# Patient Record
Sex: Male | Born: 1950
Health system: Southern US, Community
[De-identification: ages and names within clinical notes are randomized; demographics above are authoritative.]

## PROBLEM LIST (undated history)

## (undated) DIAGNOSIS — M5416 Radiculopathy, lumbar region: Secondary | ICD-10-CM

## (undated) DIAGNOSIS — N528 Other male erectile dysfunction: Secondary | ICD-10-CM

## (undated) DIAGNOSIS — N410 Acute prostatitis: Secondary | ICD-10-CM

## (undated) DIAGNOSIS — R7402 Elevation of levels of lactic acid dehydrogenase (LDH): Secondary | ICD-10-CM

## (undated) DIAGNOSIS — N400 Enlarged prostate without lower urinary tract symptoms: Secondary | ICD-10-CM

## (undated) DIAGNOSIS — N138 Other obstructive and reflux uropathy: Secondary | ICD-10-CM

## (undated) DIAGNOSIS — R7401 Elevation of levels of liver transaminase levels: Secondary | ICD-10-CM

## (undated) DIAGNOSIS — E785 Hyperlipidemia, unspecified: Secondary | ICD-10-CM

## (undated) DIAGNOSIS — R634 Abnormal weight loss: Secondary | ICD-10-CM

## (undated) DIAGNOSIS — M545 Low back pain, unspecified: Secondary | ICD-10-CM

## (undated) DIAGNOSIS — K635 Polyp of colon: Secondary | ICD-10-CM

## (undated) DIAGNOSIS — J019 Acute sinusitis, unspecified: Secondary | ICD-10-CM

## (undated) DIAGNOSIS — K219 Gastro-esophageal reflux disease without esophagitis: Secondary | ICD-10-CM

## (undated) DIAGNOSIS — K5904 Chronic idiopathic constipation: Secondary | ICD-10-CM

## (undated) DIAGNOSIS — K2100 Gastro-esophageal reflux disease with esophagitis, without bleeding: Secondary | ICD-10-CM

## (undated) DIAGNOSIS — K432 Incisional hernia without obstruction or gangrene: Secondary | ICD-10-CM

## (undated) DIAGNOSIS — R21 Rash and other nonspecific skin eruption: Secondary | ICD-10-CM

## (undated) HISTORY — DX: Elevation of levels of liver transaminase levels: R74.01

## (undated) HISTORY — DX: Other male erectile dysfunction: N52.8

## (undated) HISTORY — PX: HAND SURGERY: SHX662

## (undated) HISTORY — DX: Chronic idiopathic constipation: K59.04

## (undated) HISTORY — DX: Abnormal weight loss: R63.4

## (undated) HISTORY — DX: Gastro-esophageal reflux disease with esophagitis, without bleeding: K21.00

## (undated) HISTORY — DX: Hyperlipidemia, unspecified: E78.5

## (undated) HISTORY — DX: Elevation of levels of lactic acid dehydrogenase (LDH): R74.02

## (undated) HISTORY — DX: Rash and other nonspecific skin eruption: R21

## (undated) HISTORY — DX: Acute sinusitis, unspecified: J01.90

## (undated) HISTORY — DX: Radiculopathy, lumbar region: M54.16

## (undated) HISTORY — DX: Low back pain, unspecified: M54.50

## (undated) HISTORY — DX: Other obstructive and reflux uropathy: N13.8

## (undated) HISTORY — PX: SPINAL CORD DECOMPRESSION: SHX97

## (undated) HISTORY — DX: Acute prostatitis: N41.0

## (undated) HISTORY — DX: Incisional hernia without obstruction or gangrene: K43.2

---
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
## 1998-07-17 ENCOUNTER — Emergency Department (HOSPITAL_COMMUNITY): Admission: EM | Admit: 1998-07-17 | Discharge: 1998-07-17 | Payer: Self-pay | Admitting: Emergency Medicine

## 2002-01-25 ENCOUNTER — Emergency Department (HOSPITAL_COMMUNITY): Admission: EM | Admit: 2002-01-25 | Discharge: 2002-01-25 | Payer: Self-pay | Admitting: Internal Medicine

## 2002-01-25 ENCOUNTER — Encounter: Payer: Self-pay | Admitting: Internal Medicine

## 2002-01-31 ENCOUNTER — Ambulatory Visit (HOSPITAL_COMMUNITY): Admission: RE | Admit: 2002-01-31 | Discharge: 2002-01-31 | Payer: Self-pay | Admitting: Pulmonary Disease

## 2002-01-31 IMAGING — CT CT ABDOMEN W/ CM
1 of 2 series · 15 of 32 positions shown, 20 images · IV contrast (CONTRAST)
Comparison: none

FINDINGS
CLINICAL DATA: RIGHT-SIDED ABDOMINAL PAIN
CT ABDOMEN WITH IV CONTRAST
SCANS WERE OBTAINED DURING IV CONTRAST ENHANCEMENT WITH 100 CC OF OMNIPAQUE 300 INJECTED AT 2
CC/SEC.
THERE ARE SEVERAL SMALL (5-6 MM IN SIZE) RIGHT LOBE HEPATIC CYSTS.  THE LIVER OTHERWISE HAS A
NORMAL APPEARANCE.  THE GALLBLADDER AS VISUALIZED BY CT HAS A NORMAL APPEARANCE.  THE PANCREAS,
SPLEEN, KIDNEYS, AND ADRENAL GLANDS HAVE A NORMAL APPEARANCE.  THERE IS NO RETROPERITONEAL
ADENOPATHY.  CONSIDERABLE STOOL IS SEEN WITHIN THE COLON.
IMPRESSION
SEVERAL SMALL RIGHT LOBE HEPATIC CYSTS.  OTHERWISE NORMAL STUDY AND NO FINDINGS TO ACCOUNT FOR THE
PATIENT'S ABDOMINAL PAIN.
CT PELVIS WITH IV CONTRAST
THERE IS NO EVIDENCE FOR A PELVIC MASS.  THERE IS NO ADENOPATHY.  THERE IS NO FREE PELVIC FLUID.
THE APPENDIX IS DIFFICULT TO VISUALIZE WITH CERTAINTY.  HOWEVER, THERE IS NO CT SCAN EVIDENCE FOR
APPENDICITIS.
NEGATIVE CT OF THE PELVIS.

[Series 8783: — · axial · 0.66mm/px · z∈[+1450,+1810]mm · 15 of 80 slices shown, 20 images]
[im 4/80  soft-tissue]
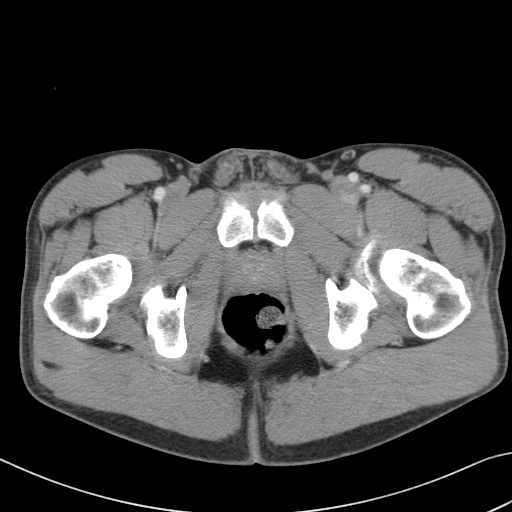
[im 4/80  bone]
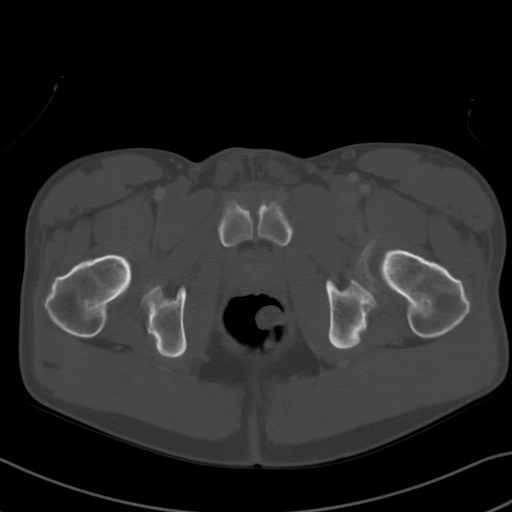
[im 11/80  soft-tissue]
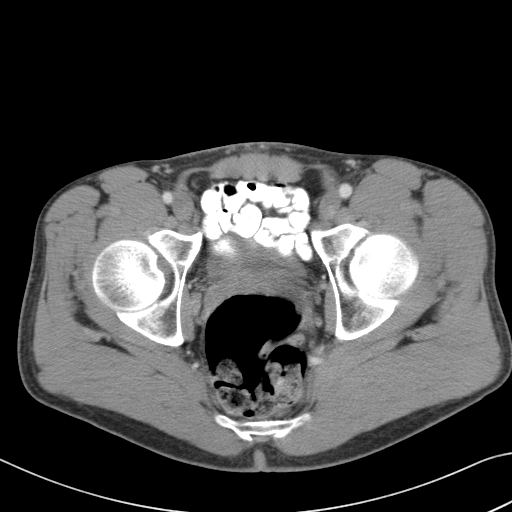
[im 15/80  soft-tissue]
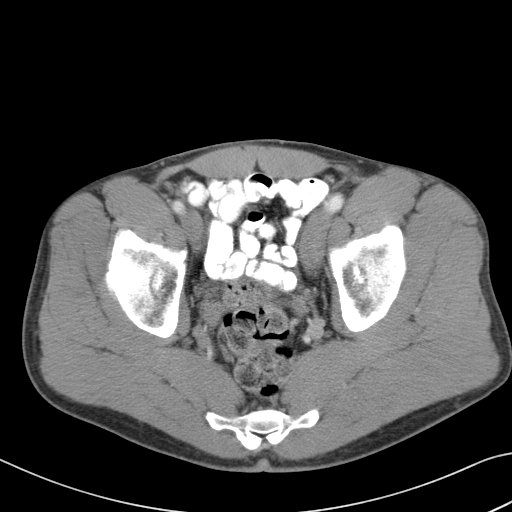
[im 22/80  soft-tissue]
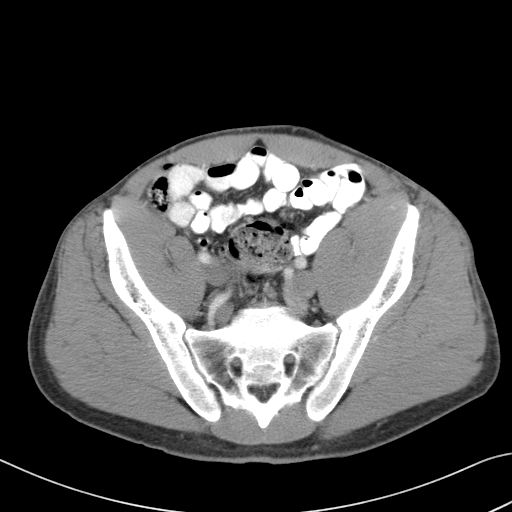
[im 26/80  soft-tissue]
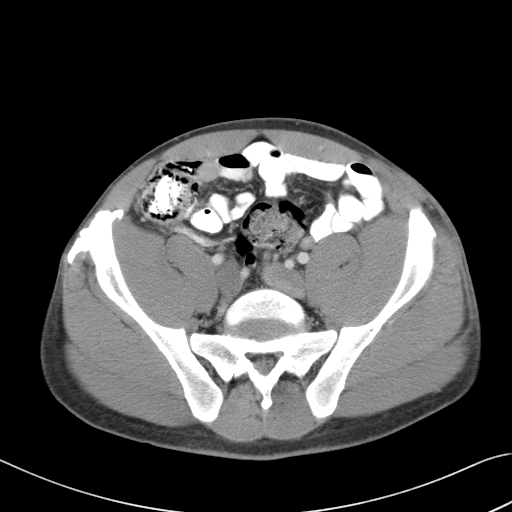
[im 33/80  soft-tissue]
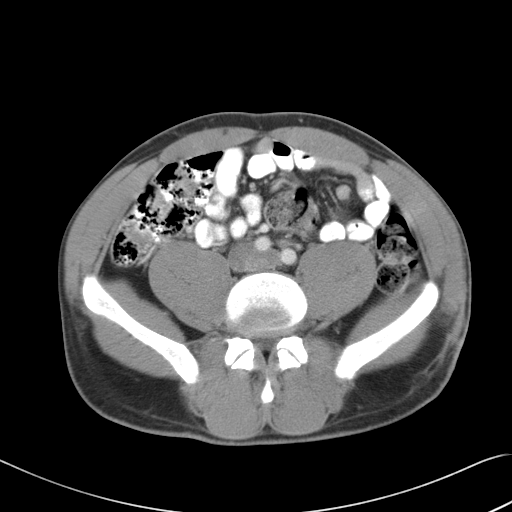
[im 36/80  soft-tissue]
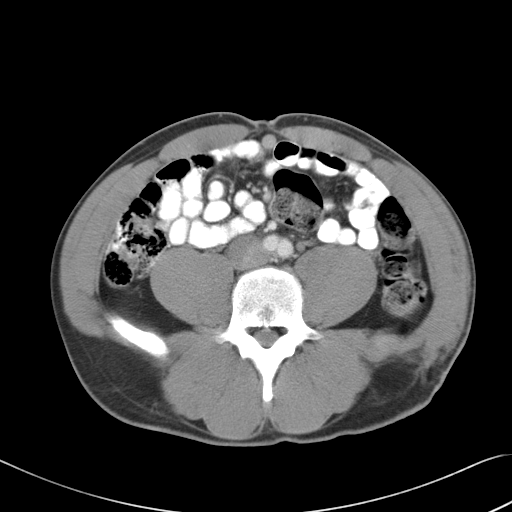
[im 44/80  soft-tissue]
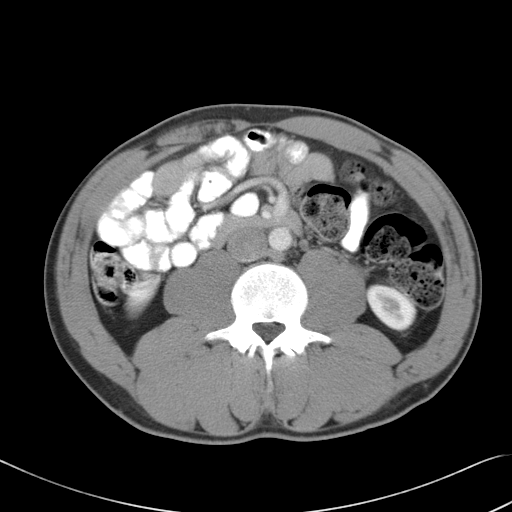
[im 47/80  soft-tissue]
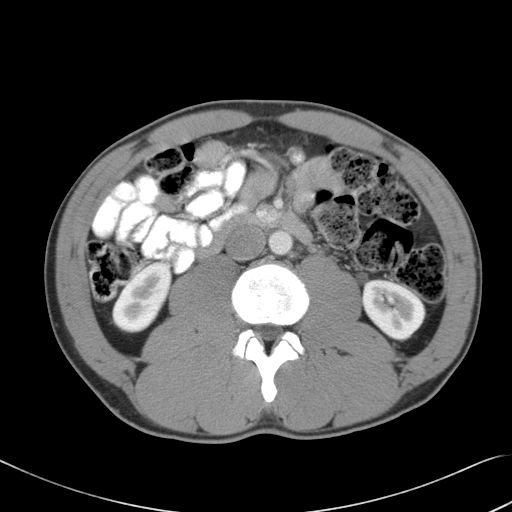
[im 47/80  bone]
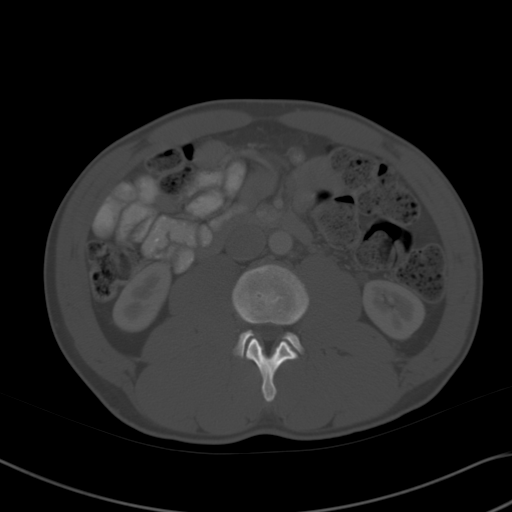
[im 54/80  soft-tissue]
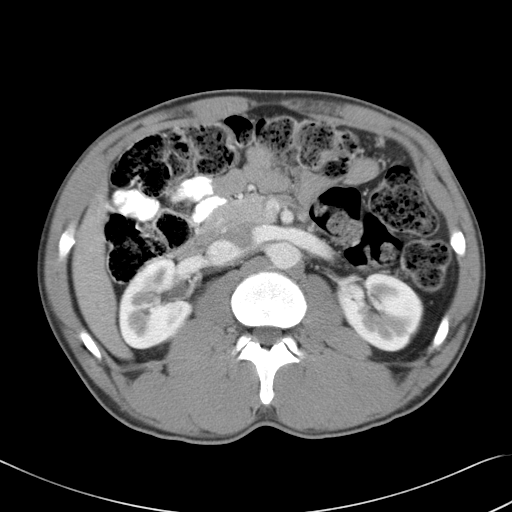
[im 58/80  soft-tissue]
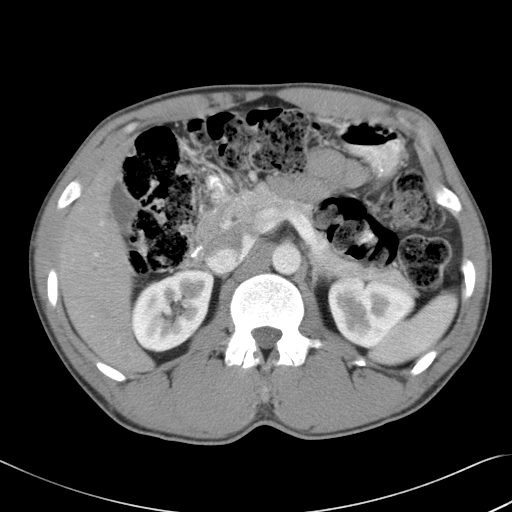
[im 65/80  soft-tissue]
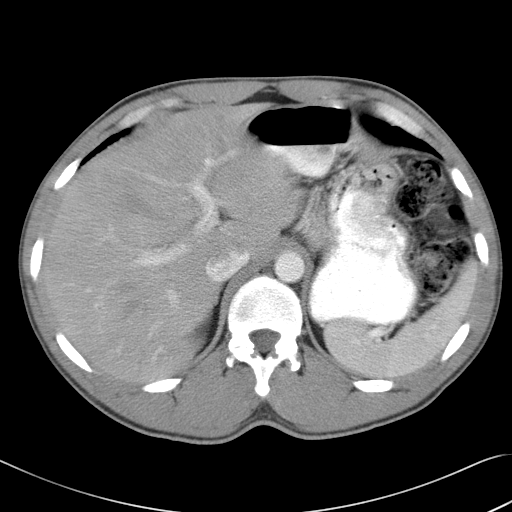
[im 65/80  lung]
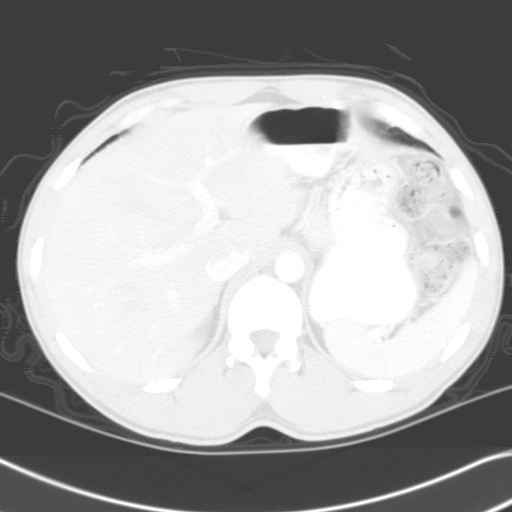
[im 69/80  soft-tissue]
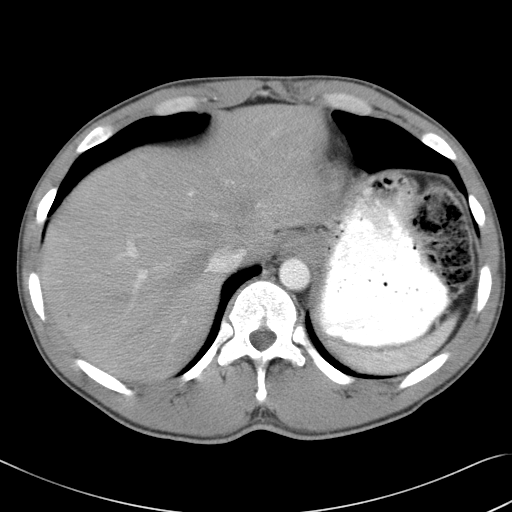
[im 69/80  lung]
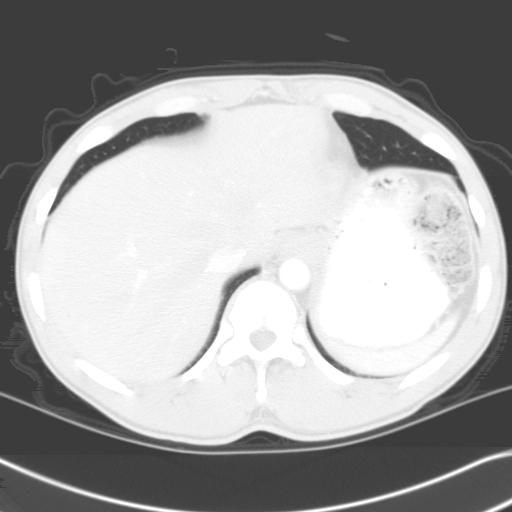
[im 72/80  lung]
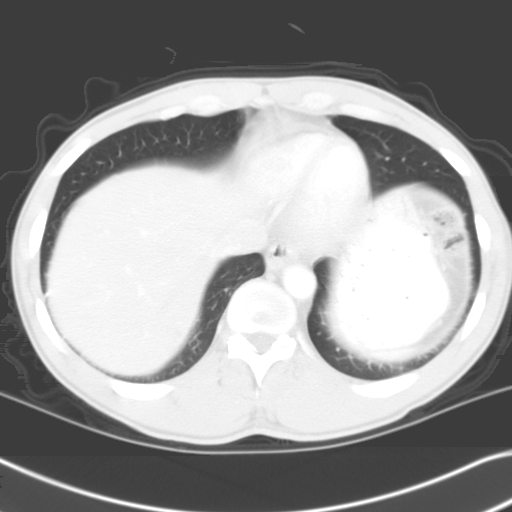
[im 76/80  soft-tissue]
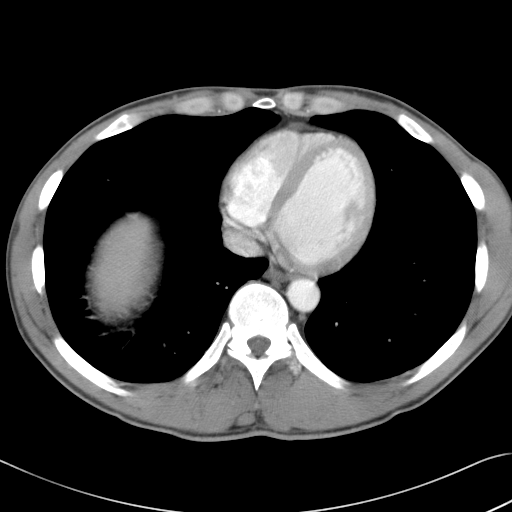
[im 76/80  lung]
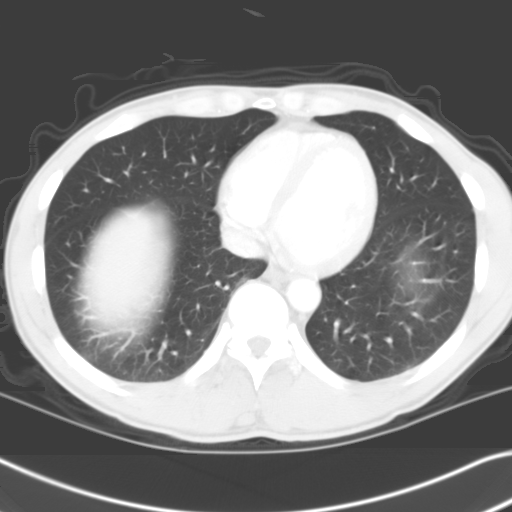

[15 of 32 positions shown; findings below may reference images not displayed]

## 2002-02-11 ENCOUNTER — Emergency Department (HOSPITAL_COMMUNITY): Admission: EM | Admit: 2002-02-11 | Discharge: 2002-02-11 | Payer: Self-pay

## 2004-11-18 ENCOUNTER — Ambulatory Visit (HOSPITAL_COMMUNITY): Admission: RE | Admit: 2004-11-18 | Discharge: 2004-11-18 | Payer: Self-pay | Admitting: Pulmonary Disease

## 2004-11-18 IMAGING — US US ABDOMEN COMPLETE
1 series · 14 of 25 positions shown · non-contrast
Comparison: none

HISTORY: Abdominal pain

ULTRASOUND ABDOMEN COMPLETE:
Gallbladder normal without stones or wall thickening.
Common bile duct normal caliber, 4 mm diameter.
Liver, pancreas, and spleen normal appearance.
Kidneys normal appearance, right 10 cm length and left 9.5 cm.
Aorta and IVC normal.
No ascites.

[Series 1: unknown · 0.34mm/px · 14 of 72 slices shown]
[im 1/72]
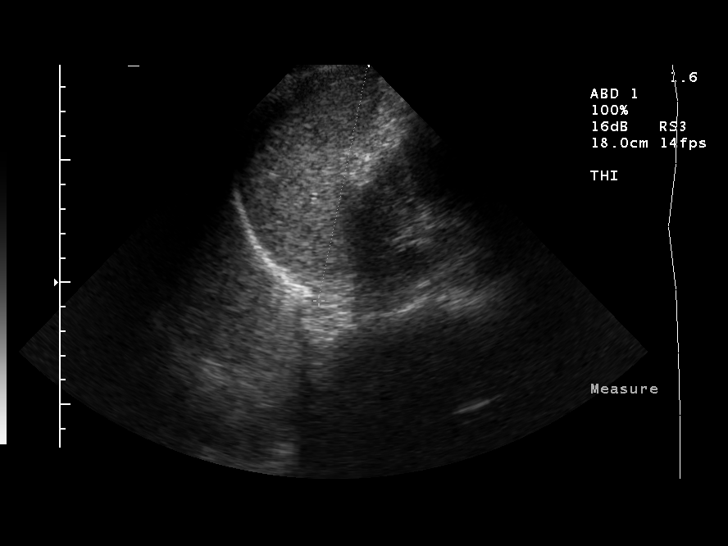
[im 6/72]
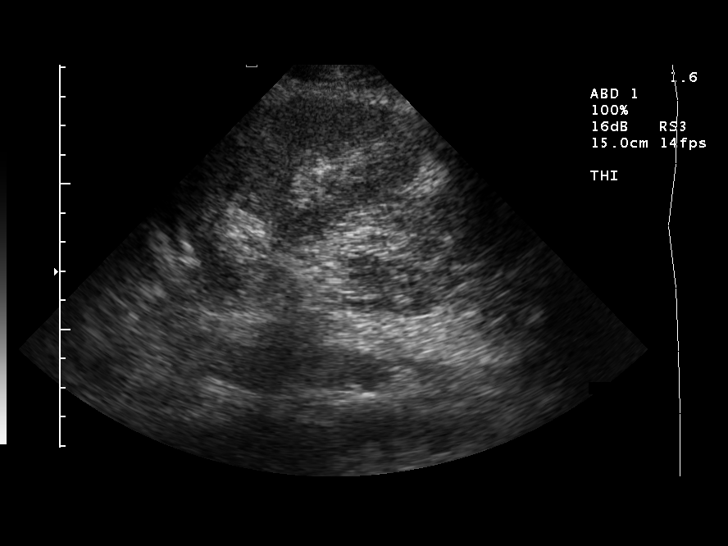
[im 12/72]
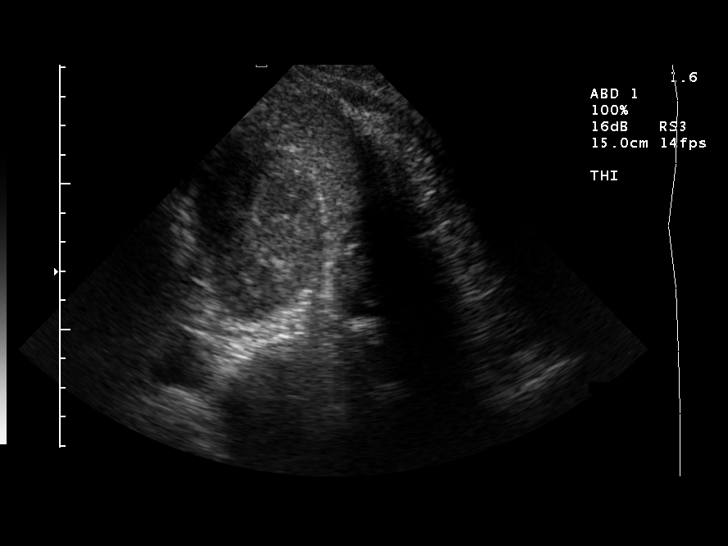
[im 18/72]
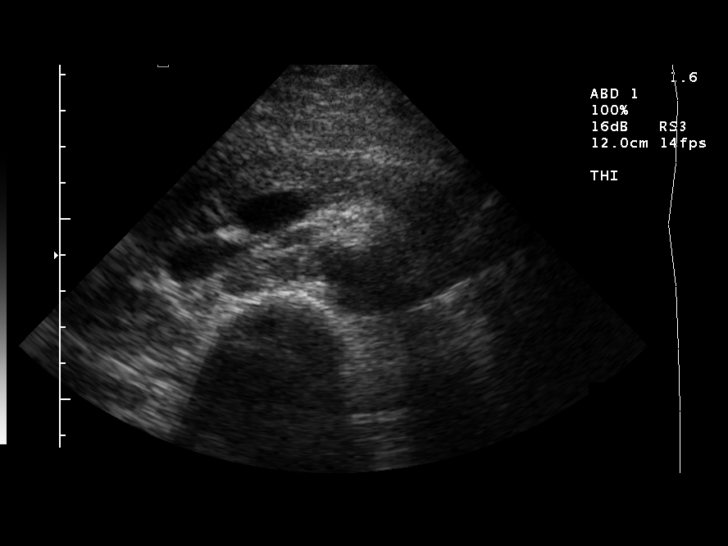
[im 24/72]
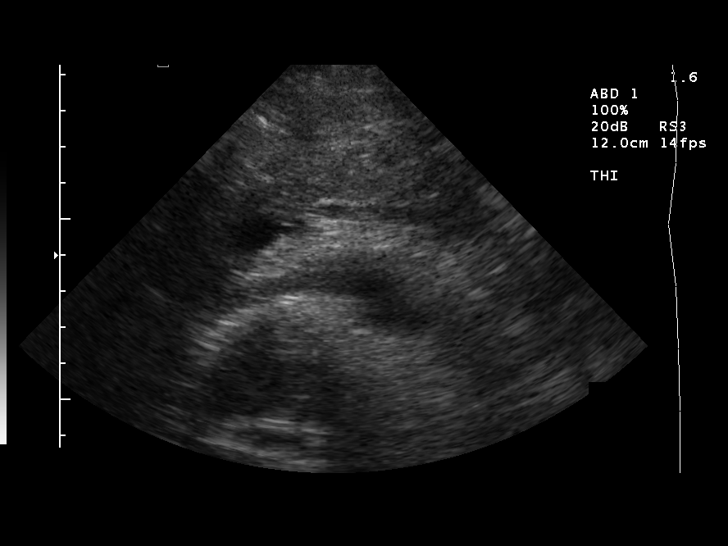
[im 27/72]
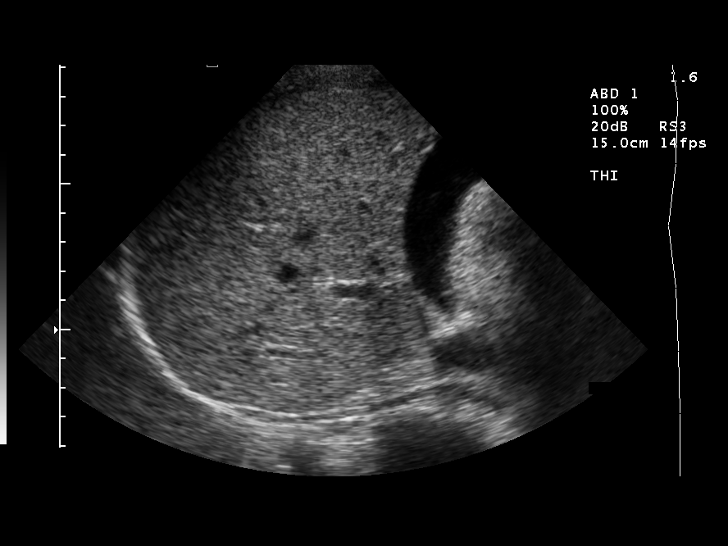
[im 33/72]
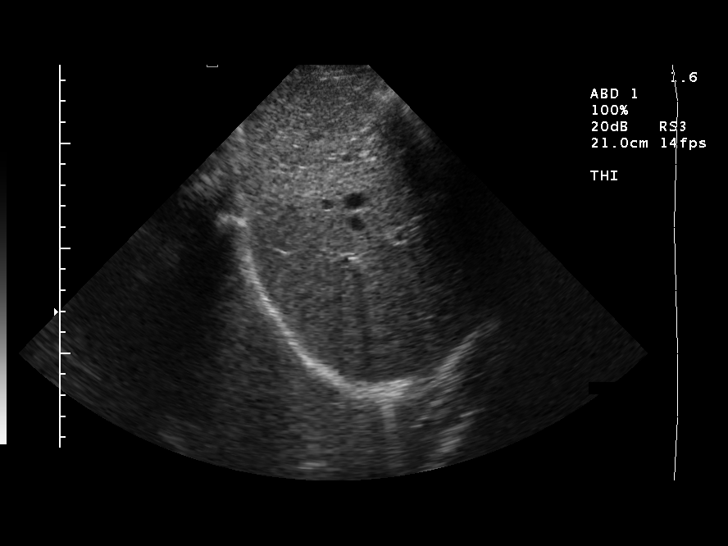
[im 39/72]
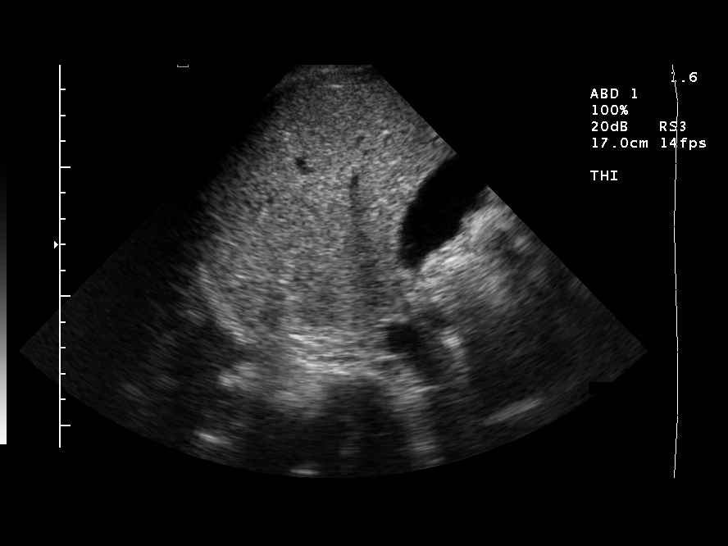
[im 45/72]
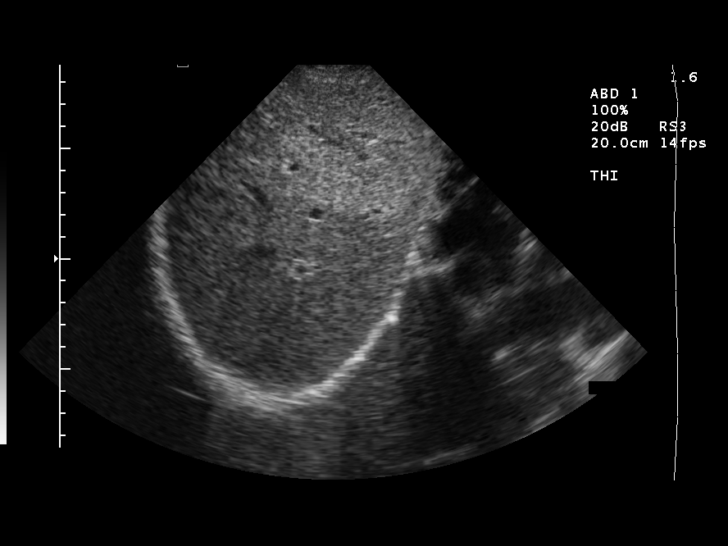
[im 48/72]
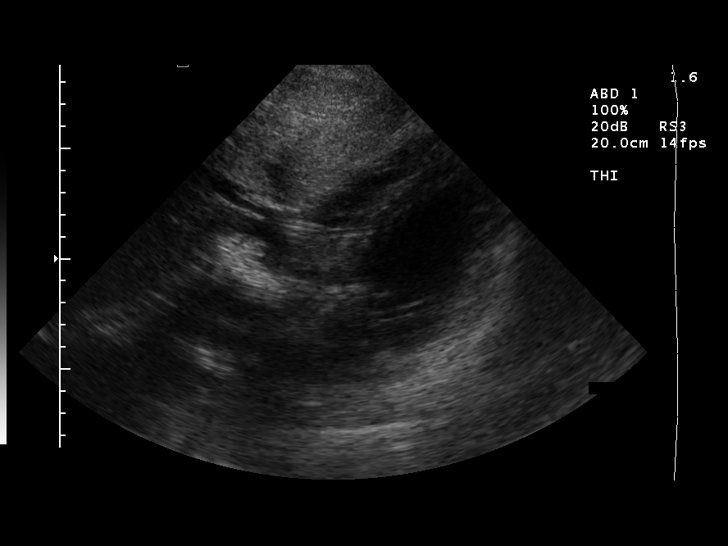
[im 54/72]
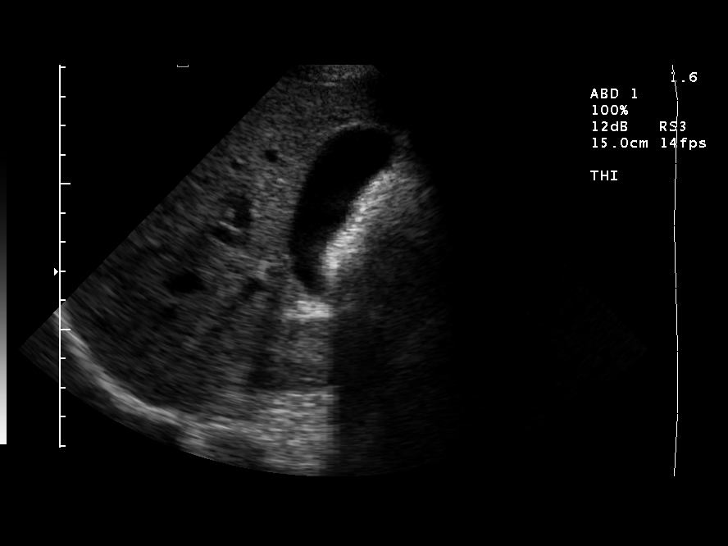
[im 60/72]
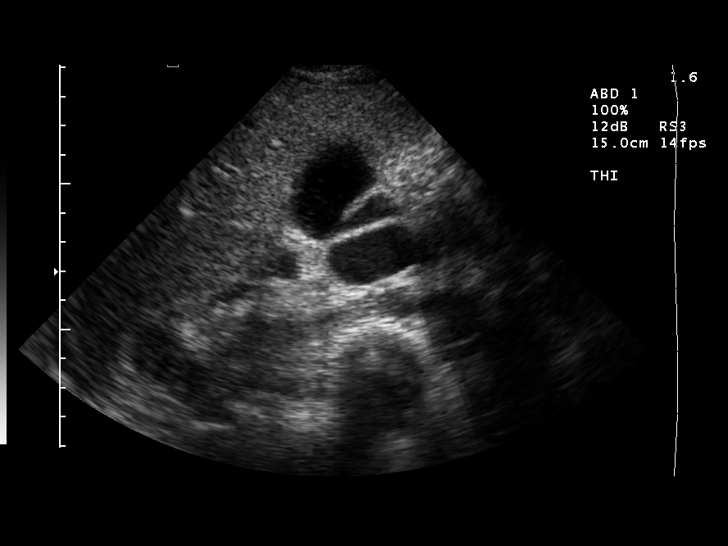
[im 66/72]
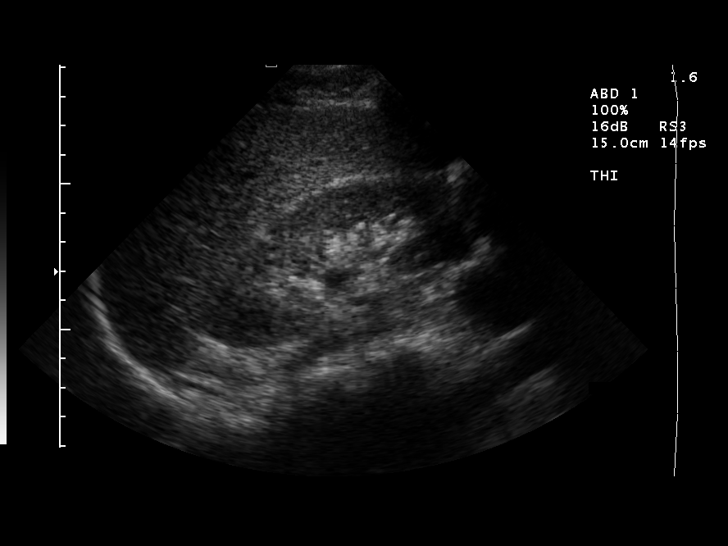
[im 72/72]
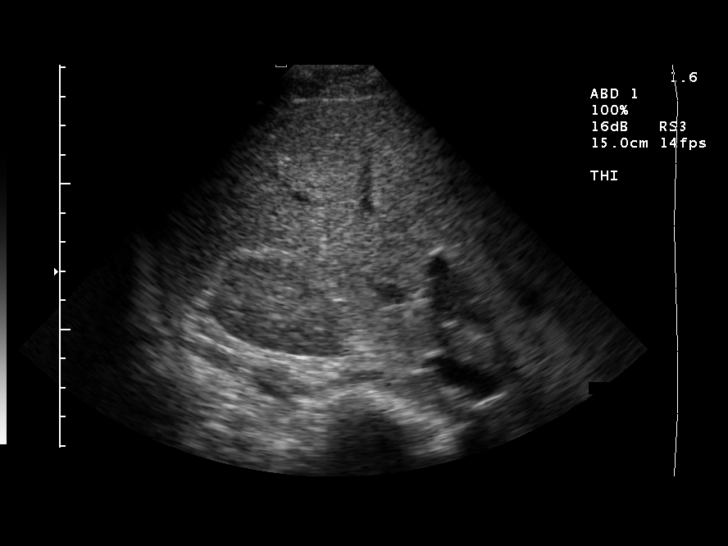

[14 of 25 positions shown; findings below may reference images not displayed]

IMPRESSION: Normal ultrasound abdomen.

## 2005-05-29 ENCOUNTER — Ambulatory Visit (HOSPITAL_COMMUNITY): Admission: RE | Admit: 2005-05-29 | Discharge: 2005-05-29 | Payer: Self-pay | Admitting: Pulmonary Disease

## 2005-05-29 IMAGING — US US SCROTUM
1 series · 14 of 25 positions shown · non-contrast
Comparison: none

CLINICAL DATA: Testicular pain and swelling. 
 ULTRASOUND OF THE SCROTUM AND ULTRASOUND ARTERIAL VENOUS FLOW DOPPLER:

[Series 1: unknown · 0.09mm/px · 14 of 54 slices shown]
[im 1/54]
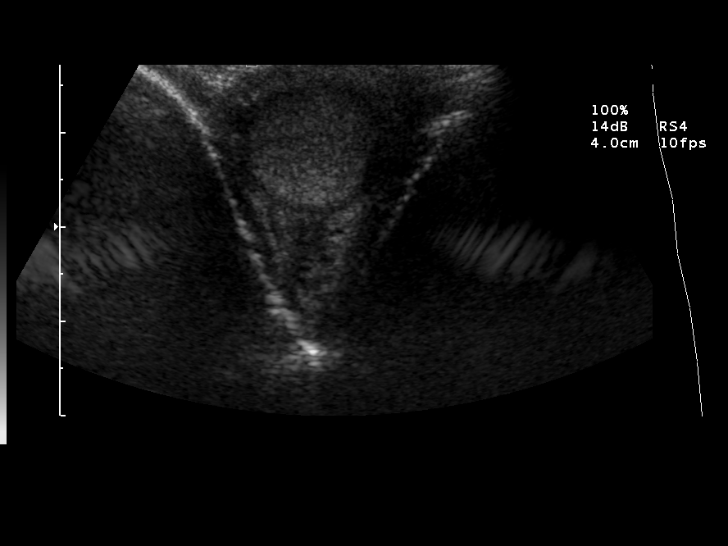
[im 5/54]
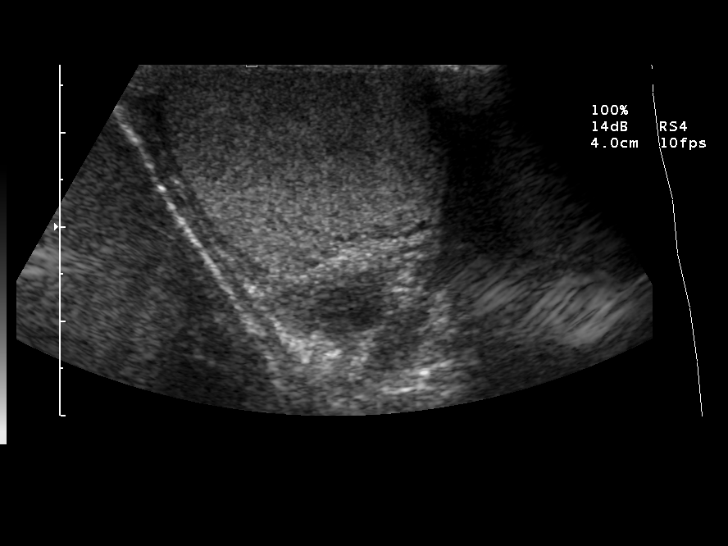
[im 9/54]
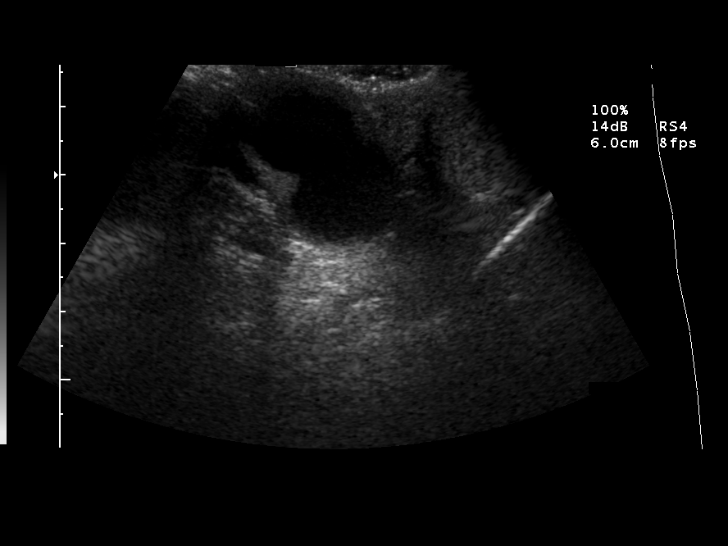
[im 14/54]
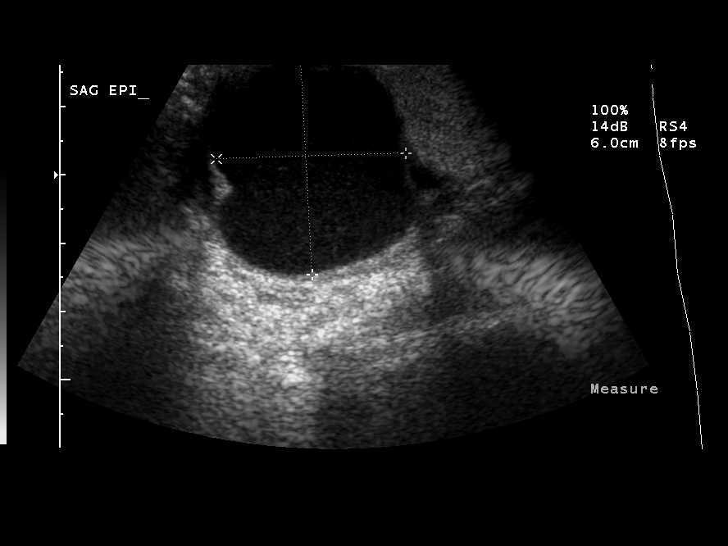
[im 18/54]
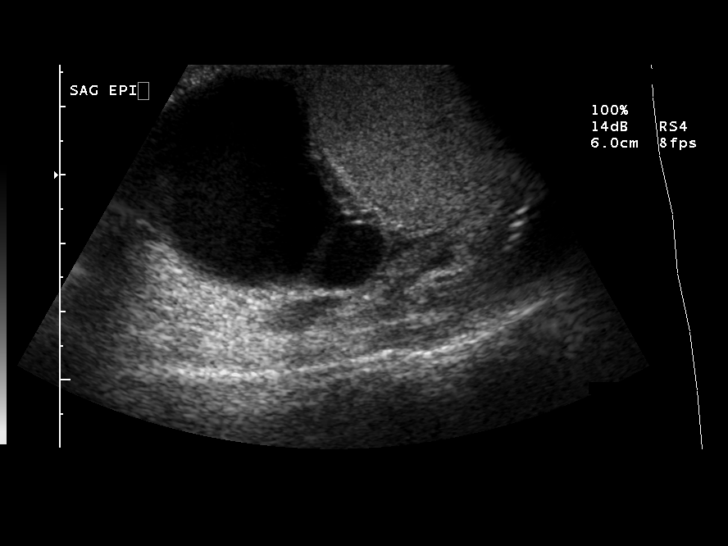
[im 20/54]
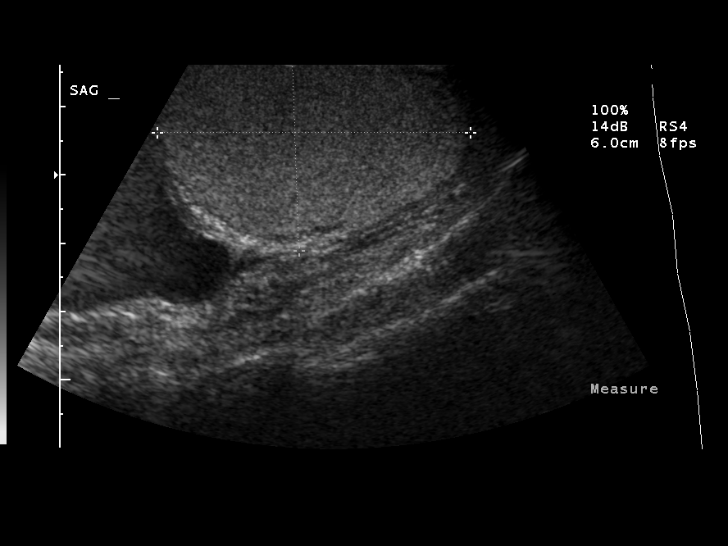
[im 25/54]
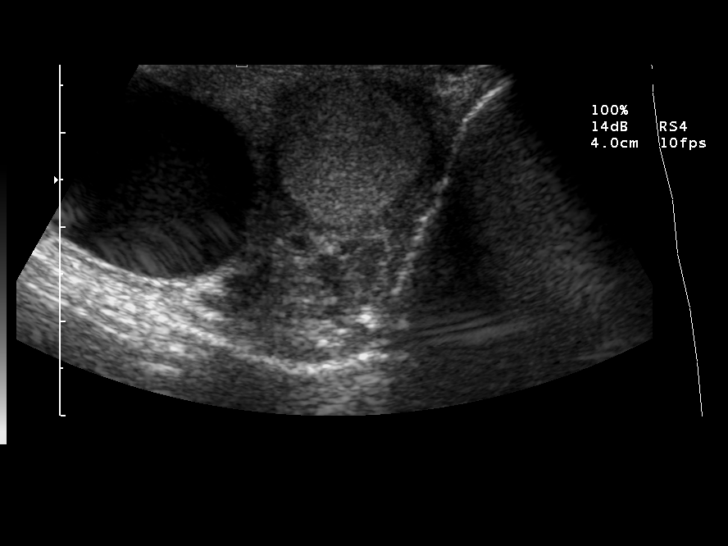
[im 29/54]
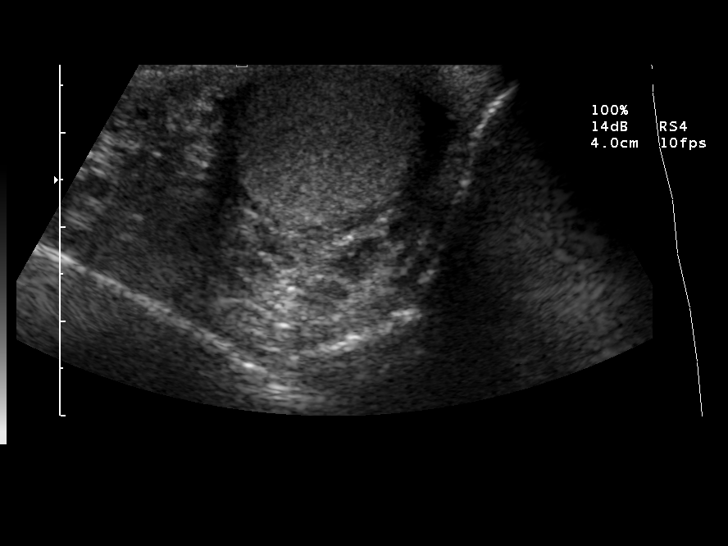
[im 34/54]
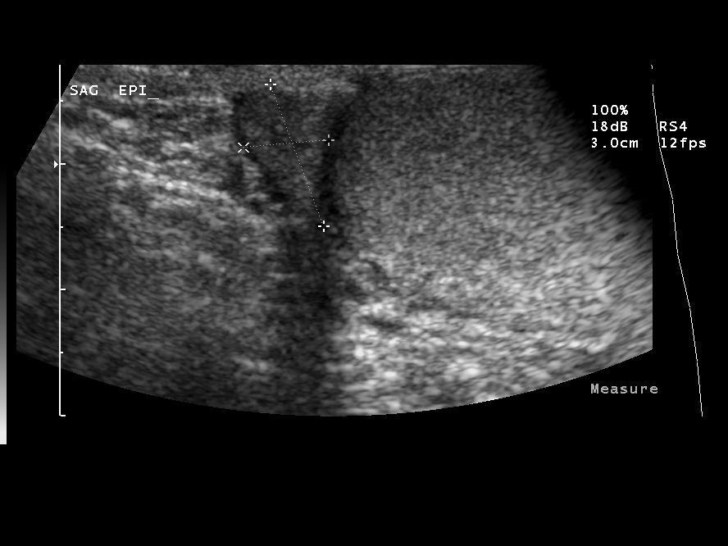
[im 36/54]
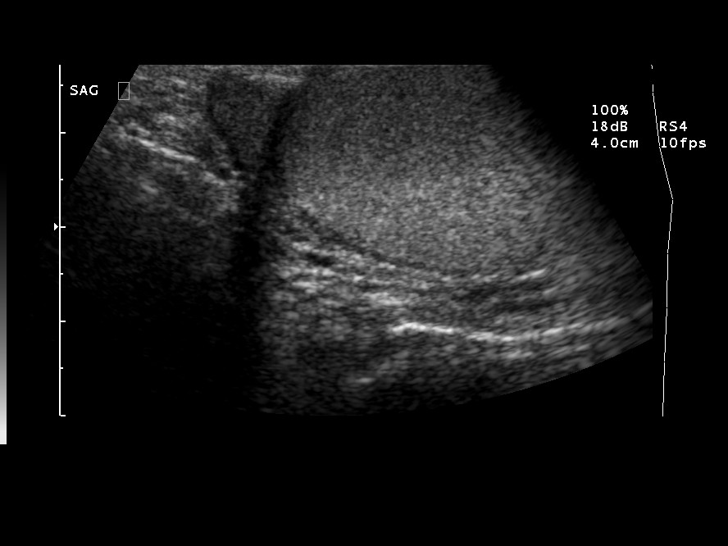
[im 40/54]
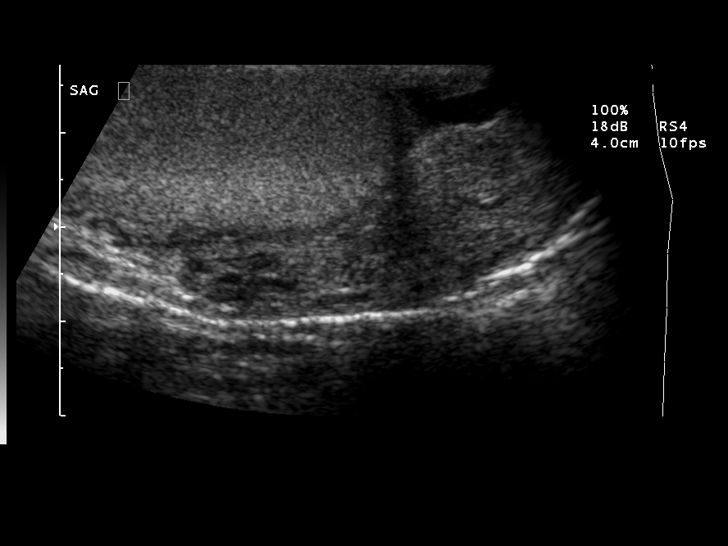
[im 45/54]
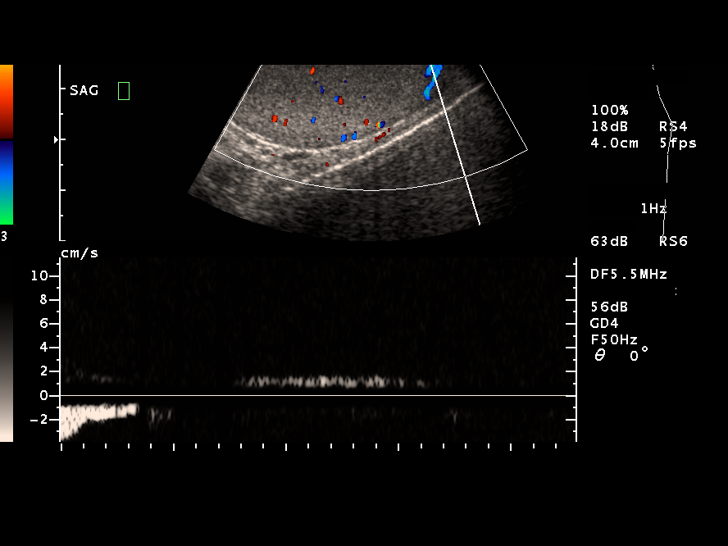
[im 49/54]
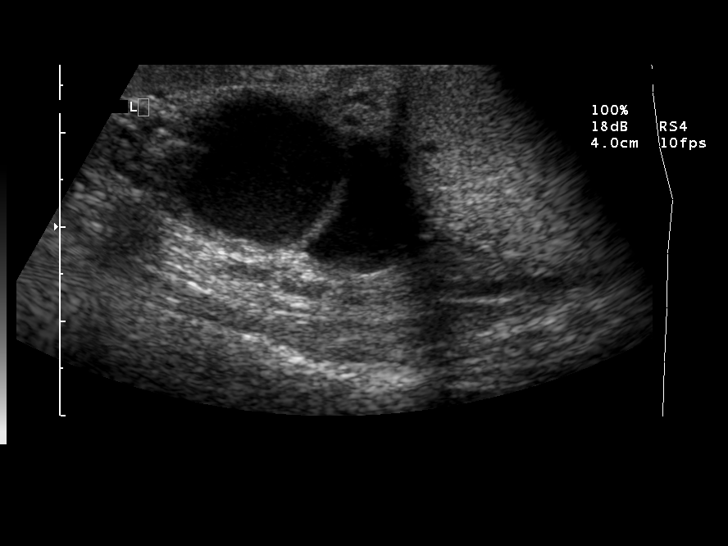
[im 54/54]
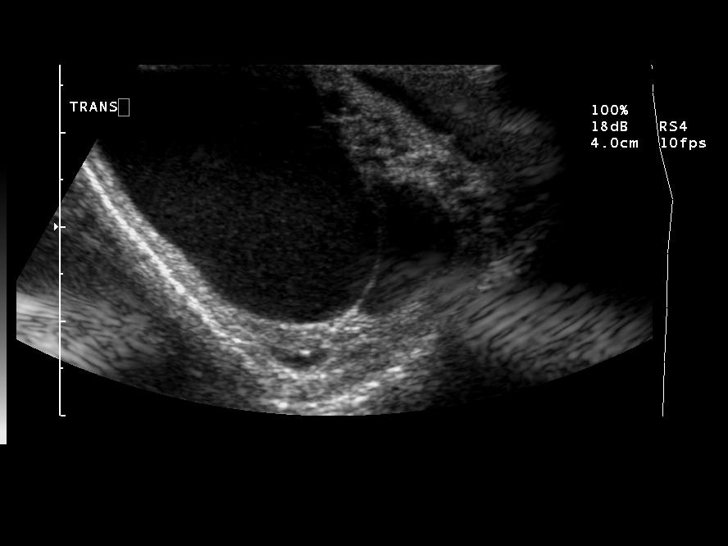

[14 of 25 positions shown; findings below may reference images not displayed]

FINDINGS: The testicles are normal in size and echogenicity.  The right testicle measures 4.6 cm sagittally with a depth of 2.8 cm and a width of 3.6 cm.  The left testicle measures 4.9 x 2.2 x 2.8 cm.  There is blood flow to both testicles. Normal arterial and venous waveforms are present bilaterally.  There are hypoechoic structures noted near the right epididymis, some internal echogenicity.  The largest of these measures 3.2 x 2.8 x 2.7 cm.  Although these could represent epididymal cysts, the internal echogenicity may indicate spermatoceles.  The left epididymis appears normal.  No hydrocele or varicocele is seen.
IMPRESSION: 1.  No intratesticular abnormality.  Blood flow is noted to both testicles.  
 2.  Hypoechoic structures in the right epididymis consistent with either epididymal cysts or spermatoceles as noted above.

## 2005-10-27 ENCOUNTER — Ambulatory Visit (HOSPITAL_COMMUNITY): Admission: RE | Admit: 2005-10-27 | Discharge: 2005-10-27 | Payer: Self-pay | Admitting: Pulmonary Disease

## 2005-10-27 IMAGING — CT CT ABDOMEN W/ CM
1 of 3 series · 14 of 32 positions shown, 19 images · IV contrast (CONTRAST)
Comparison: none

HISTORY: Abdominal pain

[Series 5682: — · axial · 0.70mm/px · z∈[+1248,+1638]mm · 14 of 90 slices shown, 19 images]
[im 6/90  soft-tissue]
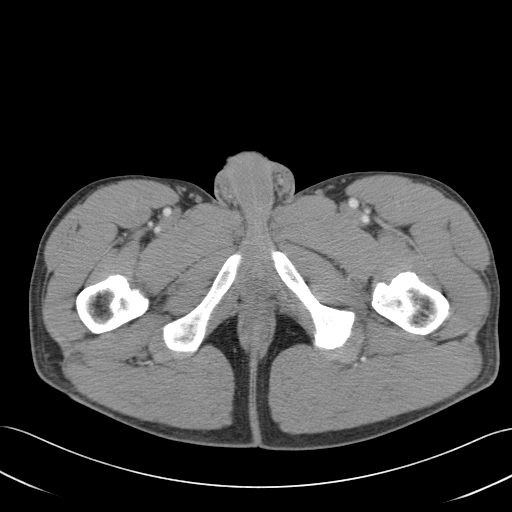
[im 6/90  bone]
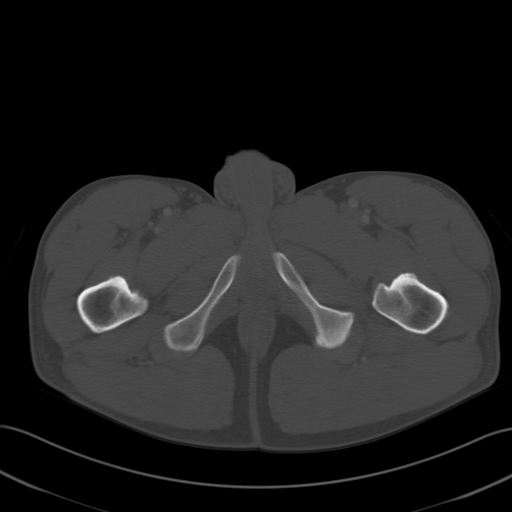
[im 12/90  soft-tissue]
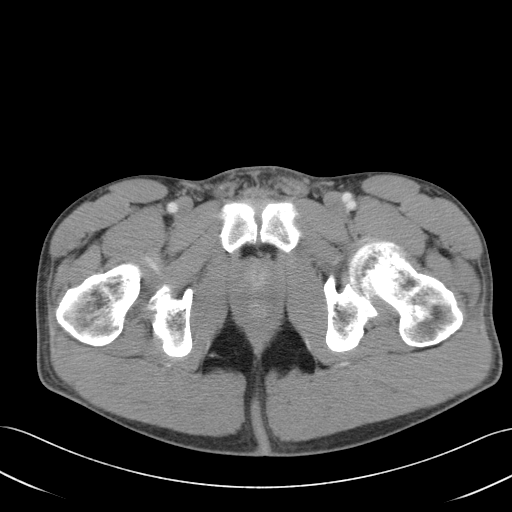
[im 17/90  soft-tissue]
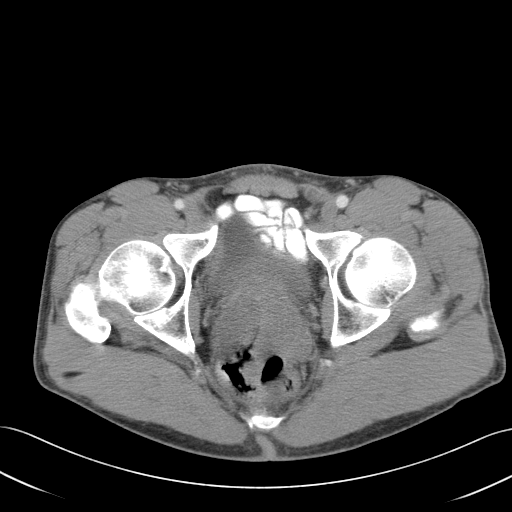
[im 28/90  soft-tissue]
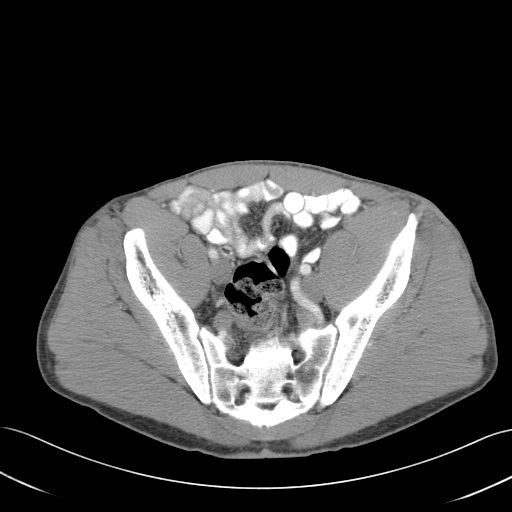
[im 34/90  soft-tissue]
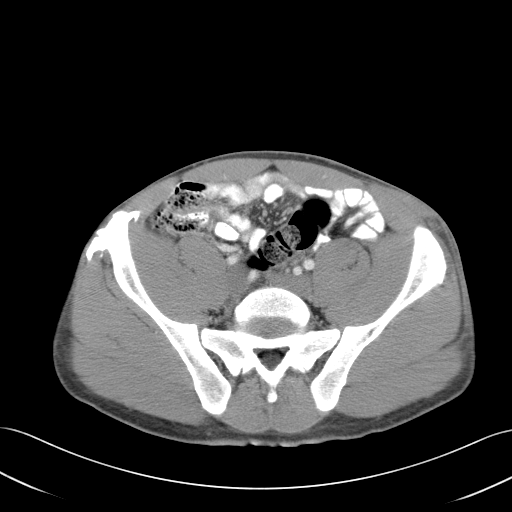
[im 39/90  soft-tissue]
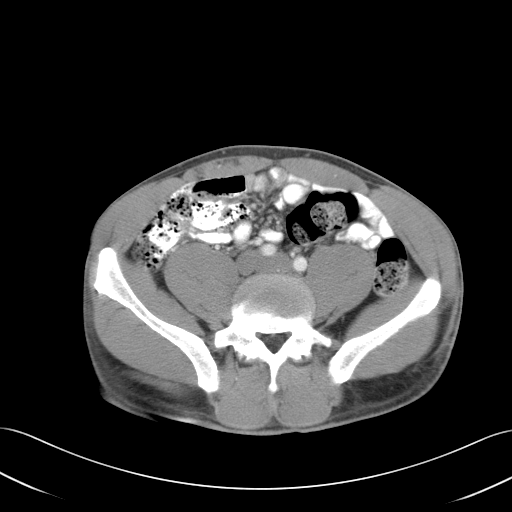
[im 45/90  soft-tissue]
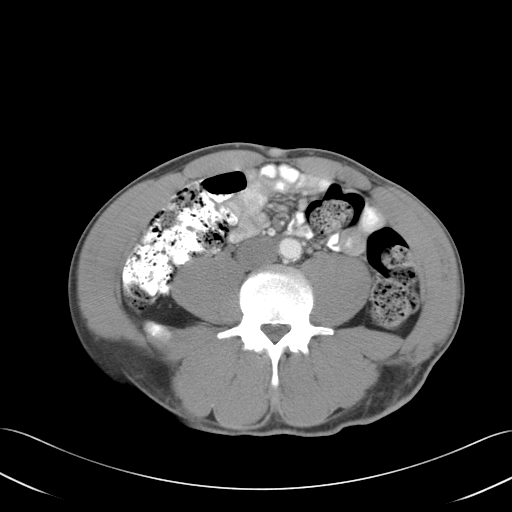
[im 51/90  soft-tissue]
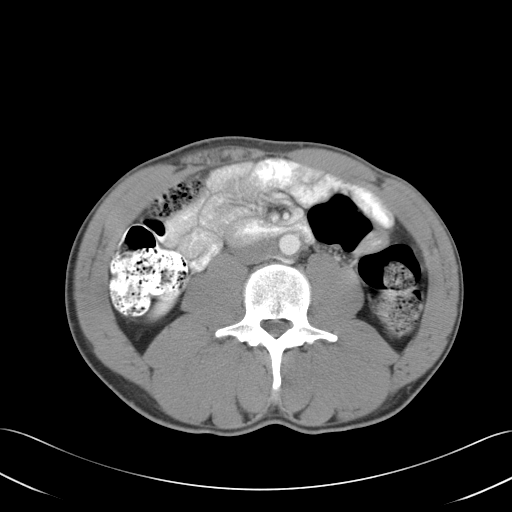
[im 56/90  soft-tissue]
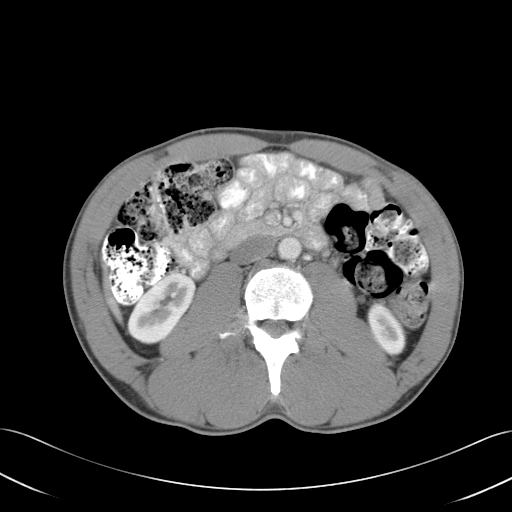
[im 56/90  bone]
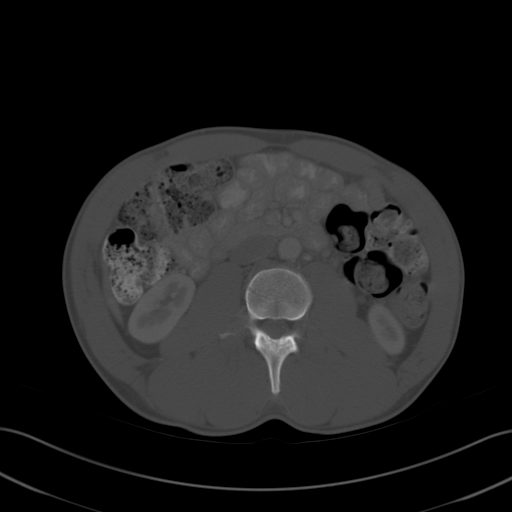
[im 62/90  soft-tissue]
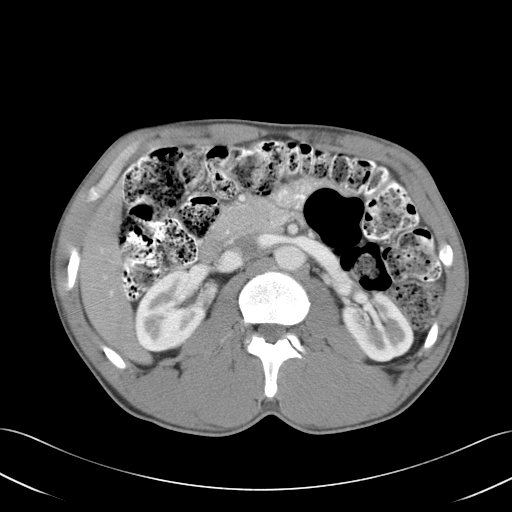
[im 67/90  lung]
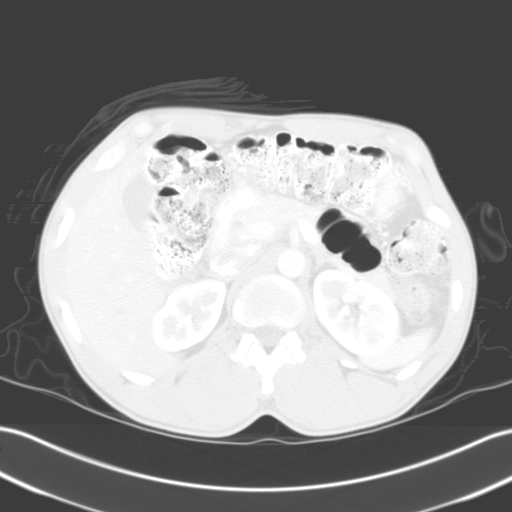
[im 73/90  soft-tissue]
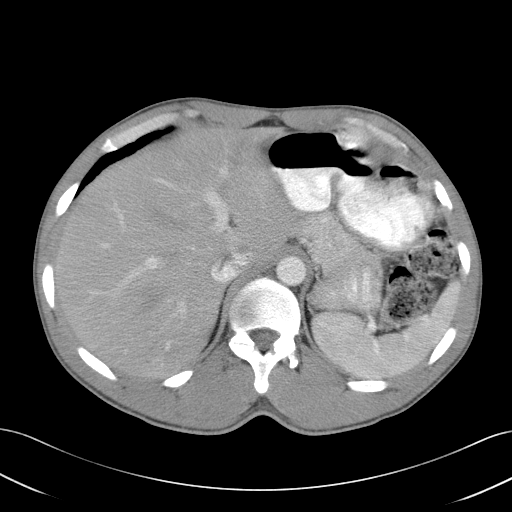
[im 73/90  lung]
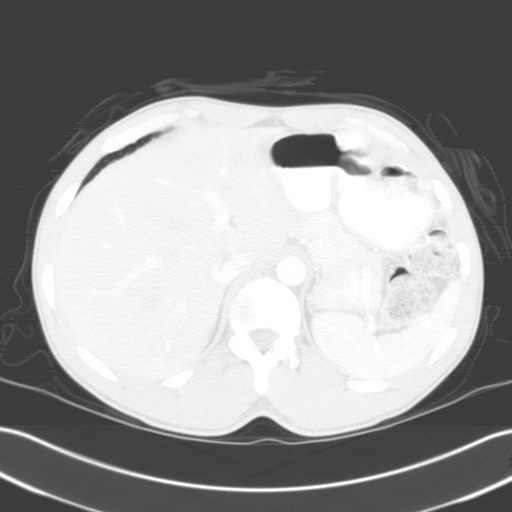
[im 78/90  soft-tissue]
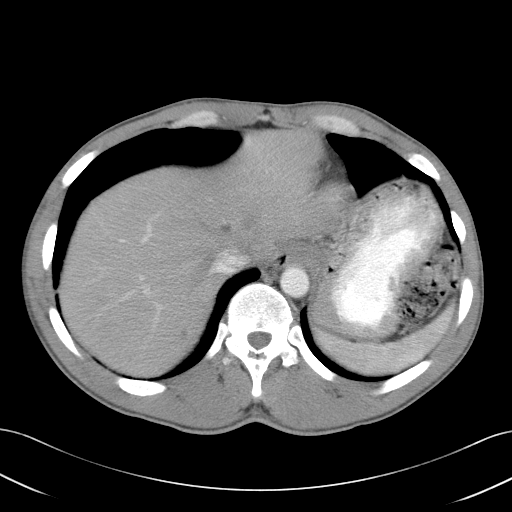
[im 78/90  lung]
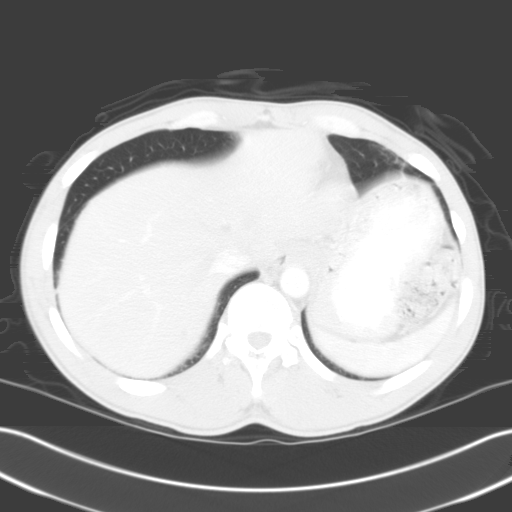
[im 84/90  soft-tissue]
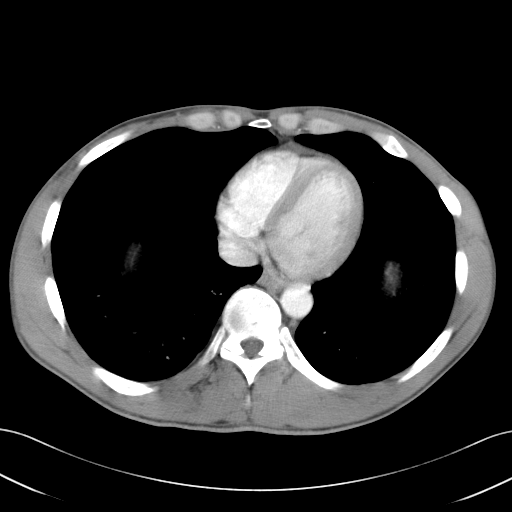
[im 84/90  lung]
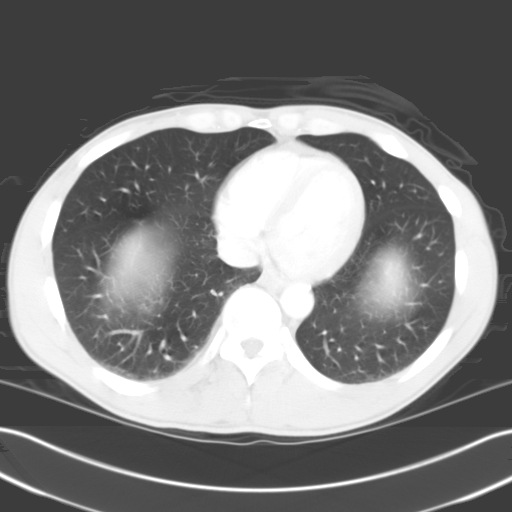

[14 of 32 positions shown; findings below may reference images not displayed]

CT ABDOMEN AND PELVIS WITH CONTRAST:

Multidetector helical CT imaging abdomen and pelvis performed.
Exam utilized dilute oral contrast and 100 cc [3I].
Comparison to [DATE]

CT ABDOMEN:

Lung bases clear.
Tiny nonspecific low attenuation foci liver unchanged.
Remainder of liver, spleen, pancreas, kidneys, and adrenal glands normal.
No upper abdominal mass, adenopathy, free fluid, or inflammatory process.
Circumaortic left renal vein.
Mild constipation.
IMPRESSION: Stable tiny nonspecific low attenuation foci liver.
Constipation.

CT PELVIS:

Small bowel loops unremarkable.
Question very small amount of nonspecific free pelvic fluid.
No inguinal hernia. 
Mild prostatic enlargement, gland measuring 5.2 x 3.6 cm image 77.
Enlargement of bilateral seminal vesicles, slightly indistinct margins, question
inflammatory or obstructive.
Mild thickening of mid to inferior rectal wall.
No acute bone lesions.
Spur formation anterolaterally right ilium, question posttraumatic.
IMPRESSION: Question mild rectal wall thickening, which could be secondary to infection,
inflammatory bowel disease, trauma.
Interval enlargement prostate gland and bilateral seminal vesicles question
related to prostatitis and inflammation though seminal vesicle obstruction could
have similar appearance.
Constipation.

## 2005-12-29 ENCOUNTER — Ambulatory Visit (HOSPITAL_COMMUNITY): Admission: RE | Admit: 2005-12-29 | Discharge: 2005-12-29 | Payer: Self-pay | Admitting: Internal Medicine

## 2005-12-29 ENCOUNTER — Ambulatory Visit: Payer: Self-pay | Admitting: Internal Medicine

## 2005-12-29 ENCOUNTER — Encounter: Payer: Self-pay | Admitting: Internal Medicine

## 2005-12-29 HISTORY — PX: COLONOSCOPY: SHX174

## 2005-12-29 HISTORY — PX: ESOPHAGOGASTRODUODENOSCOPY: SHX1529

## 2005-12-31 ENCOUNTER — Ambulatory Visit (HOSPITAL_COMMUNITY): Admission: RE | Admit: 2005-12-31 | Discharge: 2005-12-31 | Payer: Self-pay | Admitting: Internal Medicine

## 2005-12-31 IMAGING — US US ABDOMEN COMPLETE
1 series · 14 of 25 positions shown · non-contrast
Comparison: none

CLINICAL DATA: Epigastric pain

[Series 1: unknown · 0.34mm/px · 14 of 72 slices shown]
[im 1/72]
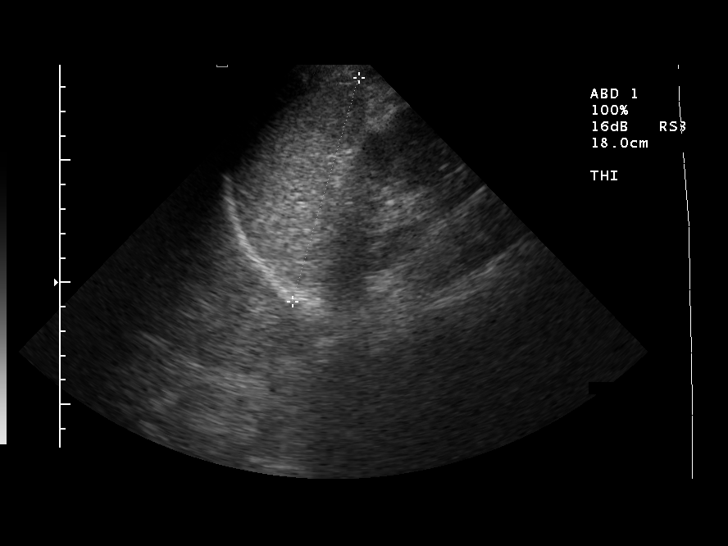
[im 6/72]
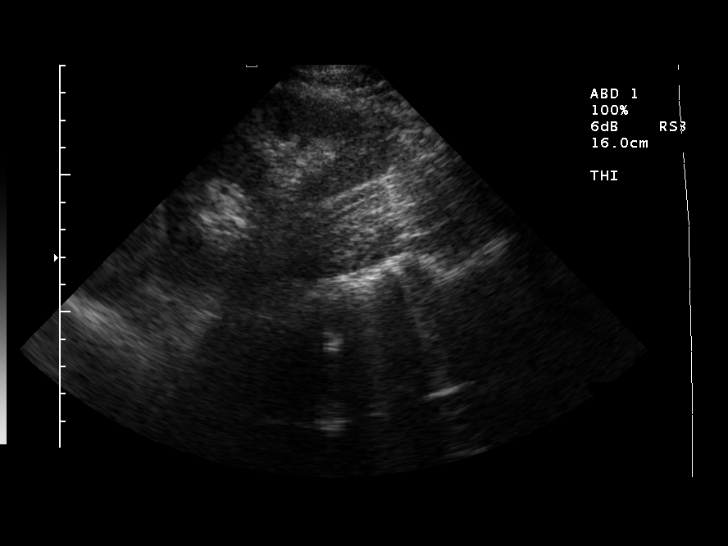
[im 12/72]
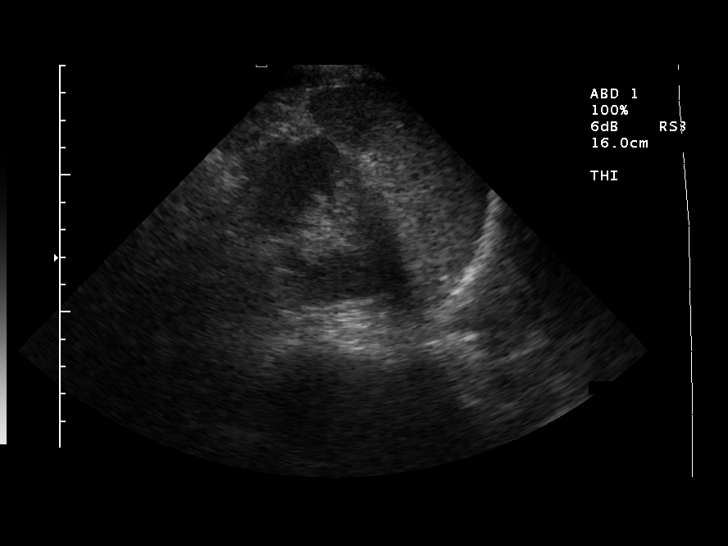
[im 18/72]
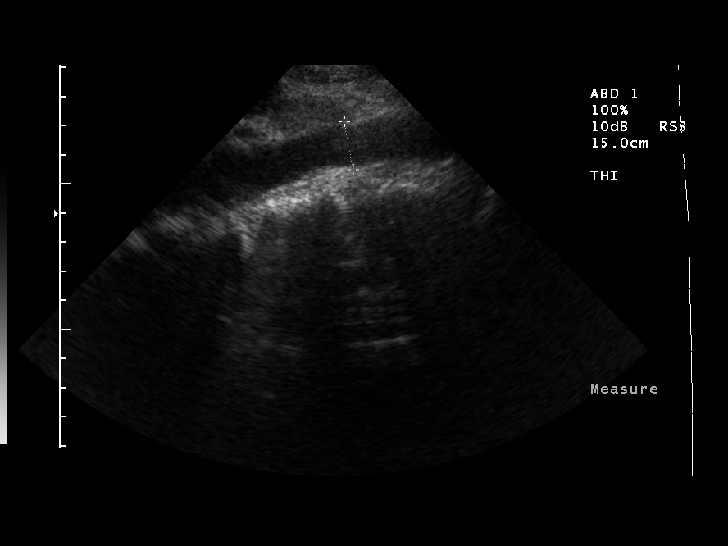
[im 24/72]
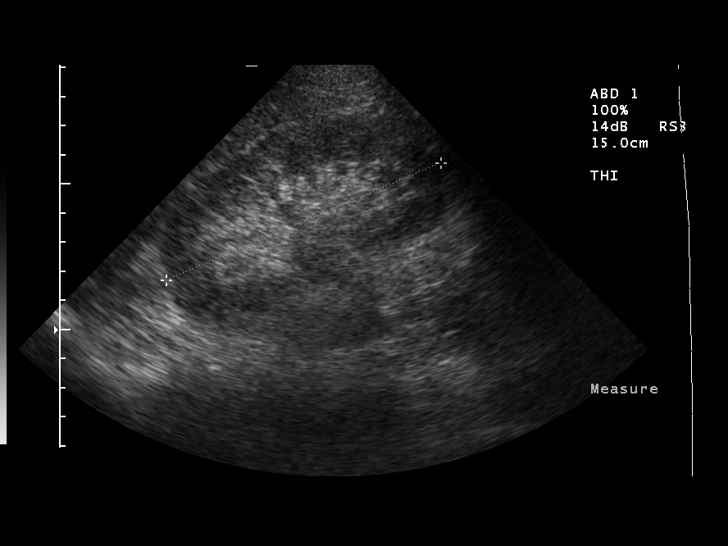
[im 27/72]
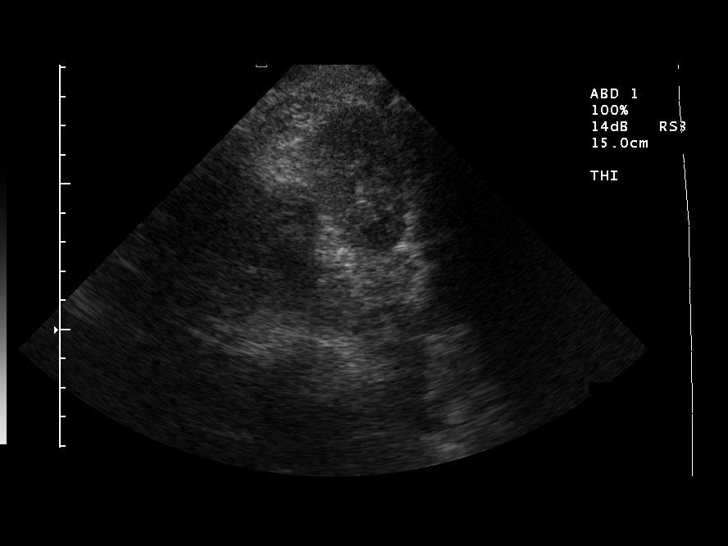
[im 33/72]
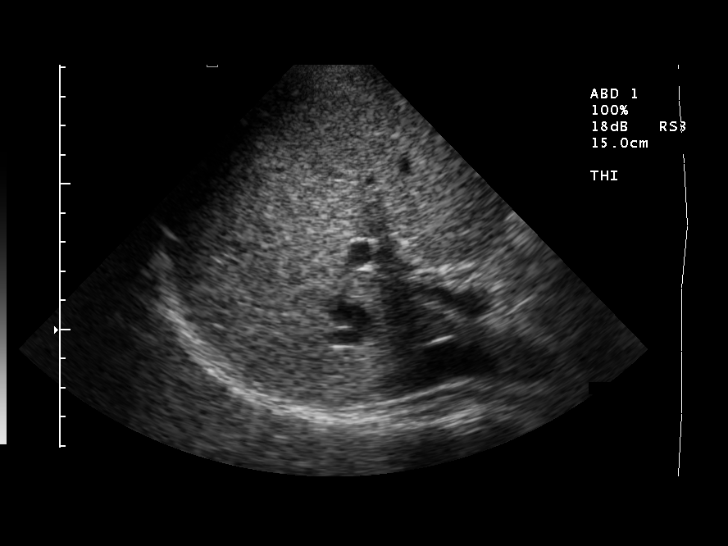
[im 39/72]
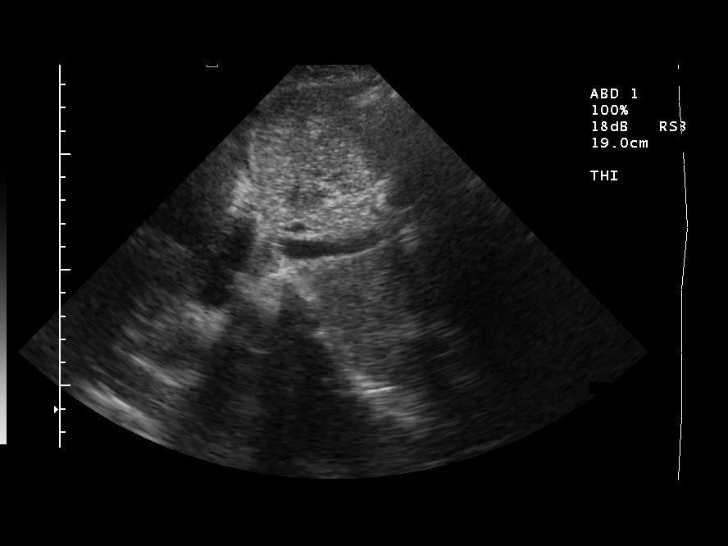
[im 45/72]
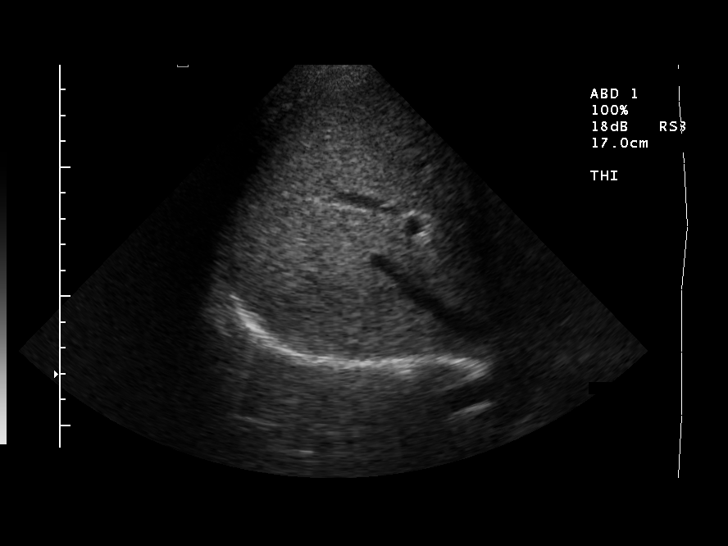
[im 48/72]
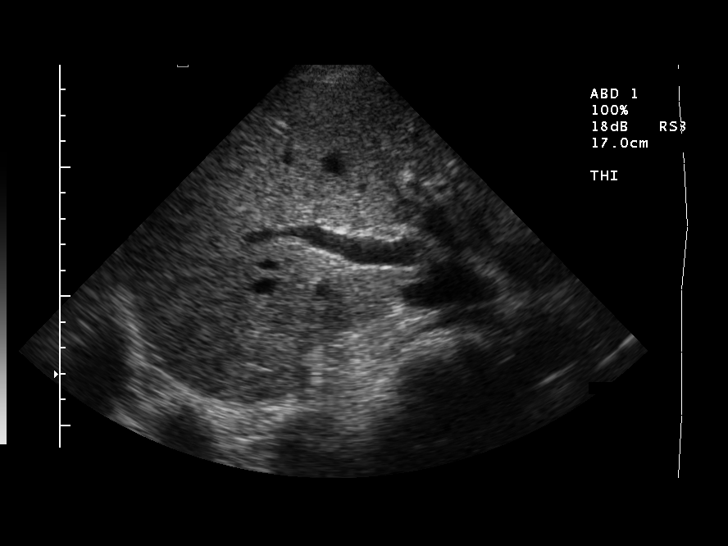
[im 54/72]
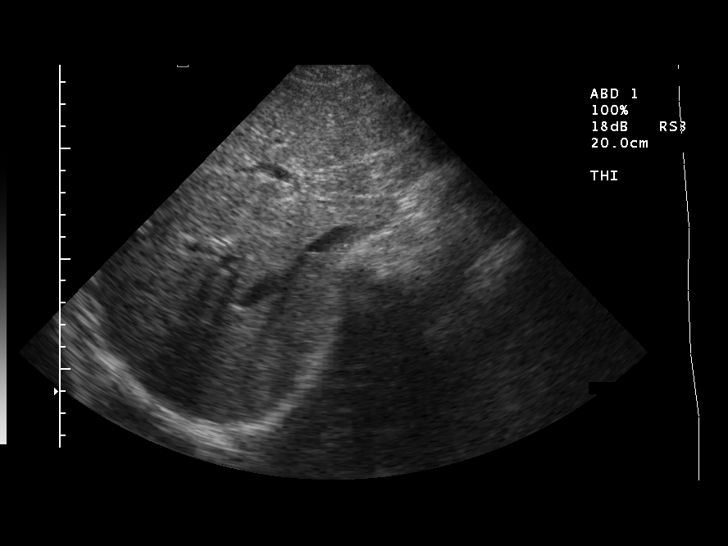
[im 60/72]
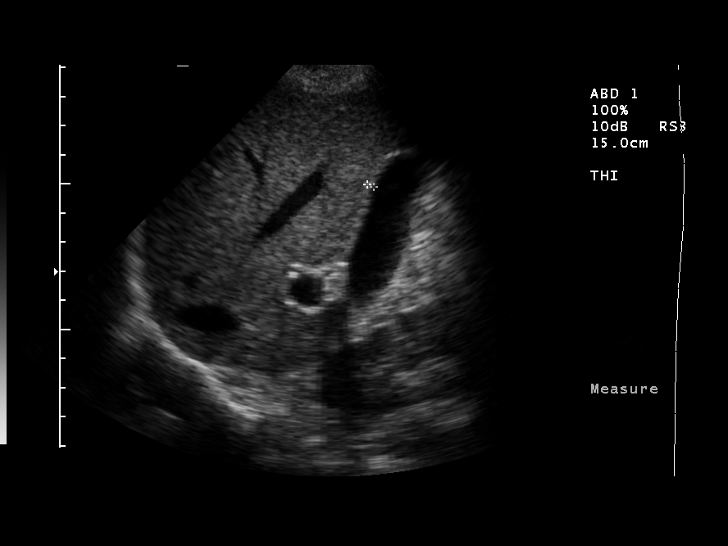
[im 66/72]
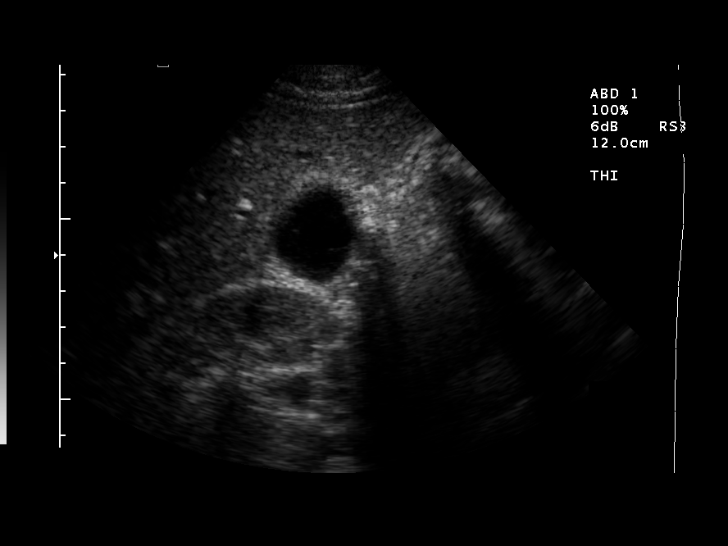
[im 72/72]
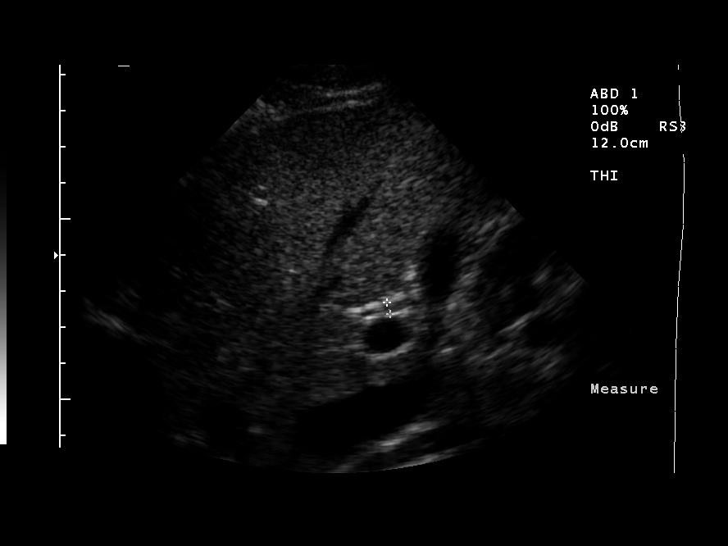

[14 of 25 positions shown; findings below may reference images not displayed]

Ultrasound abdomen:

Comparison [DATE].  Complete abdominal ultrasound examination was performed
including evaluation of the liver, gallbladder, bile ducts, pancreas, kidneys,
spleen, IVC, and abdominal aorta.

There is no evidence of gallstones or biliary ductal dilatation.  The liver is
within normal limits in echogenicity, and no focal liver lesions are seen.  The
visualized portions of the IVC and pancreas are unremarkable. Segments of
pancreas were obscured by overlying bowel gas.

There is no evidence of splenomegaly.  The kidneys are unremarkable, and there
is no evidence of hydronephrosis.  The abdominal aorta is non-dilated.
IMPRESSION: 1.  Negative abdominal ultrasound.

## 2006-01-30 ENCOUNTER — Emergency Department (HOSPITAL_COMMUNITY): Admission: EM | Admit: 2006-01-30 | Discharge: 2006-01-30 | Payer: Self-pay | Admitting: Emergency Medicine

## 2006-01-30 IMAGING — CR DG CHEST 1V PORT
1 series · 1 of 1 positions shown · non-contrast
Comparison: none

CLINICAL DATA: Chest pain, short of breath.
 PORTABLE CHEST - 1 VIEW AT [QG] HOURS:
 Artifact overlies the chest.  Heart size is normal.  The vascularity is normal.  The lungs are clear.  No soft tissue or bony abnormality is seen.

[view not recorded]
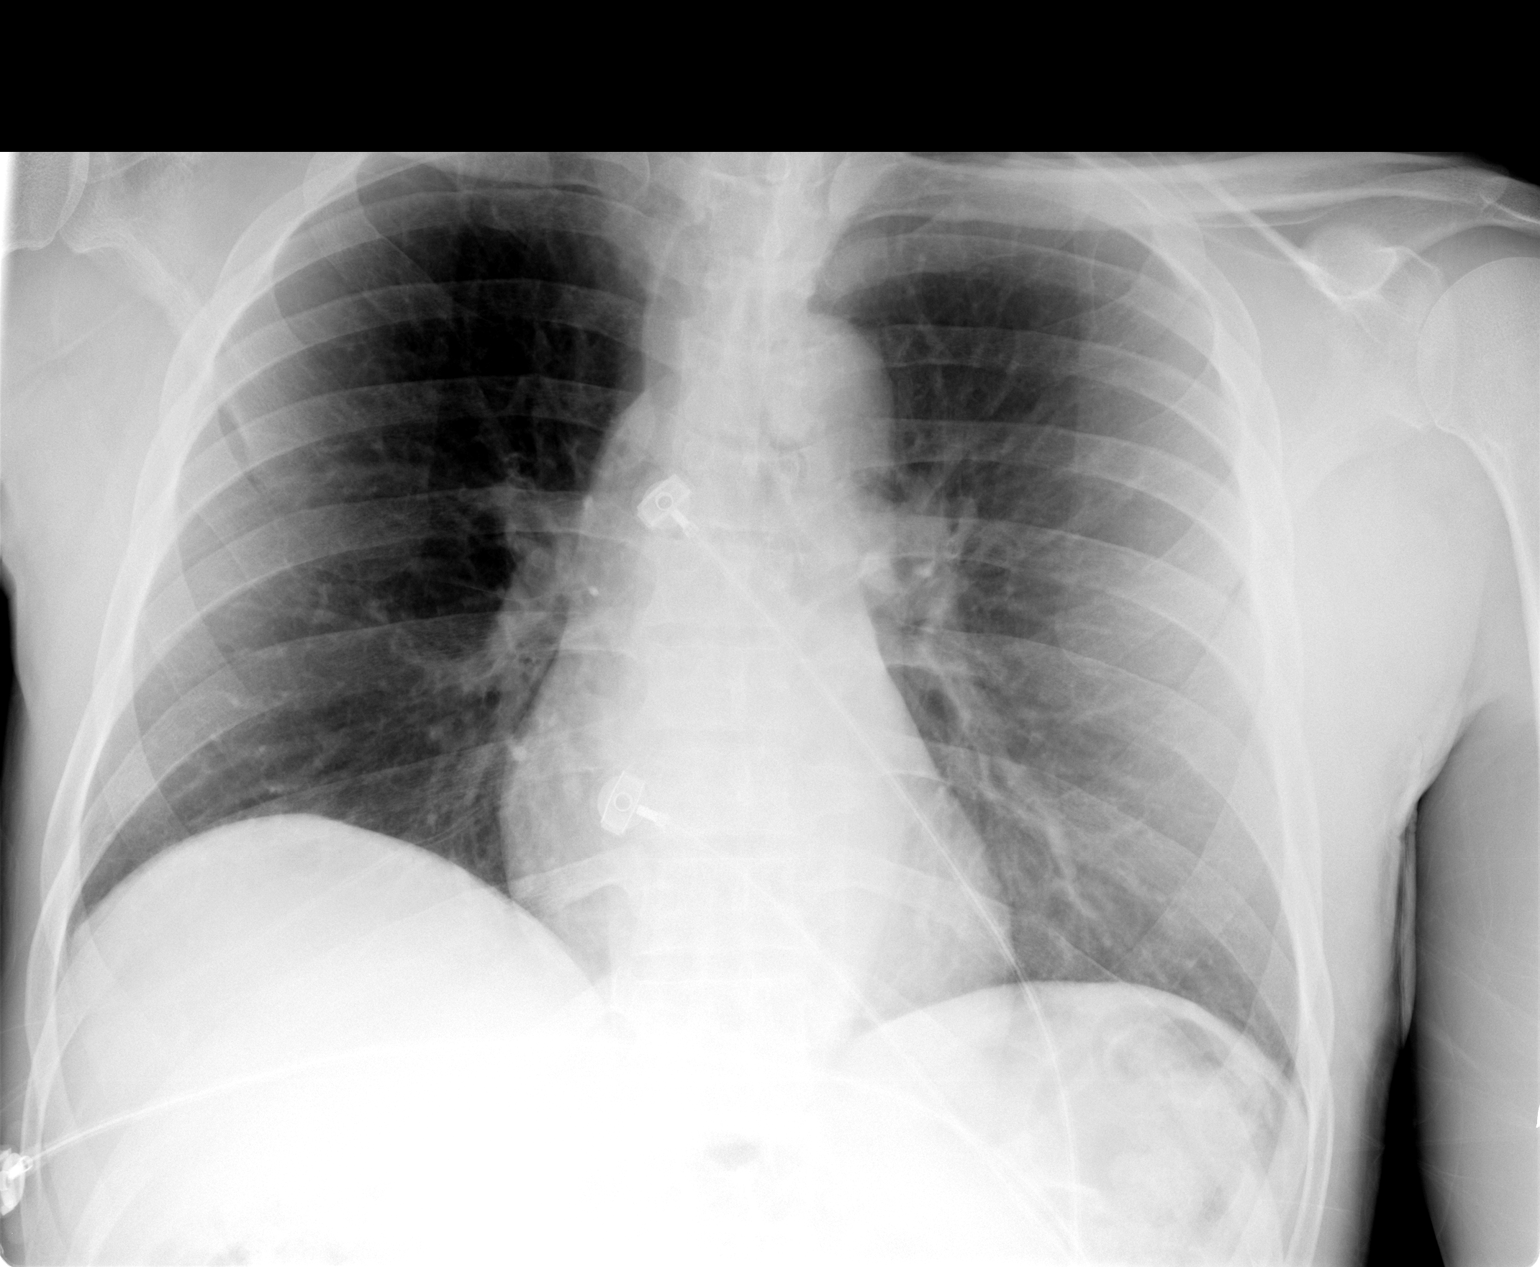

[1 of 1 positions shown; findings below may reference images not displayed]

IMPRESSION: No active disease.

## 2007-01-21 ENCOUNTER — Emergency Department (HOSPITAL_COMMUNITY): Admission: EM | Admit: 2007-01-21 | Discharge: 2007-01-22 | Payer: Self-pay | Admitting: Emergency Medicine

## 2009-02-19 ENCOUNTER — Ambulatory Visit (HOSPITAL_COMMUNITY): Admission: RE | Admit: 2009-02-19 | Discharge: 2009-02-19 | Payer: Self-pay | Admitting: Pulmonary Disease

## 2009-02-19 IMAGING — US US SCROTUM
1 series · 13 of 25 positions shown · non-contrast
Comparison: [DATE]

CLINICAL DATA: Right testicular pain and enlargement

SCROTAL ULTRASOUND
DOPPLER ULTRASOUND OF THE TESTICLES
TECHNIQUE: Complete ultrasound examination of the testicles,
epididymis, and other scrotal structures was performed.  Color and
spectral Doppler ultrasound were also utilized to evaluate blood
flow to the testicles.

[Series 1: unknown · 0.11mm/px · 13 of 59 slices shown]
[im 1/59]
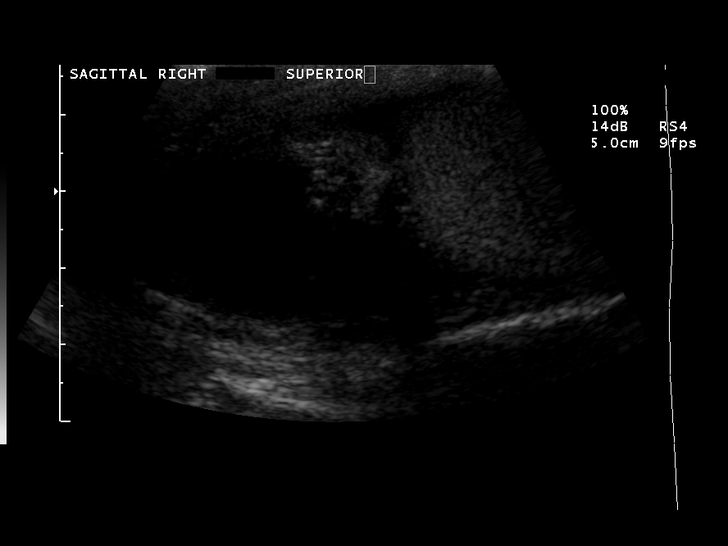
[im 5/59]
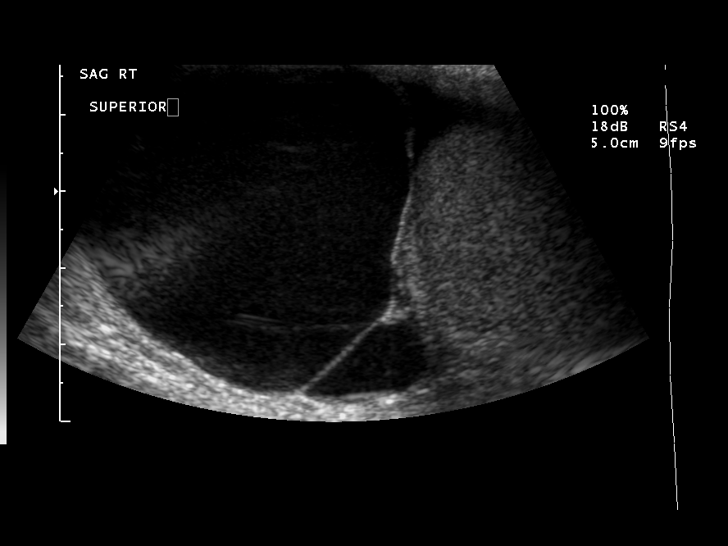
[im 10/59]
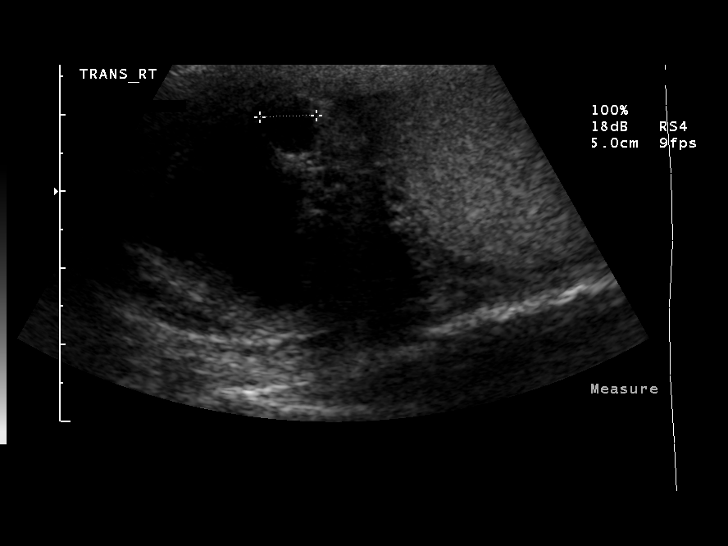
[im 15/59]
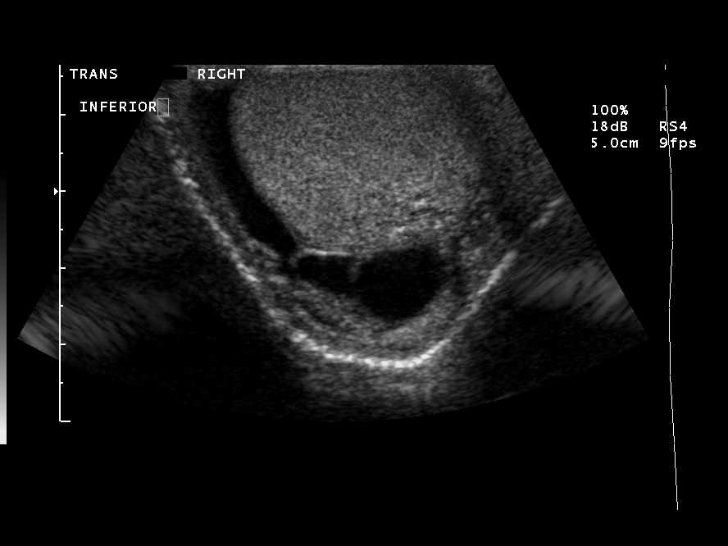
[im 20/59]
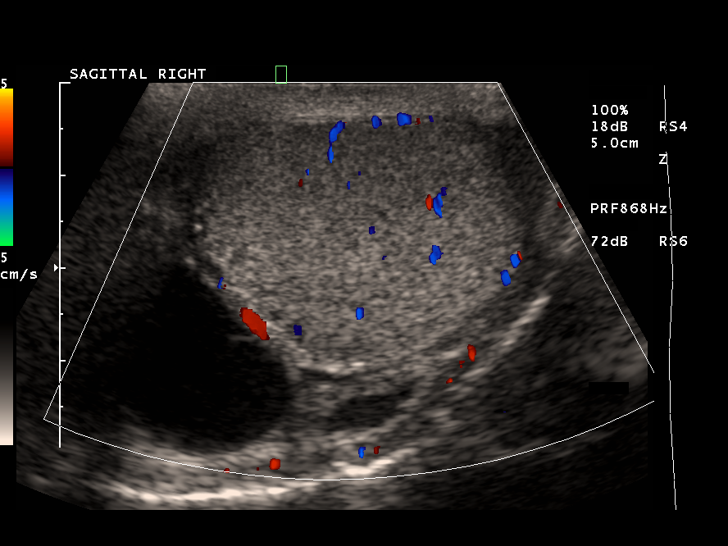
[im 25/59]
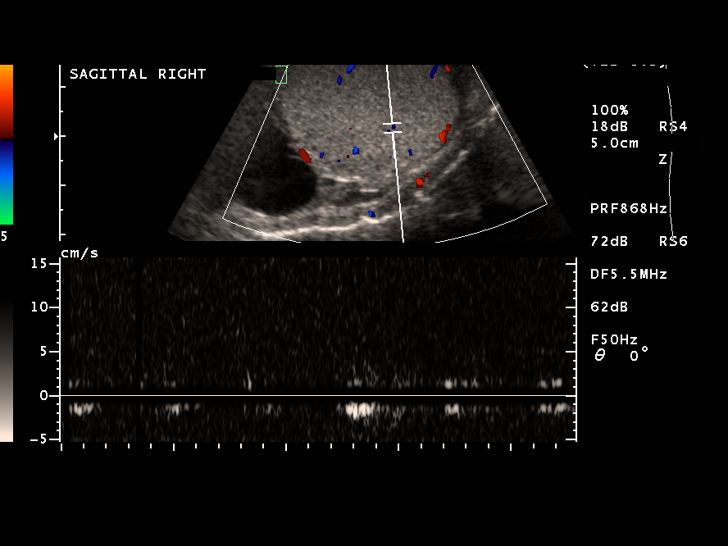
[im 30/59]
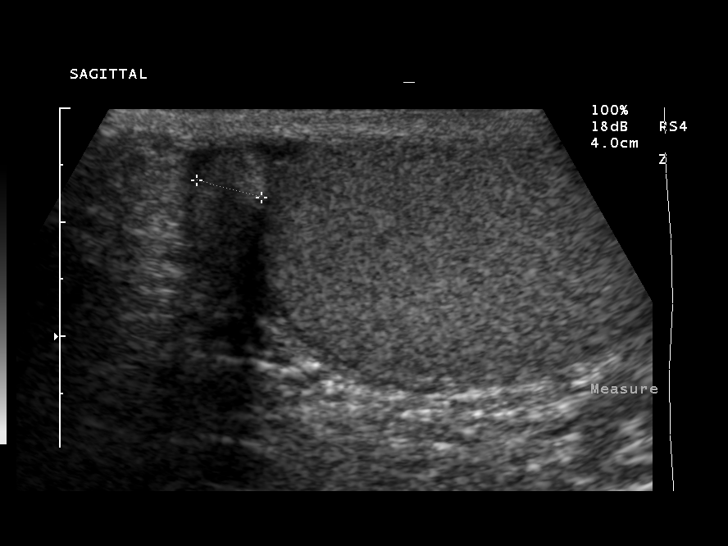
[im 34/59]
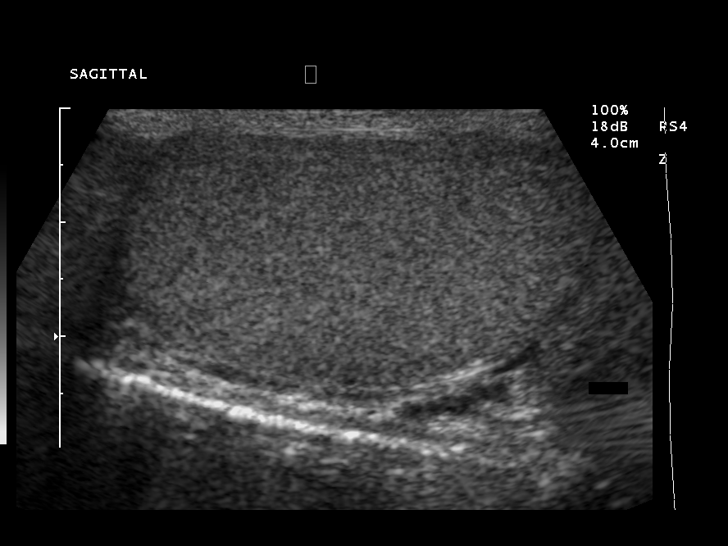
[im 39/59]
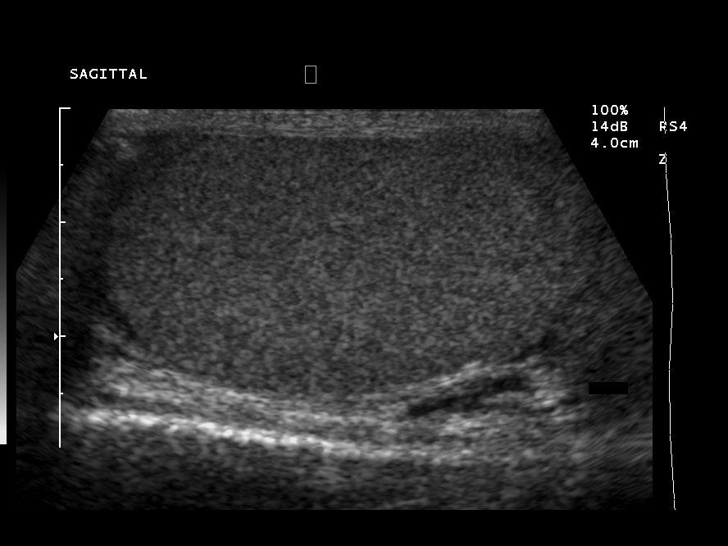
[im 44/59]
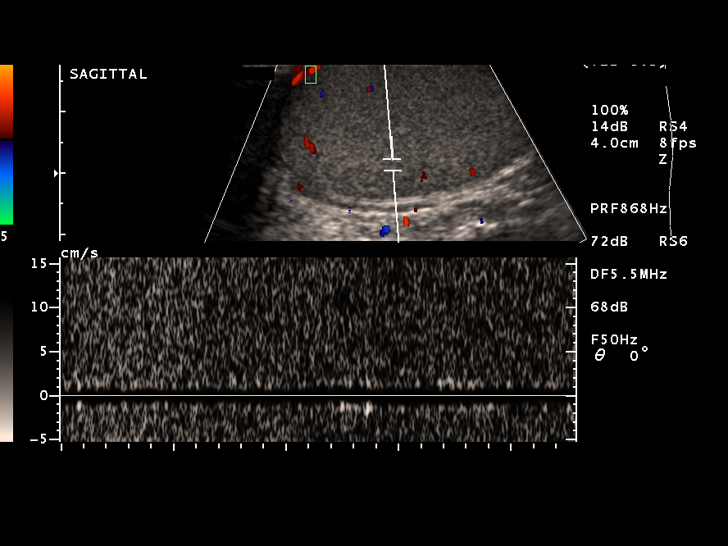
[im 49/59]
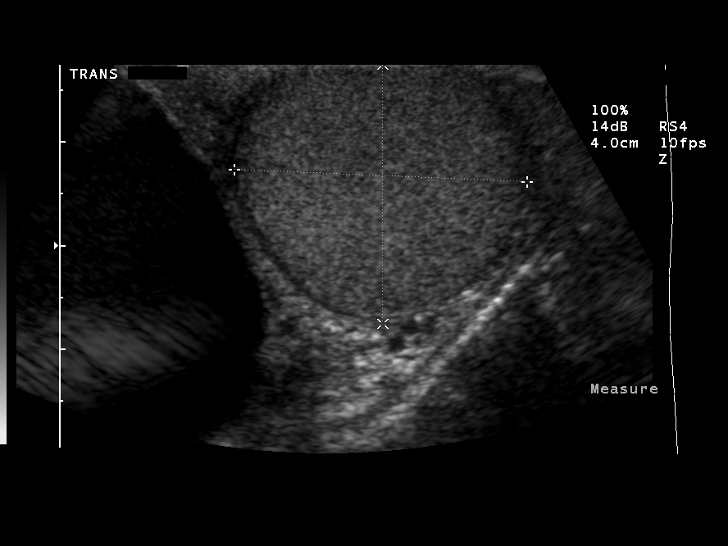
[im 54/59]
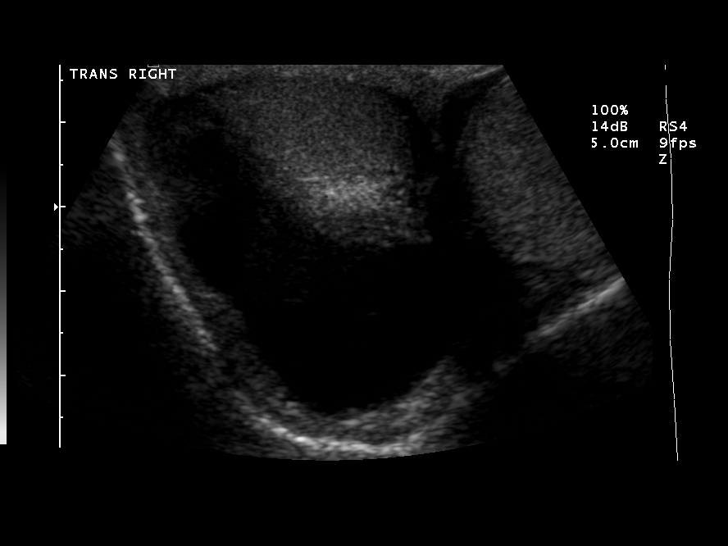
[im 59/59]
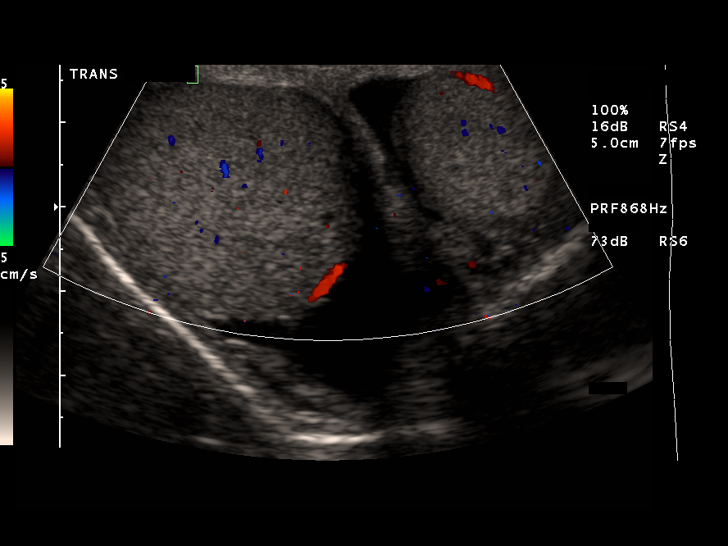

[13 of 25 positions shown; findings below may reference images not displayed]

FINDINGS: Right testis measures 3.4 x 2.8 x 3.2 cm.
Left testis measures 4.8 x 2.5 x 2.8 cm.
Normal symmetric intratesticular echogenicity bilaterally.
No intratesticular mass or calcification identified.
Multiple cysts at head and body of right epididymis, either
epididymal cysts or spermatoceles.
The largest of these measures 4.3 x 3.4 x 3.8 cm and has increased
in size since previous exam.
Tiny bilateral hydroceles.
No left epididymal mass or cyst.
Symmetric intratesticular blood flow identified within both testes
on color Doppler interrogation.
Arterial and venous waveforms present in both testes on spectral
Doppler tracings.
No evidence of hyperemia, testicular torsion, varicocele, or
hernia.
IMPRESSION: Normal appearing testes with tiny bilateral hydroceles.
Cysts in right epididymis, including a large 4.3 x 3.4 x 3.8 cm
diameter minimally complicated cyst at head of right epididymis,
either representing a spermatocele or epididymal cyst, increased in
size since [MX] exam.
No evidence of solid mass, inflammatory process, or testicular
torsion.

## 2011-04-24 NOTE — Op Note (Signed)
Daniel Duffy, Daniel Duffy             ACCOUNT NO.:  [id number]   MEDICAL RECORD NO.:  [id number]          PATIENT TYPE:  AMB   LOCATION:  DAY                           FACILITY:  [facility]   PHYSICIAN:  R. Roetta Sessions, M.D. DATE OF BIRTH:  01-Oct-1951   DATE OF PROCEDURE:  12/29/2005  DATE OF DISCHARGE:                                 OPERATIVE REPORT   PROCEDURE:  Colonoscopy, snare polypectomy, followed by a diagnostic EGD.   INDICATIONS FOR PROCEDURE:  The patient is a 60 year old gentleman referred  courtesy of Dr Juanetta Gosling for colonoscopy.   He is really devoid of any lower GI tract symptoms.  He does have  intermittent epigastric pain and nausea and vomiting.  He was evaluated with  abdominal and pelvic CT scan back in November 2006.  He was found to have a  thickening in the inferior wall and rectum and enlargement of the prostate.  He has not had any rectal bleeding.  No odynophagia, no dysphagia.  He does  have reflux symptoms.  He was started on AcipHex, with some significant  improvement in the above-mentioned upper GI tract symptoms, but not complete  resolution.  He formerly consumed alcohol.  He does not use tobacco.  He is  now undergoing diagnostic colonoscopy.  After some discussion, we decided to  go ahead and do an EGD at same.  Potential risks, benefits, and alternatives  have been reviewed.  Questions answered.  He is agreeable.  Please see  documentation in the medical record for more information.   PROCEDURE NOTE:  O2 saturation, blood pressure, pulse oximetry were  respectively monitored throughout the entire procedure.   CONSCIOUS SEDATION:  Versed 4 mg IV, Demerol 100 mg IV in divided doses.   INSTRUMENT:  Olympus video system.   FINDINGS:  Digital rectal examination revealed no abnormalities.  Endoscopic  findings:  Prep was good.  Rectum and colon:  Examination of the rectal  mucosa including retroflexion at the anal verge revealed some anal papilla  and  internal hemorrhoids.  The vascular pattern was somewhat prominent, but  there were no mucosal abnormalities seen otherwise, aside from a 4 mm polyp  in at 10 cm.   Colonic mucosa was surveyed from the rectosigmoid junction, through the  left, transverse, and right colon, to the appendiceal orifice, ileocecal  valve, and cecum.  These structures were well seen and photographed for the  record.  From this level, the scope was slowly and cautiously withdrawn.  All previously mentioned mucosal surfaces were again seen.  The patient had  very few scattered pancolonic diverticula.  The remainder of the colonic  mucosa appeared normal, and the polyp in the rectum was cold snared and  recovered.  The patient was then prepared for EGD.  After Cetacaine spray  was used to anesthetize the oropharyngeal mucosa, procedure was performed.  Examination of the tubular esophagus revealed noncritical Schatzki's ring.  Otherwise, esophageal mucosa appeared normal.  EG junction was easily  traversed.  Stomach:  Gastric cavity was empty and insufflated well with  air.  Thorough examination of the gastric mucosa  including retroflexion in  the proximal stomach and esophagogastric junction demonstrated a U shaped  configuration of the stomach.  There was a small hiatal hernia.  Otherwise,  the mucosal surfaces of the stomach appeared normal.  Pylorus patent and  easily traversed.  Examination of bulb and second portion revealed no  abnormalities.   THERAPEUTIC/DIAGNOSTIC MANEUVERS PERFORMED:  None.   The patient tolerated both procedures well and was reactivated.   ENDOSCOPIC IMPRESSION:  1.  Internal hemorrhoids, anal papilla.  2.  Diminutive polyp on a stalk at 10 cm, removed with a snare, as described      above, otherwise normal rectum and colon.  3.  Few scattered pancolonic diverticula.  The remainder of the colonic      mucosa appeared normal.  4.  EGD findings:  Noncritical Schatzki's ring,  otherwise normal esophagus,      small hiatal hernia, U shaped stomach, otherwise normal gastric      mucosa.  Patent pylorus.  Normal D1, D2.   RECOMMENDATIONS:  1.  Will check a gallbladder ultrasound to complete the GI workup.  He is to      continue AcipHex 20 mg orally daily.  2.  Follow up on pathology.  3.  Diverticulosis literature given to Mr. Roxan Hockey.  4.  Further recommendations to follow.      Jonathon Bellows, M.D.  Electronically Signed     RMR/MEDQ  D:  12/29/2005  T:  12/29/2005  Job:  [id number]   cc:   Ramon Dredge L. Juanetta Gosling, M.D.  Fax: [phone]   R. Roetta Sessions, M.D.  [address]  [place]  [place] [address]

## 2013-03-02 ENCOUNTER — Encounter (HOSPITAL_COMMUNITY): Payer: Self-pay

## 2013-03-02 ENCOUNTER — Emergency Department (HOSPITAL_COMMUNITY)
Admission: EM | Admit: 2013-03-02 | Discharge: 2013-03-02 | Disposition: A | Discharge status: Home / Self Care | Payer: Medicare Other | Attending: Emergency Medicine | Admitting: Emergency Medicine

## 2013-03-02 DIAGNOSIS — K219 Gastro-esophageal reflux disease without esophagitis: Secondary | ICD-10-CM | POA: Insufficient documentation

## 2013-03-02 DIAGNOSIS — Z79899 Other long term (current) drug therapy: Secondary | ICD-10-CM | POA: Insufficient documentation

## 2013-03-02 DIAGNOSIS — J069 Acute upper respiratory infection, unspecified: Secondary | ICD-10-CM | POA: Insufficient documentation

## 2013-03-02 DIAGNOSIS — R0981 Nasal congestion: Secondary | ICD-10-CM

## 2013-03-02 DIAGNOSIS — R300 Dysuria: Secondary | ICD-10-CM | POA: Insufficient documentation

## 2013-03-02 DIAGNOSIS — R040 Epistaxis: Secondary | ICD-10-CM

## 2013-03-02 DIAGNOSIS — N400 Enlarged prostate without lower urinary tract symptoms: Secondary | ICD-10-CM | POA: Insufficient documentation

## 2013-03-02 DIAGNOSIS — J3489 Other specified disorders of nose and nasal sinuses: Secondary | ICD-10-CM | POA: Insufficient documentation

## 2013-03-02 HISTORY — DX: Gastro-esophageal reflux disease without esophagitis: K21.9

## 2013-03-02 HISTORY — DX: Benign prostatic hyperplasia without lower urinary tract symptoms: N40.0

## 2013-03-02 MED ORDER — PREDNISONE 50 MG PO TABS
60.0000 mg | ORAL_TABLET | Freq: Once | ORAL | Status: AC
  Administered 2013-03-02: 60 mg via ORAL
  Filled 2013-03-02: qty 1

## 2013-03-02 MED ORDER — OXYMETAZOLINE HCL 0.05 % NA SOLN
2.0000 | Freq: Two times a day (BID) | NASAL | Status: DC | PRN
  Administered 2013-03-02: 2 via NASAL
  Filled 2013-03-02: qty 15

## 2013-03-02 MED ORDER — PREDNISONE 10 MG PO TABS: ORAL_TABLET | ORAL | Status: DC

## 2013-03-02 NOTE — ED Provider Notes (Signed)
Medical screening examination/treatment/procedure(s) were performed by non-physician practitioner and as supervising physician I was immediately available for consultation/collaboration.  Flint Melter, MD 03/02/13 [phone]

## 2013-03-02 NOTE — ED Provider Notes (Signed)
History     CSN: [id number]  Arrival date & time 03/02/13  4098   First MD Initiated Contact with Patient 03/02/13 [phone]      Chief Complaint  Patient presents with  . Nasal Congestion    (Consider location/radiation/quality/duration/timing/severity/associated sxs/prior treatment) Patient is a 62 y.o. male presenting with nosebleeds. The history is provided by the patient.  Epistaxis  The current episode started yesterday. The problem occurs daily. The problem has been gradually improving. Associated with: URI. The bleeding has been from the right nare. He has tried nothing for the symptoms. His past medical history is significant for colds and sinus problems. His past medical history does not include bleeding disorder.    Past Medical History  Diagnosis Date  . Acid reflux   . Enlarged prostate     Past Surgical History  Procedure Laterality Date  . Hand surgery    . Spinal cord decompression      No family history on file.  History  Substance Use Topics  . Smoking status: Never Smoker   . Smokeless tobacco: Not on file  . Alcohol Use: No      Review of Systems  Constitutional: Negative for chills and activity change.       All ROS Neg except as noted in HPI  HENT: Positive for nosebleeds, congestion and sinus pressure. Negative for neck pain.   Eyes: Negative for photophobia and discharge.  Respiratory: Negative for cough, shortness of breath and wheezing.   Cardiovascular: Negative for chest pain and palpitations.  Gastrointestinal: Negative for abdominal pain and blood in stool.  Genitourinary: Positive for difficulty urinating. Negative for dysuria, frequency and hematuria.  Musculoskeletal: Negative for back pain and arthralgias.  Skin: Negative.   Neurological: Negative for dizziness, seizures and speech difficulty.  Psychiatric/Behavioral: Negative for hallucinations and confusion.    Allergies  Review of patient's allergies indicates no known  allergies.  Home Medications   Current Outpatient Rx  Name  Route  Sig  Dispense  Refill  . Chlorphen-Phenyleph-ASA (ALKA-SELTZER PLUS COLD PO)   Oral   Take 2 tablets by mouth 2 (two) times daily as needed (cold symptoms).         . Multiple Vitamins-Minerals (MULTIVITAMINS THER. W/MINERALS) TABS   Oral   Take 1 tablet by mouth daily.         Marland Kitchen omeprazole (PRILOSEC) 20 MG capsule   Oral   Take 20 mg by mouth daily.         . tamsulosin (FLOMAX) 0.4 MG CAPS   Oral   Take 0.4 mg by mouth daily.           BP 147/86  Pulse 77  Temp(Src) 98.3 F (36.8 C) (Oral)  Resp 20  Ht 5' 11.5" (1.816 m)  Wt 178 lb (80.74 kg)  BMI 24.48 kg/m2  SpO2 100%  Physical Exam  Nursing note and vitals reviewed. Constitutional: He is oriented to person, place, and time. He appears well-developed and well-nourished.  Non-toxic appearance.  HENT:  Head: Normocephalic.  Right Ear: Tympanic membrane and external ear normal.  Left Ear: Tympanic membrane and external ear normal.  No facial swelling. Pain to percussion of the right frontal sinus. Dried blood in the right nare. No active bleeding in the right or left nostril.  Eyes: EOM and lids are normal. Pupils are equal, round, and reactive to light.  Neck: Normal range of motion. Neck supple. Carotid bruit is not present.  Cardiovascular: Normal rate, regular  rhythm, normal heart sounds, intact distal pulses and normal pulses.   Pulmonary/Chest: Breath sounds normal. No respiratory distress.  Abdominal: Soft. Bowel sounds are normal. There is no tenderness. There is no guarding.  Musculoskeletal: Normal range of motion.  Lymphadenopathy:       Head (right side): No submandibular adenopathy present.       Head (left side): No submandibular adenopathy present.    He has no cervical adenopathy.  Neurological: He is alert and oriented to person, place, and time. He has normal strength. No cranial nerve deficit or sensory deficit.  Skin:  Skin is warm and dry.  Psychiatric: He has a normal mood and affect. His speech is normal.    ED Course  Procedures (including critical care time)  Labs Reviewed - No data to display No results found.   No diagnosis found.    MDM  I have reviewed nursing notes, vital signs, and all appropriate lab and imaging results for this patient. Pt hs nasal congestion, sinus pressure, and occasional nose bleed. Plan will try nasal spray and short course of steroid. Pt to return if any changes or problem.       Kathie Dike, PA-C 03/02/13 1103

## 2013-03-02 NOTE — ED Notes (Signed)
Pt reports sinus congestion for 1 week, nosebleed yesterday and this am.  Has since stopped. Denies any fever or cough

## 2013-03-09 ENCOUNTER — Telehealth: Payer: Self-pay

## 2013-03-09 NOTE — Telephone Encounter (Signed)
Referral from Dr. Juanetta Gosling for colonoscopy. Pt had TCS/EGD with [facility] on 12/29/2005. Path showed a hyperplastic polyp. ( not due until 12/2015 unless having some problems). LMOM for a return call.

## 2013-03-14 NOTE — Telephone Encounter (Signed)
I called pt. He is having problems with constipation. Ov with Gerrit Halls, NP on 03/23/2013 at 2:30 Pm .

## 2013-03-21 ENCOUNTER — Encounter: Payer: Self-pay | Admitting: Internal Medicine

## 2013-03-23 ENCOUNTER — Ambulatory Visit (INDEPENDENT_AMBULATORY_CARE_PROVIDER_SITE_OTHER): Payer: Medicare Other | Admitting: Gastroenterology

## 2013-03-23 ENCOUNTER — Encounter: Payer: Self-pay | Admitting: Gastroenterology

## 2013-03-23 ENCOUNTER — Other Ambulatory Visit: Payer: Self-pay | Admitting: Internal Medicine

## 2013-03-23 VITALS — BP 129/83 | HR 87 | Temp 97.8°F | Ht 71.0 in | Wt 177.8 lb

## 2013-03-23 DIAGNOSIS — K5900 Constipation, unspecified: Secondary | ICD-10-CM

## 2013-03-23 DIAGNOSIS — Z800 Family history of malignant neoplasm of digestive organs: Secondary | ICD-10-CM

## 2013-03-23 DIAGNOSIS — K219 Gastro-esophageal reflux disease without esophagitis: Secondary | ICD-10-CM

## 2013-03-23 MED ORDER — OMEPRAZOLE 20 MG PO CPDR: 20.0000 mg | DELAYED_RELEASE_CAPSULE | Freq: Every day | ORAL | Status: DC

## 2013-03-23 MED ORDER — PEG 3350-KCL-NA BICARB-NACL 420 G PO SOLR: 4000.0000 mL | ORAL | Status: DC

## 2013-03-23 MED ORDER — LINACLOTIDE 290 MCG PO CAPS: 1.0000 | ORAL_CAPSULE | Freq: Every day | ORAL | Status: DC

## 2013-03-23 NOTE — Assessment & Plan Note (Signed)
63 year old male with chronic constipation, failing OTC laxative use. Scant hematochezia noted in the setting of straining. Last colonoscopy in 2007 with hyperplastic polyp. In the interim from our last contact with him, he now states his brother was diagnosed with colon cancer in his 13s. Needs to have more aggressive bowel regimen and proceed with lower GI evaluation in very near future due to new knowledge of family history.   Proceed with TCS with Dr. Jena Gauss in near future: the risks, benefits, and alternatives have been discussed with the patient in detail. The patient states understanding and desires to proceed. Linzess 290 mcg daily

## 2013-03-23 NOTE — Patient Instructions (Addendum)
For constipation: Start taking Linzess 1 capsule each morning, 30 minutes before breakfast.   For reflux: Take Prilosec each day, 30 minutes before breakfast.  Due to your family history, we will proceed with an updated colonoscopy with Dr. Jena Gauss in the near future.

## 2013-03-23 NOTE — Assessment & Plan Note (Signed)
Proceed with TCS as planned.

## 2013-03-23 NOTE — Progress Notes (Signed)
 Primary Care Physician:  HAWKINS,EDWARD L, MD Primary Gastroenterologist:  Dr. Rourk   Chief Complaint  Patient presents with  . Colonoscopy  . Gastrophageal Reflux    HPI:   Daniel Duffy is a 61-year-old male with a history of GERD, presenting today for possible updated colonoscopy. His last colonoscopy was in 2007 with hyperplastic polyp noted. Last EGD in 2007 with non-critical Schatzki's ring, normal stomach.   Notes chronic history of constipation. Takes an OTC laxative. Taking daily with BM every 3-4 days. Feels like constipation is getting worse. Lower abdominal discomfort noted, improved after BM. Only scant hematochezia if straining. No weight loss or lack of appetite. Actually gaining weight. He tells me his brother was diagnosed with colon cancer in his 60s. This is a new finding since our last contact with him in 2007.   Omeprazole for GERD. Ran out first part of the week, reflux flared up. No dysphagia.  Past Medical History  Diagnosis Date  . Acid reflux   . Enlarged prostate     Past Surgical History  Procedure Laterality Date  . Hand surgery      bilateral, got caught in a machine, two different occasions, right hand originally, than left hand. Left hand with skin graft  . Spinal cord decompression    . Colonoscopy  12/29/2005    RMR:Internal hemorrhoids, anal papilla/Diminutive polyp on a stalk at 10 cm/otherwise normal rectum and colon, PATH: hyperplastic polyp  . Esophagogastroduodenoscopy  12/29/2005    RMR:noncritical Schatzki's ring, small hiatal hernia, normal gastric mucosa, normal D1 and D2.     Current Outpatient Prescriptions  Medication Sig Dispense Refill  . Multiple Vitamins-Minerals (MULTIVITAMINS THER. W/MINERALS) TABS Take 1 tablet by mouth daily.      . omeprazole (PRILOSEC) 20 MG capsule Take 1 capsule (20 mg total) by mouth daily.  30 capsule  11  . tamsulosin (FLOMAX) 0.4 MG CAPS Take 0.4 mg by mouth daily.      . Linaclotide (LINZESS)  290 MCG CAPS Take 1 capsule by mouth daily. 30 minutes before breakfast.  30 capsule  3  . polyethylene glycol-electrolytes (TRILYTE) 420 G solution Take 4,000 mLs by mouth as directed.  4000 mL  0   No current facility-administered medications for this visit.    Allergies as of 03/23/2013  . (No Known Allergies)    Family History  Problem Relation Age of Onset  . Colon cancer Brother     60s    History   Social History  . Marital Status: Divorced    Spouse Name: N/A    Number of Children: N/A  . Years of Education: N/A   Occupational History  . Not on file.   Social History Main Topics  . Smoking status: Never Smoker   . Smokeless tobacco: Not on file  . Alcohol Use: No  . Drug Use: No  . Sexually Active: No   Other Topics Concern  . Not on file   Social History Narrative  . No narrative on file    Review of Systems: Gen: Denies any fever, chills, fatigue, weight loss, lack of appetite.  CV: +palpitations Resp: +DOE GI: SEE HPI GU : urinary hesitancy MS: +joint pain  Derm: Denies rash, itching, dry skin Psych: Denies depression, anxiety, memory loss, and confusion Heme: Denies bruising, bleeding, and enlarged lymph nodes.  Physical Exam: BP 129/83  Pulse 87  Temp(Src) 97.8 F (36.6 C) (Oral)  Ht 5' 11" (1.803 m)  Wt 177 lb   12.8 oz (80.65 kg)  BMI 24.81 kg/m2 General:   Alert and oriented. Pleasant and cooperative. Well-nourished and well-developed.  Head:  Normocephalic and atraumatic. Eyes:  Without icterus, sclera clear and conjunctiva pink.  Ears:  Normal auditory acuity. Nose:  No deformity, discharge,  or lesions. Mouth:  No deformity or lesions, oral mucosa pink.  Neck:  Supple, without mass or thyromegaly. Lungs:  Clear to auscultation bilaterally. No wheezes, rales, or rhonchi. No distress.  Heart:  S1, S2 present without murmurs appreciated.  Abdomen:  +BS, soft, non-tender and non-distended. No HSM noted. No guarding or rebound.  Moderate-sized ventral hernia, reducible.  Rectal:  Deferred  Msk:  Symmetrical without gross deformities. Normal posture. Pulses:  Normal pulses noted. Extremities:  Left hand with skin graft, right hand with pointer finger removed, secondary to machine injury Neurologic:  Alert and  oriented x4;  grossly normal neurologically. Skin:  Intact without significant lesions or rashes. Cervical Nodes:  No significant cervical adenopathy. Psych:  Alert and cooperative. Normal mood and affect.    

## 2013-03-27 ENCOUNTER — Encounter: Payer: Self-pay | Admitting: Gastroenterology

## 2013-03-27 NOTE — Progress Notes (Signed)
Cc PCP 

## 2013-03-30 ENCOUNTER — Encounter (HOSPITAL_COMMUNITY): Payer: Self-pay | Admitting: Pharmacy Technician

## 2013-04-10 ENCOUNTER — Encounter (HOSPITAL_COMMUNITY)
Admission: RE | Disposition: A | Discharge status: Home / Self Care | Payer: Self-pay | Source: Ambulatory Visit | Attending: Internal Medicine

## 2013-04-10 ENCOUNTER — Ambulatory Visit (HOSPITAL_COMMUNITY)
Admission: RE | Admit: 2013-04-10 | Discharge: 2013-04-10 | Disposition: A | Discharge status: Home / Self Care | Payer: Medicare Other | Source: Ambulatory Visit | Attending: Internal Medicine | Admitting: Internal Medicine

## 2013-04-10 ENCOUNTER — Encounter (HOSPITAL_COMMUNITY): Payer: Self-pay | Admitting: *Deleted

## 2013-04-10 DIAGNOSIS — Z83719 Family history of colon polyps, unspecified: Secondary | ICD-10-CM | POA: Insufficient documentation

## 2013-04-10 DIAGNOSIS — D126 Benign neoplasm of colon, unspecified: Secondary | ICD-10-CM | POA: Insufficient documentation

## 2013-04-10 DIAGNOSIS — Z8371 Family history of colonic polyps: Secondary | ICD-10-CM

## 2013-04-10 DIAGNOSIS — K648 Other hemorrhoids: Secondary | ICD-10-CM | POA: Insufficient documentation

## 2013-04-10 DIAGNOSIS — Z800 Family history of malignant neoplasm of digestive organs: Secondary | ICD-10-CM | POA: Insufficient documentation

## 2013-04-10 DIAGNOSIS — Z79899 Other long term (current) drug therapy: Secondary | ICD-10-CM | POA: Insufficient documentation

## 2013-04-10 DIAGNOSIS — K5900 Constipation, unspecified: Secondary | ICD-10-CM | POA: Insufficient documentation

## 2013-04-10 DIAGNOSIS — K219 Gastro-esophageal reflux disease without esophagitis: Secondary | ICD-10-CM | POA: Insufficient documentation

## 2013-04-10 DIAGNOSIS — Z1211 Encounter for screening for malignant neoplasm of colon: Secondary | ICD-10-CM | POA: Insufficient documentation

## 2013-04-10 DIAGNOSIS — K921 Melena: Secondary | ICD-10-CM

## 2013-04-10 DIAGNOSIS — Q391 Atresia of esophagus with tracheo-esophageal fistula: Secondary | ICD-10-CM | POA: Insufficient documentation

## 2013-04-10 DIAGNOSIS — N400 Enlarged prostate without lower urinary tract symptoms: Secondary | ICD-10-CM | POA: Insufficient documentation

## 2013-04-10 HISTORY — PX: COLONOSCOPY: SHX5424

## 2013-04-10 SURGERY — COLONOSCOPY
Anesthesia: Moderate Sedation

## 2013-04-10 MED ORDER — MIDAZOLAM HCL 5 MG/5ML IJ SOLN
INTRAMUSCULAR | Status: DC | PRN
  Administered 2013-04-10 (×2): 2 mg via INTRAVENOUS

## 2013-04-10 MED ORDER — MEPERIDINE HCL 100 MG/ML IJ SOLN
INTRAMUSCULAR | Status: AC
  Filled 2013-04-10: qty 2

## 2013-04-10 MED ORDER — SODIUM CHLORIDE 0.9 % IV SOLN
INTRAVENOUS | Status: DC
  Administered 2013-04-10: 14:00:00 via INTRAVENOUS

## 2013-04-10 MED ORDER — STERILE WATER FOR IRRIGATION IR SOLN
Status: DC | PRN
  Administered 2013-04-10: 14:00:00

## 2013-04-10 MED ORDER — ONDANSETRON HCL 4 MG/2ML IJ SOLN
INTRAMUSCULAR | Status: DC | PRN
  Administered 2013-04-10: 4 mg via INTRAVENOUS

## 2013-04-10 MED ORDER — MIDAZOLAM HCL 5 MG/5ML IJ SOLN
INTRAMUSCULAR | Status: AC
  Filled 2013-04-10: qty 10

## 2013-04-10 MED ORDER — MEPERIDINE HCL 100 MG/ML IJ SOLN
INTRAMUSCULAR | Status: DC | PRN
  Administered 2013-04-10 (×2): 50 mg via INTRAVENOUS

## 2013-04-10 MED ORDER — ONDANSETRON HCL 4 MG/2ML IJ SOLN
INTRAMUSCULAR | Status: AC
  Filled 2013-04-10: qty 2

## 2013-04-10 NOTE — H&P (View-Only) (Signed)
Primary Care Physician:  Fredirick Maudlin, MD Primary Gastroenterologist:  Dr. Jena Gauss   Chief Complaint  Patient presents with  . Colonoscopy  . Gastrophageal Reflux    HPI:   Daniel Duffy is a 62 year old male with a history of GERD, presenting today for possible updated colonoscopy. His last colonoscopy was in 2007 with hyperplastic polyp noted. Last EGD in 2007 with non-critical Schatzki's ring, normal stomach.   Notes chronic history of constipation. Takes an OTC laxative. Taking daily with BM every 3-4 days. Feels like constipation is getting worse. Lower abdominal discomfort noted, improved after BM. Only scant hematochezia if straining. No weight loss or lack of appetite. Actually gaining weight. He tells me his brother was diagnosed with colon cancer in his 36s. This is a new finding since our last contact with him in 2007.   Omeprazole for GERD. Ran out first part of the week, reflux flared up. No dysphagia.  Past Medical History  Diagnosis Date  . Acid reflux   . Enlarged prostate     Past Surgical History  Procedure Laterality Date  . Hand surgery      bilateral, got caught in a machine, two different occasions, right hand originally, than left hand. Left hand with skin graft  . Spinal cord decompression    . Colonoscopy  12/29/2005    AVW:[id number] hemorrhoids, anal papilla/Diminutive polyp on a stalk at 10 cm/otherwise normal rectum and colon, PATH: hyperplastic polyp  . Esophagogastroduodenoscopy  12/29/2005    NWG:[id number] Schatzki's ring, small hiatal hernia, normal gastric mucosa, normal D1 and D2.     Current Outpatient Prescriptions  Medication Sig Dispense Refill  . Multiple Vitamins-Minerals (MULTIVITAMINS THER. W/MINERALS) TABS Take 1 tablet by mouth daily.      Marland Kitchen omeprazole (PRILOSEC) 20 MG capsule Take 1 capsule (20 mg total) by mouth daily.  30 capsule  11  . tamsulosin (FLOMAX) 0.4 MG CAPS Take 0.4 mg by mouth daily.      . Linaclotide (LINZESS)  290 MCG CAPS Take 1 capsule by mouth daily. 30 minutes before breakfast.  30 capsule  3  . polyethylene glycol-electrolytes (TRILYTE) 420 G solution Take 4,000 mLs by mouth as directed.  4000 mL  0   No current facility-administered medications for this visit.    Allergies as of 03/23/2013  . (No Known Allergies)    Family History  Problem Relation Age of Onset  . Colon cancer Brother     7s    History   Social History  . Marital Status: Divorced    Spouse Name: N/A    Number of Children: N/A  . Years of Education: N/A   Occupational History  . Not on file.   Social History Main Topics  . Smoking status: Never Smoker   . Smokeless tobacco: Not on file  . Alcohol Use: No  . Drug Use: No  . Sexually Active: No   Other Topics Concern  . Not on file   Social History Narrative  . No narrative on file    Review of Systems: Gen: Denies any fever, chills, fatigue, weight loss, lack of appetite.  CV: +palpitations Resp: +DOE GI: SEE HPI GU : urinary hesitancy MS: +joint pain  Derm: Denies rash, itching, dry skin Psych: Denies depression, anxiety, memory loss, and confusion Heme: Denies bruising, bleeding, and enlarged lymph nodes.  Physical Exam: BP 129/83  Pulse 87  Temp(Src) 97.8 F (36.6 C) (Oral)  Ht 5\' 11"  (1.803 m)  Wt 177 lb  12.8 oz (80.65 kg)  BMI 24.81 kg/m2 General:   Alert and oriented. Pleasant and cooperative. Well-nourished and well-developed.  Head:  Normocephalic and atraumatic. Eyes:  Without icterus, sclera clear and conjunctiva pink.  Ears:  Normal auditory acuity. Nose:  No deformity, discharge,  or lesions. Mouth:  No deformity or lesions, oral mucosa pink.  Neck:  Supple, without mass or thyromegaly. Lungs:  Clear to auscultation bilaterally. No wheezes, rales, or rhonchi. No distress.  Heart:  S1, S2 present without murmurs appreciated.  Abdomen:  +BS, soft, non-tender and non-distended. No HSM noted. No guarding or rebound.  Moderate-sized ventral hernia, reducible.  Rectal:  Deferred  Msk:  Symmetrical without gross deformities. Normal posture. Pulses:  Normal pulses noted. Extremities:  Left hand with skin graft, right hand with pointer finger removed, secondary to machine injury Neurologic:  Alert and  oriented x4;  grossly normal neurologically. Skin:  Intact without significant lesions or rashes. Cervical Nodes:  No significant cervical adenopathy. Psych:  Alert and cooperative. Normal mood and affect.

## 2013-04-10 NOTE — Interval H&P Note (Signed)
History and Physical Interval Note:  04/10/2013 2:06 PM  Daniel Duffy  has presented today for surgery, with the diagnosis of CONSTIPATION, GERD AND FAMILY HISTORY OF COLON CANCER  The various methods of treatment have been discussed with the patient and family. After consideration of risks, benefits and other options for treatment, the patient has consented to  Procedure(s) with comments: COLONOSCOPY (N/A) - 2:00 as a surgical intervention .  The patient's history has been reviewed, patient examined, no change in status, stable for surgery.  I have reviewed the patient's chart and labs.  Questions were answered to the patient's satisfaction.      Colonoscopy per plan today. States that Linzess is working very well for bowel function. His brother had precancerous colon polyps but not out and out colon cancer.. The risks, benefits, limitations, alternatives and imponderables have been reviewed with the patient. Questions have been answered. All parties are agreeable.    Daniel Duffy

## 2013-04-10 NOTE — Op Note (Signed)
[facility] [address] [place], [address]   COLONOSCOPY PROCEDURE REPORT  PATIENT: Daniel Duffy, Daniel Duffy  MR#:         [id number] BIRTHDATE: Feb 25, 1951 , 62  yrs. old GENDER: Male ENDOSCOPIST: R.  Roetta Sessions, MD FACP FACG REFERRED BY:  Kari Baars, M.D. PROCEDURE DATE:  04/10/2013 PROCEDURE:     Colonoscopy with biopsy  INDICATIONS: Paper hematochezia;  Constipation much better with Linzess; Brother with the precancerous colon polyps at relatively young age  INFORMED CONSENT:  The risks, benefits, alternatives and imponderables including but not limited to bleeding, perforation as well as the possibility of a missed lesion have been reviewed.  The potential for biopsy, lesion removal, etc. have also been discussed.  Questions have been answered.  All parties agreeable. Please see the history and physical in the medical record for more information.  MEDICATIONS: Versed 4 mg IV and Demerol 100 mg IV in divided doses. Zofran 4 mg IV  DESCRIPTION OF PROCEDURE:  After a digital rectal exam was performed, the EC-3890Li ([id number])  colonoscope was advanced from the anus through the rectum and colon to the area of the cecum, ileocecal valve and appendiceal orifice.  The cecum was deeply intubated.  These structures were well-seen and photographed for the record.  From the level of the cecum and ileocecal valve, the scope was slowly and cautiously withdrawn.  The mucosal surfaces were carefully surveyed utilizing scope tip deflection to facilitate fold flattening as needed.  The scope was pulled down into the rectum where a thorough examination including retroflexion was performed.    FINDINGS:  Adequate preparation. Minimal anal canal hemorrhoids; otherwise, normal rectum. (1) diminutive polyp in the cecum; otherwise, the remainder of the colonic mucosa appeared normal  THERAPEUTIC / DIAGNOSTIC MANEUVERS PERFORMED:  The above-mentioned polyps was cold  biopsied/removed.  COMPLICATIONS: none  CECAL WITHDRAWAL TIME:  8 minutes  IMPRESSION:  Colonic polyp-removed as described above the  RECOMMENDATIONS: Followup on pathology. Continue Linzess daily   _______________________________ eSigned:  R. Roetta Sessions, MD FACP [facility] 04/10/2013 2:38 PM   CC:

## 2013-04-11 ENCOUNTER — Encounter (HOSPITAL_COMMUNITY): Payer: Self-pay | Admitting: Internal Medicine

## 2013-04-12 ENCOUNTER — Encounter: Payer: Self-pay | Admitting: Internal Medicine

## 2014-01-29 ENCOUNTER — Encounter (HOSPITAL_COMMUNITY): Payer: Self-pay | Admitting: Emergency Medicine

## 2014-01-29 ENCOUNTER — Emergency Department (HOSPITAL_COMMUNITY)
Admission: EM | Admit: 2014-01-29 | Discharge: 2014-01-30 | Disposition: A | Discharge status: Home / Self Care | Payer: Medicare Other | Attending: Emergency Medicine | Admitting: Emergency Medicine

## 2014-01-29 DIAGNOSIS — K219 Gastro-esophageal reflux disease without esophagitis: Secondary | ICD-10-CM | POA: Insufficient documentation

## 2014-01-29 DIAGNOSIS — Z79899 Other long term (current) drug therapy: Secondary | ICD-10-CM | POA: Insufficient documentation

## 2014-01-29 DIAGNOSIS — Z87448 Personal history of other diseases of urinary system: Secondary | ICD-10-CM | POA: Insufficient documentation

## 2014-01-29 DIAGNOSIS — R109 Unspecified abdominal pain: Secondary | ICD-10-CM | POA: Insufficient documentation

## 2014-01-29 DIAGNOSIS — R112 Nausea with vomiting, unspecified: Secondary | ICD-10-CM | POA: Insufficient documentation

## 2014-01-29 DIAGNOSIS — K5900 Constipation, unspecified: Secondary | ICD-10-CM | POA: Insufficient documentation

## 2014-01-29 LAB — CBC WITH DIFFERENTIAL/PLATELET
BASOS ABS: 0 10*3/uL (ref 0.0–0.1)
BASOS PCT: 0 % (ref 0–1)
EOS ABS: 0.1 10*3/uL (ref 0.0–0.7)
EOS PCT: 2 % (ref 0–5)
HCT: 45.5 % (ref 39.0–52.0)
Hemoglobin: 15.2 g/dL (ref 13.0–17.0)
Lymphocytes Relative: 9 % — ABNORMAL LOW (ref 12–46)
Lymphs Abs: 0.5 10*3/uL — ABNORMAL LOW (ref 0.7–4.0)
MCH: 27.9 pg (ref 26.0–34.0)
MCHC: 33.4 g/dL (ref 30.0–36.0)
MCV: 83.6 fL (ref 78.0–100.0)
Monocytes Absolute: 0.2 10*3/uL (ref 0.1–1.0)
Monocytes Relative: 3 % (ref 3–12)
Neutro Abs: 4.4 10*3/uL (ref 1.7–7.7)
Neutrophils Relative %: 86 % — ABNORMAL HIGH (ref 43–77)
PLATELETS: 229 10*3/uL (ref 150–400)
RBC: 5.44 MIL/uL (ref 4.22–5.81)
RDW: 14.3 % (ref 11.5–15.5)
WBC: 5.1 10*3/uL (ref 4.0–10.5)

## 2014-01-29 LAB — URINALYSIS, ROUTINE W REFLEX MICROSCOPIC
BILIRUBIN URINE: NEGATIVE
Glucose, UA: NEGATIVE mg/dL
HGB URINE DIPSTICK: NEGATIVE
Ketones, ur: NEGATIVE mg/dL
Leukocytes, UA: NEGATIVE
Nitrite: NEGATIVE
PROTEIN: NEGATIVE mg/dL
Specific Gravity, Urine: 1.02 (ref 1.005–1.030)
UROBILINOGEN UA: 0.2 mg/dL (ref 0.0–1.0)
pH: 7.5 (ref 5.0–8.0)

## 2014-01-29 NOTE — ED Notes (Signed)
Patient complaining of lower abdominal pain and nausea. Reports vomited x 3. Also states he thinks he may be constipated.

## 2014-01-30 ENCOUNTER — Emergency Department (HOSPITAL_COMMUNITY): Payer: Medicare Other

## 2014-01-30 LAB — COMPREHENSIVE METABOLIC PANEL
ALT: 38 U/L (ref 0–53)
AST: 45 U/L — AB (ref 0–37)
Albumin: 3.8 g/dL (ref 3.5–5.2)
Alkaline Phosphatase: 82 U/L (ref 39–117)
BUN: 11 mg/dL (ref 6–23)
CALCIUM: 10.4 mg/dL (ref 8.4–10.5)
CO2: 26 mEq/L (ref 19–32)
Chloride: 101 mEq/L (ref 96–112)
Creatinine, Ser: 1.14 mg/dL (ref 0.50–1.35)
GFR calc Af Amer: 79 mL/min — ABNORMAL LOW (ref >90)
GFR calc non Af Amer: 68 mL/min — ABNORMAL LOW (ref >90)
Glucose, Bld: 126 mg/dL — ABNORMAL HIGH (ref 70–99)
Potassium: 3.7 mEq/L (ref 3.7–5.3)
SODIUM: 138 meq/L (ref 137–147)
TOTAL PROTEIN: 7.9 g/dL (ref 6.0–8.3)
Total Bilirubin: 0.4 mg/dL (ref 0.3–1.2)

## 2014-01-30 LAB — I-STAT CG4 LACTIC ACID, ED: Lactic Acid, Venous: 1.36 mmol/L (ref 0.5–2.2)

## 2014-01-30 IMAGING — CT CT ABD-PELV W/ CM
2 of 4 series · 16 of 46 positions shown, 18 images · IV contrast (Omnipaque 300)
Comparison: [DATE] ultrasound and [DATE] CT

CLINICAL DATA: 62-year-old male with abdominal and pelvic pain and
vomiting.

EXAM:
CT ABDOMEN AND PELVIS WITH CONTRAST
TECHNIQUE: Multidetector CT imaging of the abdomen and pelvis was performed
using the standard protocol following bolus administration of
intravenous contrast.
CONTRAST:  100mL OMNIPAQUE IOHEXOL 300 MG/ML  SOLN

[Series 2: abd_pel_with 5.0 b40f · axial · 0.71mm/px · z∈[-420,-5]mm · 13 of 93 slices shown, 15 images]
[im 5/93  soft-tissue]
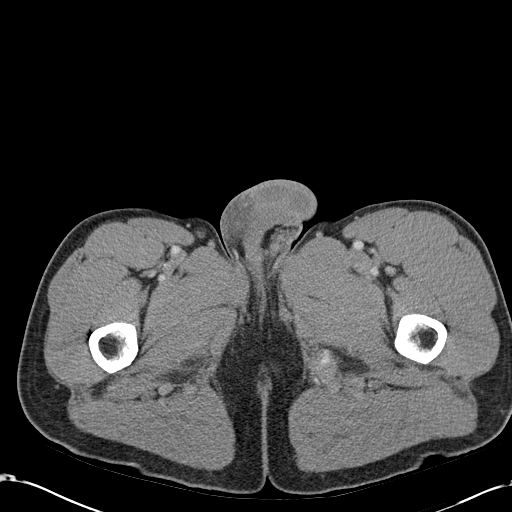
[im 5/93  bone]
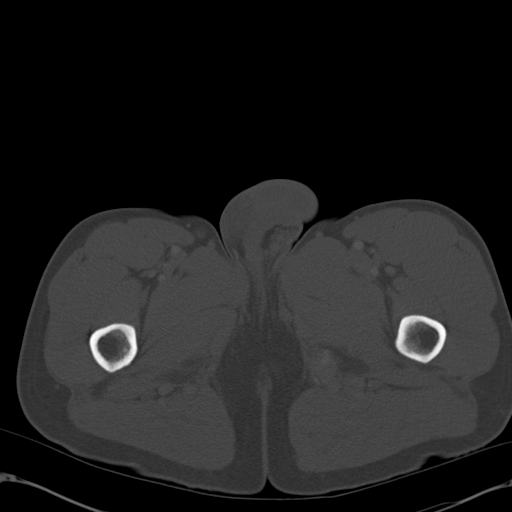
[im 14/93  soft-tissue]
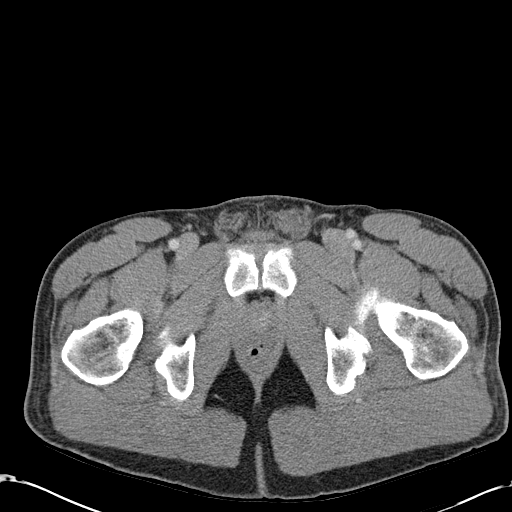
[im 19/93  soft-tissue]
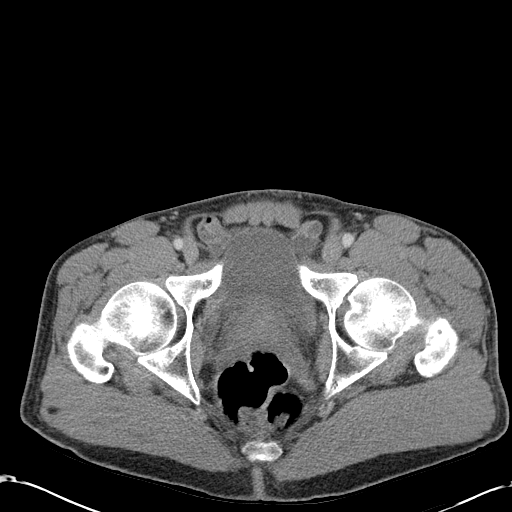
[im 28/93  soft-tissue]
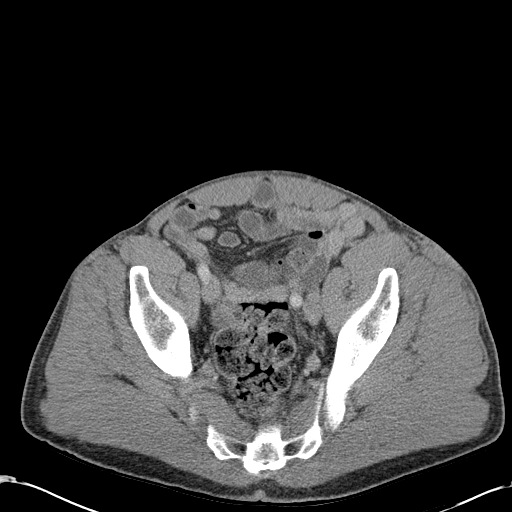
[im 33/93  soft-tissue]
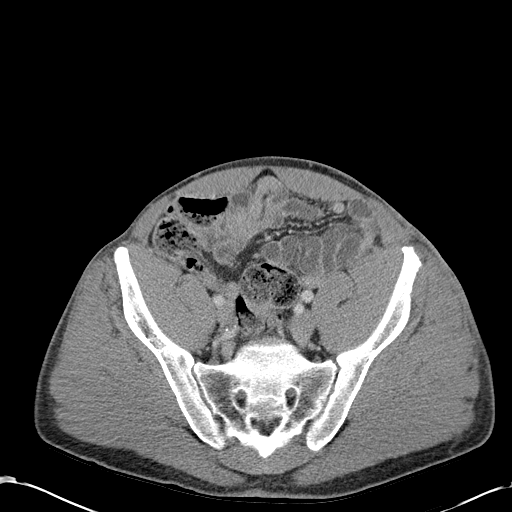
[im 42/93  soft-tissue]
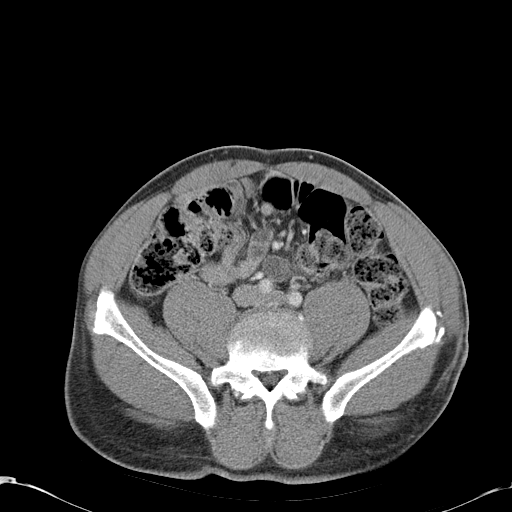
[im 47/93  soft-tissue]
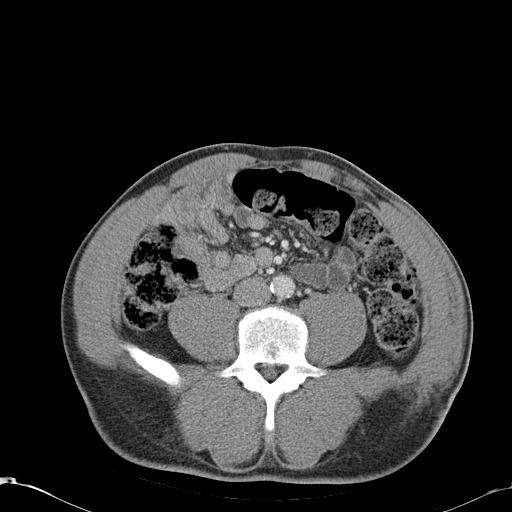
[im 51/93  soft-tissue]
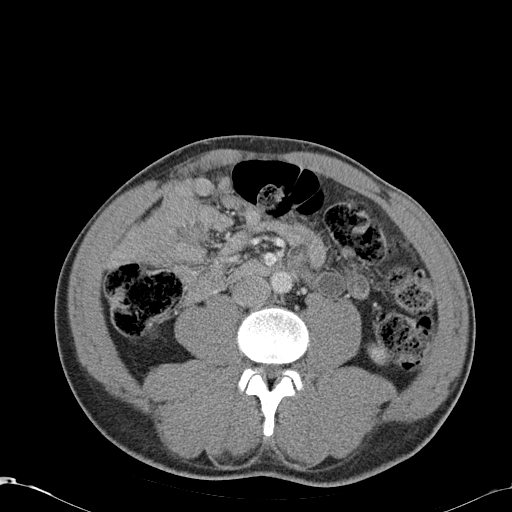
[im 60/93  soft-tissue]
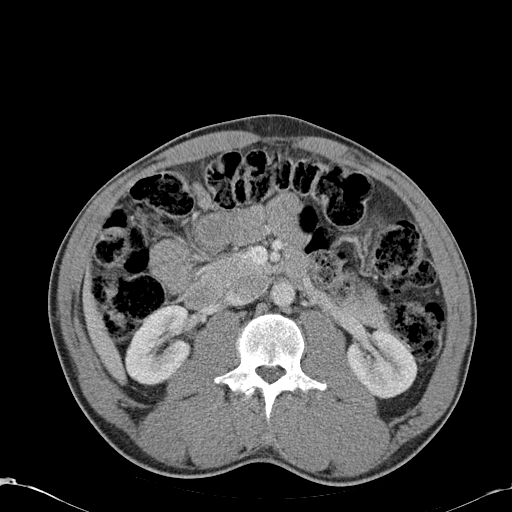
[im 60/93  bone]
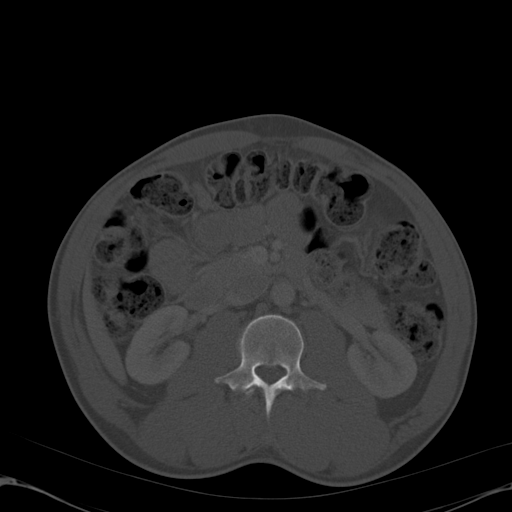
[im 65/93  soft-tissue]
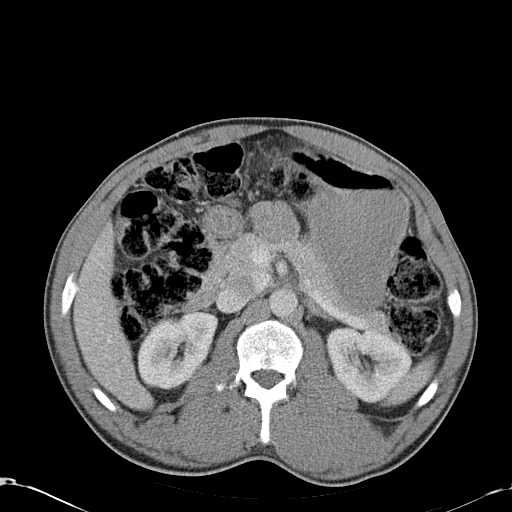
[im 74/93  soft-tissue]
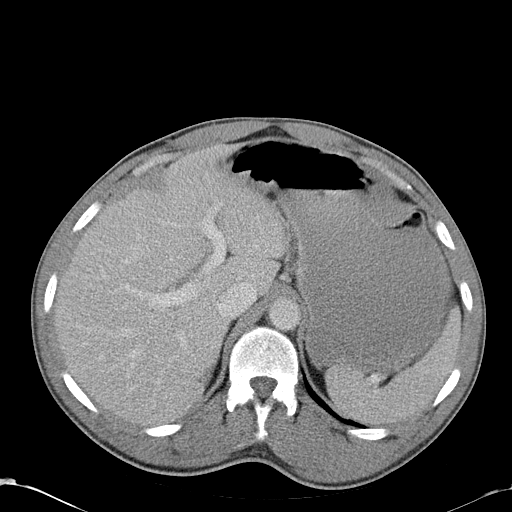
[im 79/93  soft-tissue]
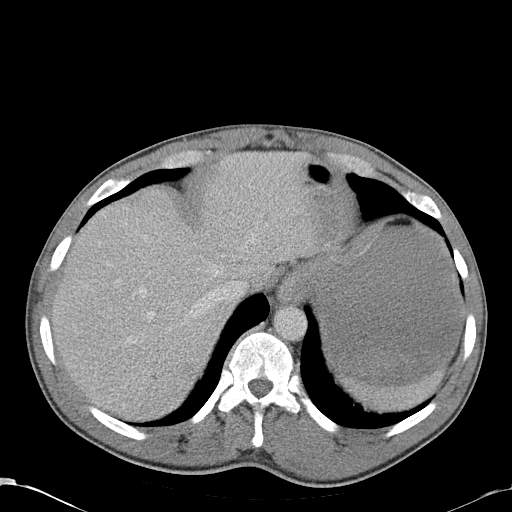
[im 88/93  soft-tissue]
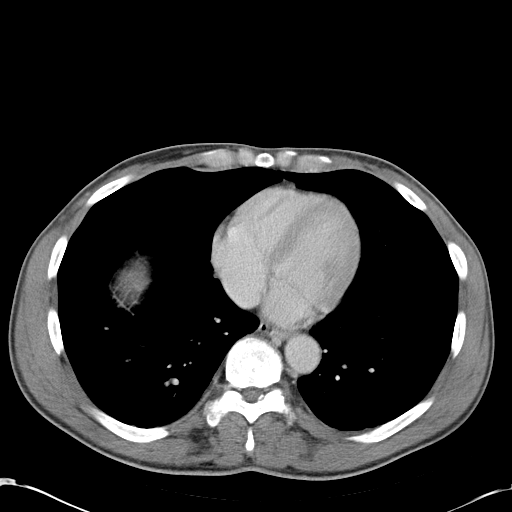

[Series 4: abd_pel_with 3.0 spo cor · coronal · 0.61mm/px · 3 of 94 slices shown]
[im 32/94  soft-tissue]
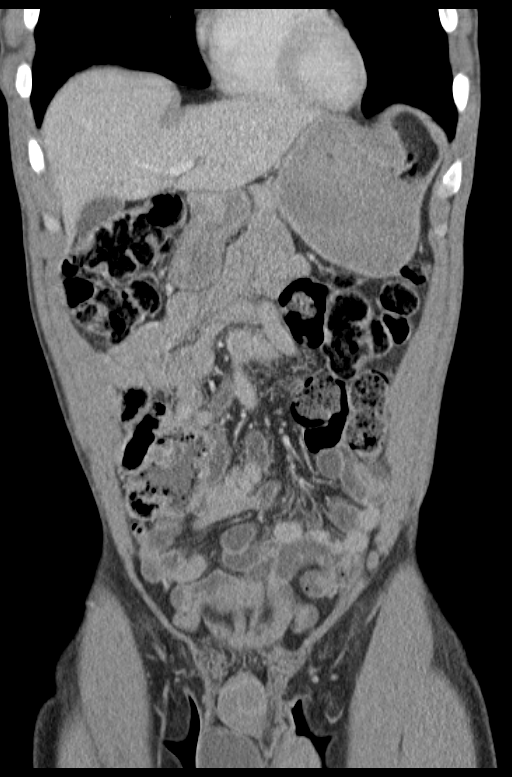
[im 42/94  soft-tissue]
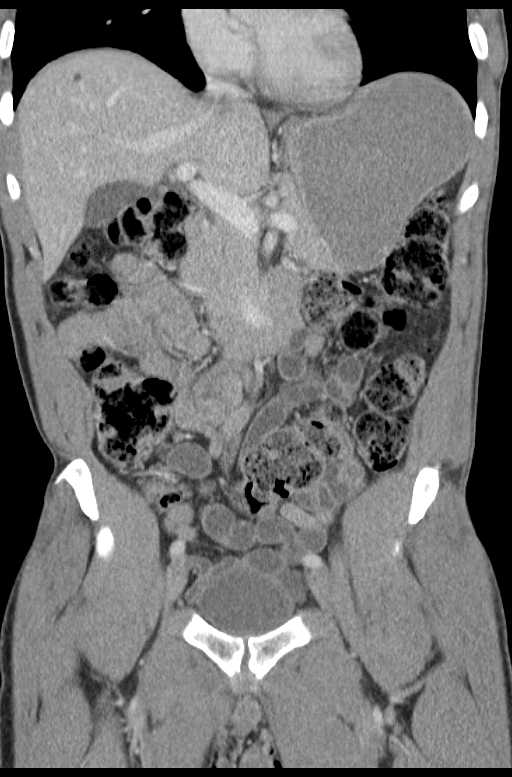
[im 52/94  soft-tissue]
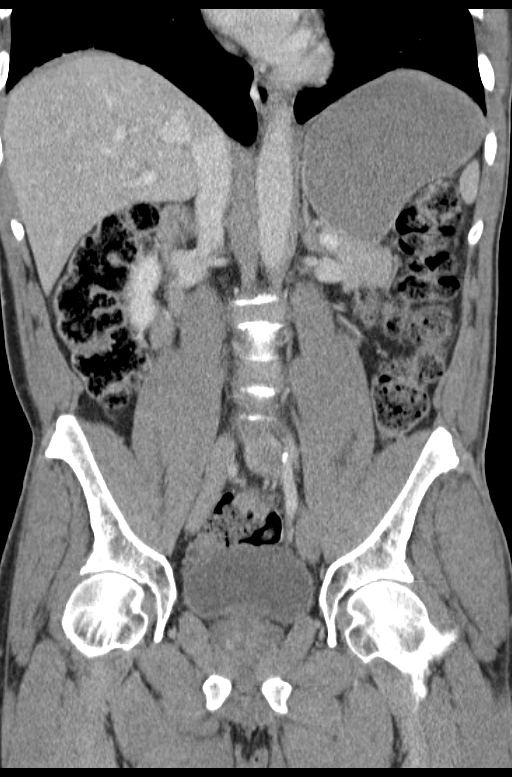

[16 of 46 positions shown; findings below may reference images not displayed]

FINDINGS: Hepatic cysts are again noted.

The spleen, adrenal glands, kidneys, gallbladder and pancreas are
unremarkable.

There is no evidence of free fluid, enlarged lymph nodes, biliary
dilation or abdominal aortic aneurysm.

A moderate to large amount of colonic stool is noted.

There is no evidence of bowel obstruction, pneumoperitoneum or
abscess.

The appendix and bladder are unremarkable.

No acute or suspicious bony abnormalities are present.
IMPRESSION: No acute abnormalities.

Moderate to large amount of colonic stool.

## 2014-01-30 MED ORDER — SODIUM CHLORIDE 0.9 % IV BOLUS (SEPSIS)
1000.0000 mL | Freq: Once | INTRAVENOUS | Status: AC
  Administered 2014-01-30: 1000 mL via INTRAVENOUS

## 2014-01-30 MED ORDER — POLYETHYLENE GLYCOL 3350 17 G PO PACK
17.0000 g | PACK | Freq: Once | ORAL | Status: AC
  Administered 2014-01-30: 17 g via ORAL
  Filled 2014-01-30: qty 1

## 2014-01-30 MED ORDER — IOHEXOL 300 MG/ML  SOLN
100.0000 mL | Freq: Once | INTRAMUSCULAR | Status: AC | PRN
  Administered 2014-01-30: 100 mL via INTRAVENOUS

## 2014-01-30 MED ORDER — ONDANSETRON HCL 4 MG/2ML IJ SOLN
4.0000 mg | Freq: Once | INTRAMUSCULAR | Status: AC
  Administered 2014-01-30: 4 mg via INTRAVENOUS
  Filled 2014-01-30: qty 2

## 2014-01-30 MED ORDER — FLEET ENEMA 7-19 GM/118ML RE ENEM
1.0000 | ENEMA | Freq: Once | RECTAL | Status: AC
  Administered 2014-01-30: 1 via RECTAL

## 2014-01-30 MED ORDER — IOHEXOL 300 MG/ML  SOLN: 50.0000 mL | Freq: Once | INTRAMUSCULAR | Status: DC | PRN

## 2014-01-30 MED ORDER — FENTANYL CITRATE 0.05 MG/ML IJ SOLN
75.0000 ug | Freq: Once | INTRAMUSCULAR | Status: AC
  Administered 2014-01-30: 75 ug via INTRAVENOUS
  Filled 2014-01-30: qty 2

## 2014-01-30 MED ORDER — POLYETHYLENE GLYCOL 3350 17 G PO PACK: 17.0000 g | PACK | Freq: Every day | ORAL | Status: DC

## 2014-01-30 NOTE — ED Provider Notes (Signed)
CSN: [id number]     Arrival date & time 01/29/14  2200 History   First MD Initiated Contact with Patient 01/30/14 0054     Chief Complaint  Patient presents with  . Abdominal Pain  . Nausea     (Consider location/radiation/quality/duration/timing/severity/associated sxs/prior Treatment) HPI Comments: 63 yo male with no medical hx, non smoker, no etoh, no abdo surgeries presents with acute abdo pain for 3-4 hrs with vomiting.  No fever.  No hx of similar.  Passing gas.  No cp.  Nothing improves, difficult to describe, severe ache lower.    Patient is a 63 y.o. male presenting with abdominal pain. The history is provided by the patient.  Abdominal Pain Associated symptoms: nausea and vomiting   Associated symptoms: no chest pain, no chills, no dysuria, no fever and no shortness of breath     Past Medical History  Diagnosis Date  . Acid reflux   . Enlarged prostate    Past Surgical History  Procedure Laterality Date  . Hand surgery      bilateral, got caught in a machine, two different occasions, right hand originally, than left hand. Left hand with skin graft  . Spinal cord decompression    . Colonoscopy  12/29/2005    JEH:[id number] hemorrhoids, anal papilla/Diminutive polyp on a stalk at 10 cm/otherwise normal rectum and colon, PATH: hyperplastic polyp  . Esophagogastroduodenoscopy  12/29/2005    OVZ:CHYIFOYDXAJ Schatzki's ring, small hiatal hernia, normal gastric mucosa, normal D1 and D2.   . Colonoscopy N/A 04/10/2013    Procedure: COLONOSCOPY;  Surgeon: Corbin Ade, MD;  Location: [facility];  Service: Endoscopy;  Laterality: N/A;  2:00   Family History  Problem Relation Age of Onset  . Colon cancer Brother     42s   History  Substance Use Topics  . Smoking status: Never Smoker   . Smokeless tobacco: Not on file  . Alcohol Use: No    Review of Systems  Constitutional: Negative for fever and chills.  HENT: Negative for congestion.   Eyes: Negative for visual  disturbance.  Respiratory: Negative for shortness of breath.   Cardiovascular: Negative for chest pain.  Gastrointestinal: Positive for nausea, vomiting and abdominal pain. Negative for blood in stool.  Genitourinary: Negative for dysuria and flank pain.  Musculoskeletal: Negative for back pain, neck pain and neck stiffness.  Skin: Negative for rash.  Neurological: Negative for light-headedness and headaches.      Allergies  Review of patient's allergies indicates no known allergies.  Home Medications   Current Outpatient Rx  Name  Route  Sig  Dispense  Refill  . Linaclotide (LINZESS) 290 MCG CAPS   Oral   Take 1 capsule by mouth daily. 30 minutes before breakfast.   30 capsule   3   . Multiple Vitamins-Minerals (MULTIVITAMINS THER. W/MINERALS) TABS   Oral   Take 1 tablet by mouth daily.         Marland Kitchen omeprazole (PRILOSEC) 20 MG capsule   Oral   Take 1 capsule (20 mg total) by mouth daily.   30 capsule   11   . polyethylene glycol (MIRALAX / GLYCOLAX) packet   Oral   Take 17 g by mouth daily.   10 each   0   . polyethylene glycol-electrolytes (TRILYTE) 420 G solution   Oral   Take 4,000 mLs by mouth as directed.   4000 mL   0   . tamsulosin (FLOMAX) 0.4 MG CAPS   Oral  Take 0.4 mg by mouth daily.          BP 121/74  Pulse 86  Temp(Src) 99.8 F (37.7 C) (Oral)  Resp 18  Ht 5\' 11"  (1.803 m)  Wt 168 lb (76.204 kg)  BMI 23.44 kg/m2  SpO2 95% Physical Exam  Nursing note and vitals reviewed. Constitutional: He is oriented to person, place, and time. He appears well-developed and well-nourished.  HENT:  Head: Normocephalic and atraumatic.  Eyes: Conjunctivae are normal. Right eye exhibits no discharge. Left eye exhibits no discharge.  Neck: Normal range of motion. Neck supple. No tracheal deviation present.  Cardiovascular: Normal rate and regular rhythm.   Pulmonary/Chest: Effort normal and breath sounds normal.  Abdominal: Soft. He exhibits no  distension. There is tenderness (lower bilateral, mild voluntary guarding). There is no guarding.  Musculoskeletal: He exhibits no edema.  Neurological: He is alert and oriented to person, place, and time.  Skin: Skin is warm. No rash noted.  Psychiatric: He has a normal mood and affect.    ED Course  Procedures (including critical care time) EMERGENCY DEPARTMENT ULTRASOUND  Study: Limited Retroperitoneal Ultrasound of the Abdominal Aorta.  INDICATIONS:Abdominal pain and Age>55 Multiple views of the abdominal aorta were obtained in real-time from the diaphragmatic hiatus to the aortic bifurcation in transverse planes with a multi-frequency probe. PERFORMED BY: Myself IMAGES ARCHIVED?: Yes FINDINGS: Maximum aortic dimensions are 2.2 cm LIMITATIONS:  Bowel gas INTERPRETATION:  No abdominal aortic aneurysm      Labs Review Labs Reviewed  CBC WITH DIFFERENTIAL - Abnormal; Notable for the following:    Neutrophils Relative % 86 (*)    Lymphocytes Relative 9 (*)    Lymphs Abs 0.5 (*)    All other components within normal limits  COMPREHENSIVE METABOLIC PANEL - Abnormal; Notable for the following:    Glucose, Bld 126 (*)    AST 45 (*)    GFR calc non Af Amer 68 (*)    GFR calc Af Amer 79 (*)    All other components within normal limits  URINALYSIS, ROUTINE W REFLEX MICROSCOPIC  I-STAT CG4 LACTIC ACID, ED   Imaging Review Ct Abdomen Pelvis W Contrast  01/30/2014   CLINICAL DATA:  63 year old male with abdominal and pelvic pain and vomiting.  EXAM: CT ABDOMEN AND PELVIS WITH CONTRAST  TECHNIQUE: Multidetector CT imaging of the abdomen and pelvis was performed using the standard protocol following bolus administration of intravenous contrast.  CONTRAST:  136mL OMNIPAQUE IOHEXOL 300 MG/ML  SOLN  COMPARISON:  01/02/2006 ultrasound and 10/27/2005 CT  FINDINGS: Hepatic cysts are again noted.  The spleen, adrenal glands, kidneys, gallbladder and pancreas are unremarkable.  There is no  evidence of free fluid, enlarged lymph nodes, biliary dilation or abdominal aortic aneurysm.  A moderate to large amount of colonic stool is noted.  There is no evidence of bowel obstruction, pneumoperitoneum or abscess.  The appendix and bladder are unremarkable.  No acute or suspicious bony abnormalities are present.  IMPRESSION: No acute abnormalities.  Moderate to large amount of colonic stool.   Electronically Signed   By: Hassan Rowan M.D.   On: 01/30/2014 02:45    EKG Interpretation   None       MDM   Final diagnoses:  Abdominal pain  Constipation    Acute abdo pain in elderly male without medical issues. Concern for appy vs bowel obstruction vs AAA vs other. Bedside US normal sized aorta Pain meds and fluids. Lactate nl. Pt improved  on recheck. CT showed constipation.   Miralax and fup with pcp.   Results and differential diagnosis were discussed with the patient. Close follow up outpatient was discussed, patient comfortable with the plan.      Mariea Clonts, MD 01/30/14 [phone]

## 2014-01-30 NOTE — ED Notes (Signed)
Pt alert & oriented x4, stable gait. Patient given discharge instructions, paperwork & prescription(s). Patient  instructed to stop at the registration desk to finish any additional paperwork. Patient verbalized understanding. Pt left department w/ no further questions. 

## 2014-01-30 NOTE — Discharge Instructions (Signed)
Take stool softener until stool soft then stop. Stay hydrated with water, eat plenty of vegetables and stay active.  If you were given medicines take as directed.  If you are on coumadin or contraceptives realize their levels and effectiveness is altered by many different medicines.  If you have any reaction (rash, tongues swelling, other) to the medicines stop taking and see a physician.   Please follow up as directed and return to the ER or see a physician for new or worsening symptoms.  Thank you.

## 2014-01-30 NOTE — ED Notes (Signed)
Pt began drinking contrast & vomited x 2. EDP notified.

## 2014-01-30 NOTE — ED Notes (Signed)
Pt ambulated to restroom & returned to room w/ no complications. 

## 2015-07-12 LAB — HEMOGLOBIN A1C: Hemoglobin A1C: 6.2

## 2016-01-15 DIAGNOSIS — K210 Gastro-esophageal reflux disease with esophagitis: Secondary | ICD-10-CM | POA: Diagnosis not present

## 2016-01-15 DIAGNOSIS — E785 Hyperlipidemia, unspecified: Secondary | ICD-10-CM | POA: Diagnosis not present

## 2016-01-15 DIAGNOSIS — N528 Other male erectile dysfunction: Secondary | ICD-10-CM | POA: Diagnosis not present

## 2016-01-15 DIAGNOSIS — N401 Enlarged prostate with lower urinary tract symptoms: Secondary | ICD-10-CM | POA: Diagnosis not present

## 2016-01-15 DIAGNOSIS — Z23 Encounter for immunization: Secondary | ICD-10-CM | POA: Diagnosis not present

## 2016-01-19 ENCOUNTER — Emergency Department (HOSPITAL_COMMUNITY)
Admission: EM | Admit: 2016-01-19 | Discharge: 2016-01-19 | Disposition: A | Discharge status: Home / Self Care | Payer: PPO | Attending: Emergency Medicine | Admitting: Emergency Medicine

## 2016-01-19 ENCOUNTER — Emergency Department (HOSPITAL_COMMUNITY): Payer: PPO

## 2016-01-19 ENCOUNTER — Encounter (HOSPITAL_COMMUNITY): Payer: Self-pay | Admitting: *Deleted

## 2016-01-19 DIAGNOSIS — M545 Low back pain: Secondary | ICD-10-CM | POA: Diagnosis not present

## 2016-01-19 DIAGNOSIS — N400 Enlarged prostate without lower urinary tract symptoms: Secondary | ICD-10-CM | POA: Insufficient documentation

## 2016-01-19 DIAGNOSIS — Z87828 Personal history of other (healed) physical injury and trauma: Secondary | ICD-10-CM | POA: Insufficient documentation

## 2016-01-19 DIAGNOSIS — M5442 Lumbago with sciatica, left side: Secondary | ICD-10-CM | POA: Diagnosis not present

## 2016-01-19 DIAGNOSIS — K219 Gastro-esophageal reflux disease without esophagitis: Secondary | ICD-10-CM | POA: Diagnosis not present

## 2016-01-19 DIAGNOSIS — M5432 Sciatica, left side: Secondary | ICD-10-CM | POA: Insufficient documentation

## 2016-01-19 DIAGNOSIS — G8929 Other chronic pain: Secondary | ICD-10-CM | POA: Insufficient documentation

## 2016-01-19 DIAGNOSIS — Z79899 Other long term (current) drug therapy: Secondary | ICD-10-CM | POA: Diagnosis not present

## 2016-01-19 DIAGNOSIS — Z9889 Other specified postprocedural states: Secondary | ICD-10-CM | POA: Diagnosis not present

## 2016-01-19 IMAGING — DX DG LUMBAR SPINE COMPLETE 4+V
5 series · 5 of 5 positions shown · non-contrast
Comparison: CT abdomen and pelvis [DATE]

CLINICAL DATA: Acute exacerbation of chronic lower left-sided back
pain radiating down the left calf beginning today. Previous spinal
cord injury [D7] after falling from roof.

EXAM:
LUMBAR SPINE - COMPLETE 4+ VIEW

[l-spine ap]
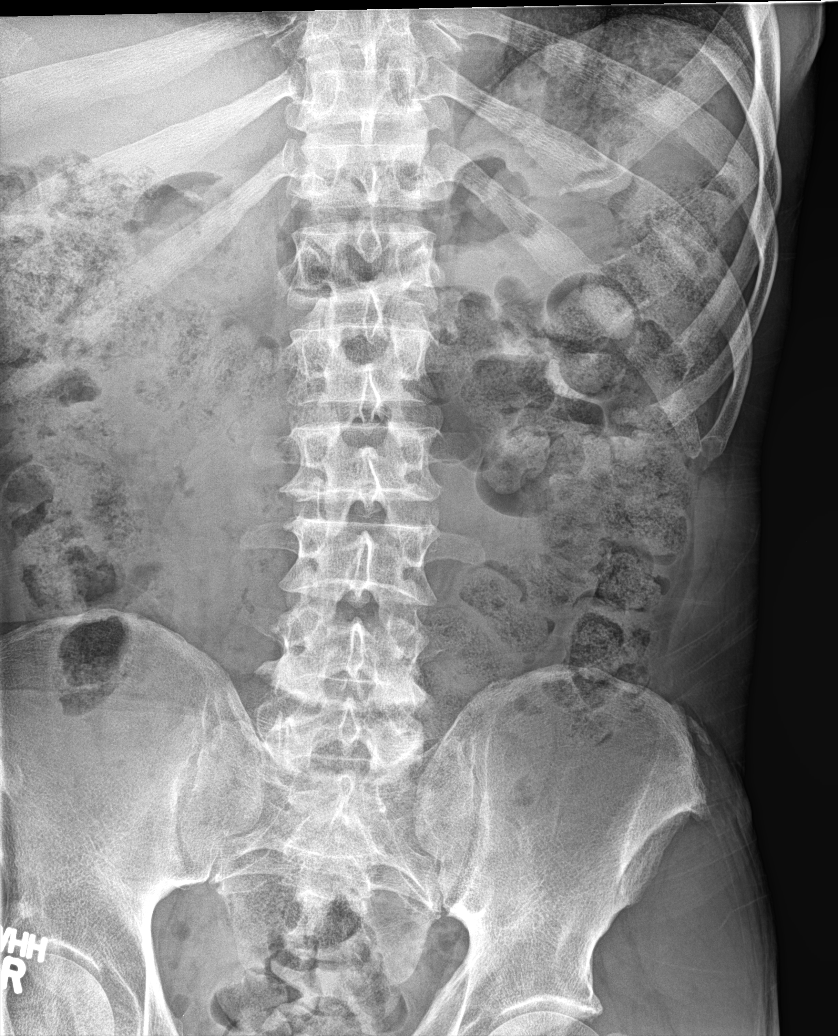

[l-spine obl (1 of 2)]
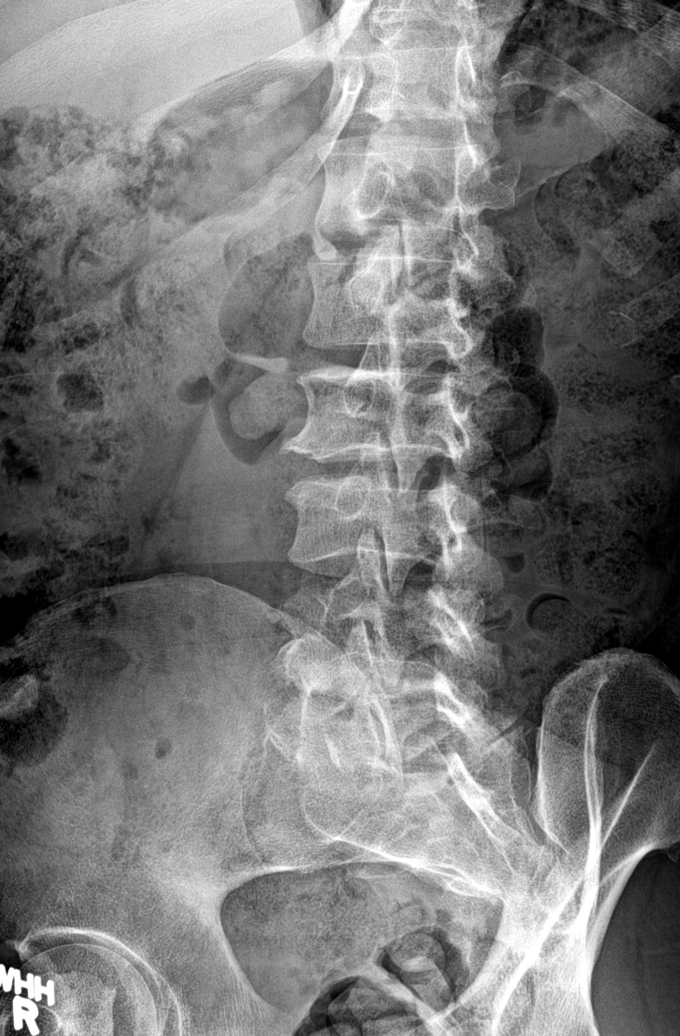

[l-spine lat]
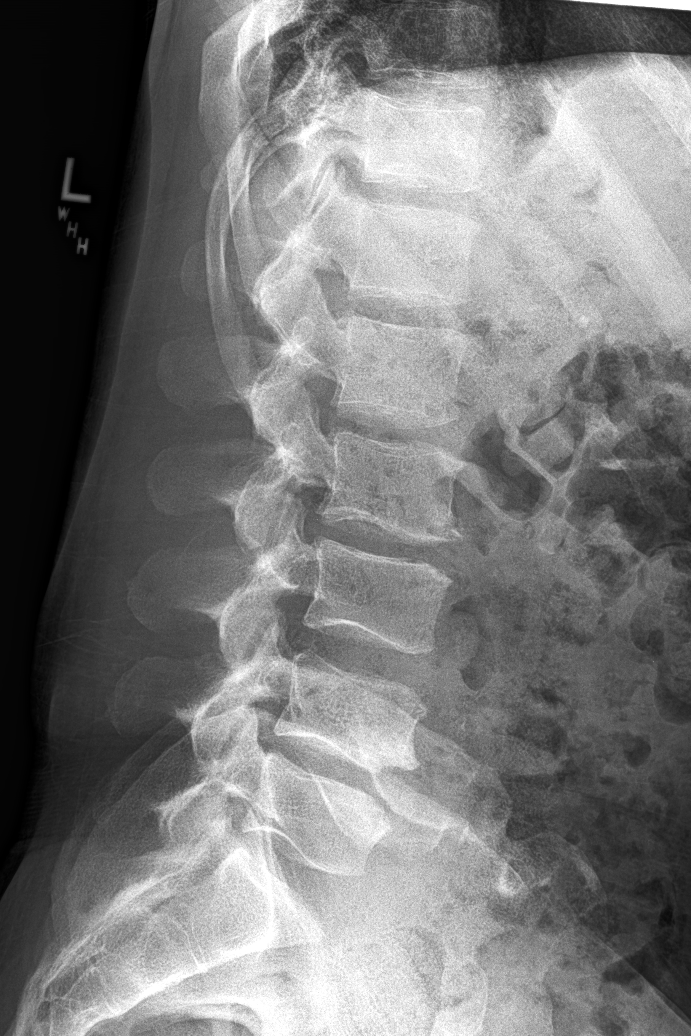

[l-spine obl (2 of 2)]
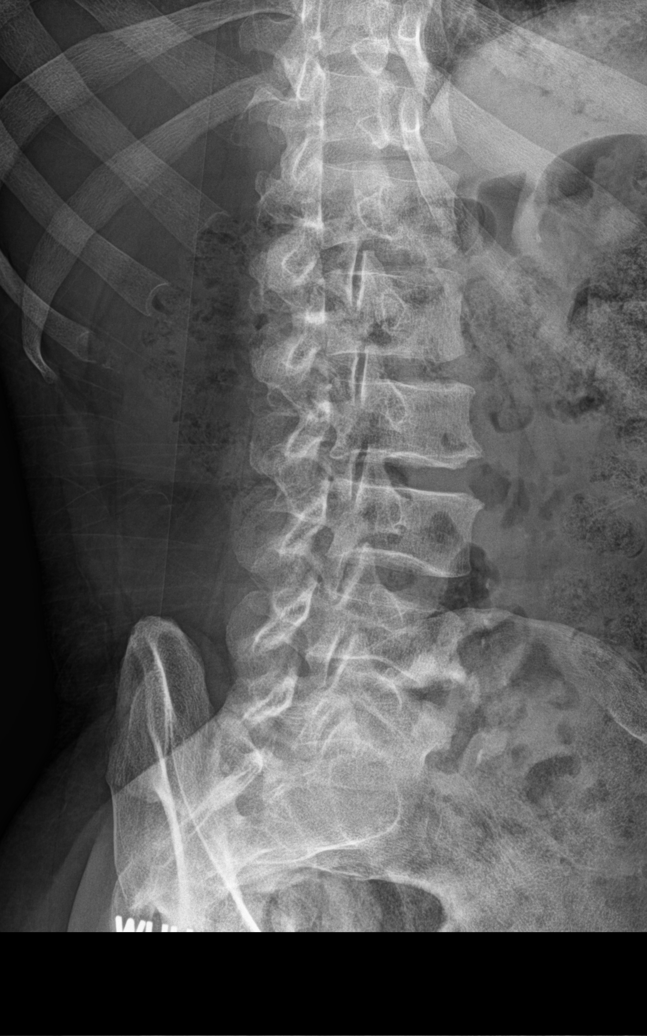

[l-spine spot]
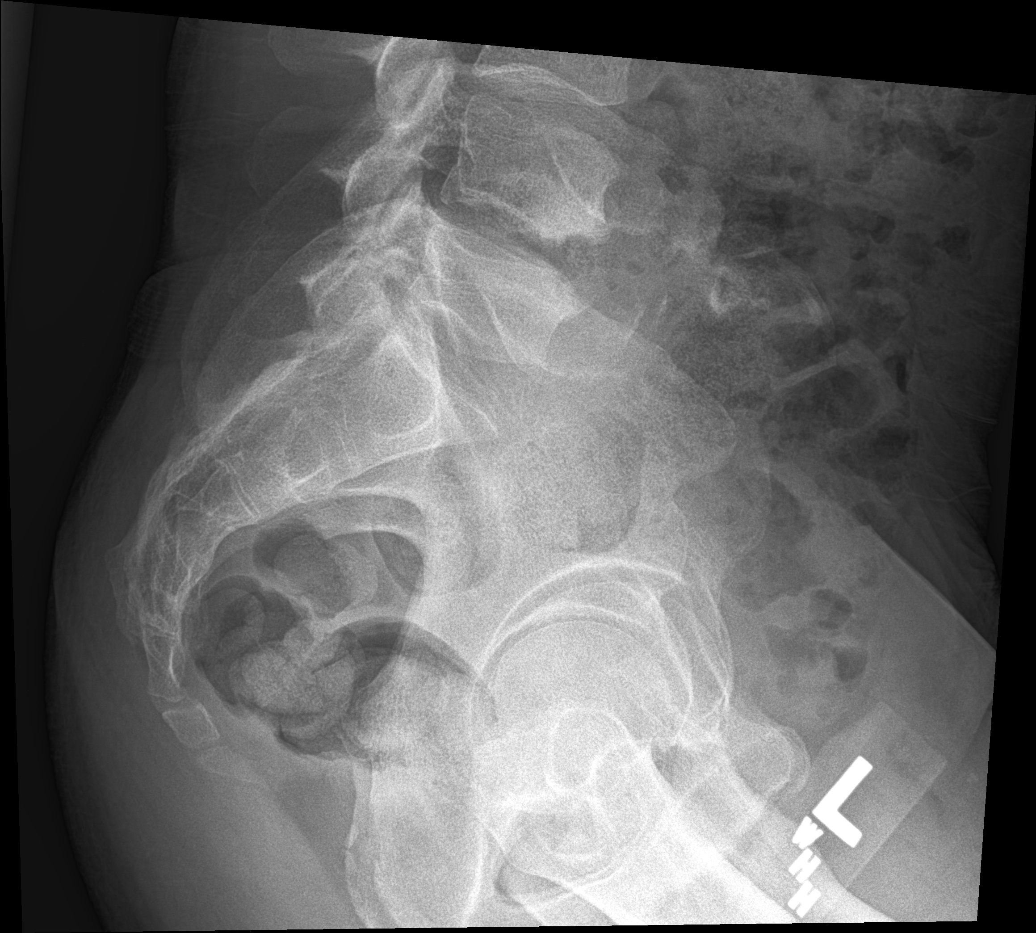

[5 of 5 positions shown; findings below may reference images not displayed]

FINDINGS: Normal alignment of the lumbar spine. Narrowed lumbar interspaces
with endplate hypertrophic changes throughout. Deformity at the
endplates adjacent to the L4-5 level. No significant changes since
previous study. No acute compression. Bone cortex appears intact.
Normal alignment of the facet joints. No destructive bone lesions.
IMPRESSION: Degenerative changes in the lumbar spine. No change in appearance
since previous study. No acute bony abnormalities.

## 2016-01-19 MED ORDER — NAPROXEN 500 MG PO TABS: 500.0000 mg | ORAL_TABLET | Freq: Two times a day (BID) | ORAL | Status: DC

## 2016-01-19 MED ORDER — CYCLOBENZAPRINE HCL 10 MG PO TABS: 10.0000 mg | ORAL_TABLET | Freq: Two times a day (BID) | ORAL | Status: DC | PRN

## 2016-01-19 MED ORDER — CYCLOBENZAPRINE HCL 10 MG PO TABS
10.0000 mg | ORAL_TABLET | Freq: Once | ORAL | Status: AC
  Administered 2016-01-19: 10 mg via ORAL
  Filled 2016-01-19: qty 1

## 2016-01-19 NOTE — ED Notes (Signed)
Pt c/o lower left sided back pain that radiates down around into his calf and foot.

## 2016-01-19 NOTE — Discharge Instructions (Signed)
Do not take the muscle relaxant if driving as it will make you sleepy. Follow up with Dr. Luan Pulling or return here for worsening symptoms.

## 2016-01-19 NOTE — ED Provider Notes (Signed)
CSN: [id number]     Arrival date & time 01/19/16  1937 History  By signing my name below, I, Stephania Fragmin, attest that this documentation has been prepared under the direction and in the presence of Debroah Baller, NP. Electronically Signed: Stephania Fragmin, ED Scribe. 01/19/2016. 9:48 PM.    Chief Complaint  Patient presents with  . Leg Pain   Patient is a 65 y.o. male presenting with back pain. The history is provided by the patient. No language interpreter was used.  Back Pain Location:  Lumbar spine Quality: tingling. Radiates to:  L foot Pain severity:  Moderate Onset quality:  Sudden Duration:  12 hours Timing:  Constant Progression:  Unchanged Chronicity:  Chronic Context: not falling, not recent illness and not recent injury   Context comment:  Sitting at church Relieved by:  None tried Worsened by:  Nothing tried Ineffective treatments:  None tried Associated symptoms: no chest pain and no dysuria   Risk factors comment:  Hx of spinal cord injury in 1995   HPI Comments: Daniel Duffy is a 65 y.o. male who presents to the Emergency Department complaining of an acute exacerbation of chronic, 8/10, tingling lower left sided back pain radiating down his left calf into his foot that began today while sitting with his legs crossed at church. He denies any recent heavy lifting, falls, or changes in activity. Patient reports he had a spinal cord injury in 1995 after falling off a roof, so he has had back problems since. He also notes a history of a blood clot in his left calf, but patient states this pain does not feel like a blood clot. He denies chest pain, SOB, bowel or bladder incontinence, or dysuria. Patient states his brother is driving him home today.   PCP: Alonza Bogus, MD   Past Medical History  Diagnosis Date  . Acid reflux   . Enlarged prostate    Past Surgical History  Procedure Laterality Date  . Hand surgery      bilateral, got caught in a machine, two different  occasions, right hand originally, than left hand. Left hand with skin graft  . Spinal cord decompression    . Colonoscopy  12/29/2005    [id number] hemorrhoids, anal papilla/Diminutive polyp on a stalk at 10 cm/otherwise normal rectum and colon, PATH: hyperplastic polyp  . Esophagogastroduodenoscopy  12/29/2005    [id number] Schatzki's ring, small hiatal hernia, normal gastric mucosa, normal D1 and D2.   . Colonoscopy N/A 04/10/2013    Procedure: COLONOSCOPY;  Surgeon: Daneil Dolin, MD;  Location: [facility];  Service: Endoscopy;  Laterality: N/A;  2:00   Family History  Problem Relation Age of Onset  . Colon cancer Brother     51s   Social History  Substance Use Topics  . Smoking status: Never Smoker   . Smokeless tobacco: None  . Alcohol Use: No    Review of Systems  Respiratory: Negative for shortness of breath.   Cardiovascular: Negative for chest pain.  Genitourinary: Negative for dysuria.  Musculoskeletal: Positive for back pain and arthralgias.       Left leg pain    Allergies  Review of patient's allergies indicates no known allergies.  Home Medications   Prior to Admission medications   Medication Sig Start Date End Date Taking? Authorizing Provider  Multiple Vitamins-Minerals (MULTIVITAMINS THER. W/MINERALS) TABS Take 1 tablet by mouth daily.   Yes Historical Provider, MD  omeprazole (PRILOSEC) 20 MG capsule Take 1 capsule (20  mg total) by mouth daily. 03/23/13  Yes Orvil Feil, NP  Probiotic Product ([place]) CAPS Take 1 capsule by mouth daily as needed (Constipation).   Yes Historical Provider, MD  tamsulosin (FLOMAX) 0.4 MG CAPS Take 0.4 mg by mouth daily.   Yes Historical Provider, MD  cyclobenzaprine (FLEXERIL) 10 MG tablet Take 1 tablet (10 mg total) by mouth 2 (two) times daily as needed for muscle spasms. 01/19/16   Hope Bunnie Pion, NP  Linaclotide (LINZESS) 290 MCG CAPS Take 1 capsule by mouth daily. 30 minutes before breakfast. 03/23/13    Orvil Feil, NP  naproxen (NAPROSYN) 500 MG tablet Take 1 tablet (500 mg total) by mouth 2 (two) times daily. 01/19/16   Hope Bunnie Pion, NP  polyethylene glycol (MIRALAX / GLYCOLAX) packet Take 17 g by mouth daily. 01/30/14   Elnora Morrison, MD  polyethylene glycol-electrolytes (TRILYTE) 420 G solution Take 4,000 mLs by mouth as directed. 03/23/13   Daneil Dolin, MD   BP 140/88 mmHg  Pulse 86  Temp(Src) 98.8 F (37.1 C) (Oral)  Resp 18  Ht 5' 11.5" (1.816 m)  Wt 77.565 kg  BMI 23.52 kg/m2  SpO2 98% Physical Exam  Constitutional: He is oriented to person, place, and time. He appears well-developed and well-nourished. No distress.  HENT:  Head: Normocephalic and atraumatic.  Eyes: Conjunctivae and EOM are normal.  Neck: Normal range of motion. Neck supple.  Cardiovascular: Normal rate and regular rhythm.   Pulmonary/Chest: Effort normal and breath sounds normal. No respiratory distress.  Abdominal: Soft. Bowel sounds are normal. There is no tenderness. There is no CVA tenderness.  Musculoskeletal: He exhibits no edema.       Lumbar back: He exhibits tenderness and spasm. He exhibits no deformity and normal pulse. Decreased range of motion: due to pain.  Deformity to both hand due to work injury. Tender over left lower lumbar area with palpation, tender over left sciatic nerve. No calf tenderness with palpation. Pedal pulses 2+, equal strength lower extremities.   Neurological: He is alert and oriented to person, place, and time. He has normal strength. No cranial nerve deficit or sensory deficit. Gait abnormal.  Reflex Scores:      Bicep reflexes are 2+ on the right side and 2+ on the left side.      Brachioradialis reflexes are 2+ on the right side and 2+ on the left side.      Patellar reflexes are 2+ on the right side and 2+ on the left side. Patient walks with a limp on his right that he reports is not new. It is due to the injury long ago when he fell from the roof.   Skin: Skin is  warm and dry.  Psychiatric: He has a normal mood and affect. His behavior is normal.  Nursing note and vitals reviewed.   ED Course  Procedures (including critical care time)  DIAGNOSTIC STUDIES: Oxygen Saturation is 98% on RA, normal by my interpretation.    COORDINATION OF CARE: 8:07 PM - Discussed treatment plan with pt at bedside which includes back XR. Pt verbalized understanding and agreed to plan.   Imaging Review Dg Lumbar Spine Complete  01/19/2016  CLINICAL DATA:  Acute exacerbation of chronic lower left-sided back pain radiating down the left calf beginning today. Previous spinal cord injury 1995 after falling from roof. EXAM: LUMBAR SPINE - COMPLETE 4+ VIEW COMPARISON:  CT abdomen and pelvis 01/30/2014 FINDINGS: Normal alignment of the lumbar spine.  Narrowed lumbar interspaces with endplate hypertrophic changes throughout. Deformity at the endplates adjacent to the L4-5 level. No significant changes since previous study. No acute compression. Bone cortex appears intact. Normal alignment of the facet joints. No destructive bone lesions. IMPRESSION: Degenerative changes in the lumbar spine. No change in appearance since previous study. No acute bony abnormalities. Electronically Signed   By: Lucienne Capers M.D.   On: 01/19/2016 21:28    MDM  65 y.o. male with low back pain that radiates to the left buttock and down the leg, stable for d/c without focal neuro deficits. Discussed with the patient clinical and x-ray findings and plan of care. All questioned fully answered.    Final diagnoses:  Sciatica, left    I personally performed the services described in this documentation, which was scribed in my presence. The recorded information has been reviewed and is accurate.     [place], [place] 01/19/16 2151  Virgel Manifold, MD 01/21/16 1344

## 2016-06-17 IMAGING — US US ABDOMEN LIMITED
1 series · 14 of 25 positions shown · non-contrast
Comparison: None.

CLINICAL DATA: Sudden onset upper abdomen pain

EXAM:
ULTRASOUND ABDOMEN LIMITED RIGHT UPPER QUADRANT

[Series 1: us abdomen limited · 0.21mm/px · 14 of 43 slices shown]
[im 1/43]
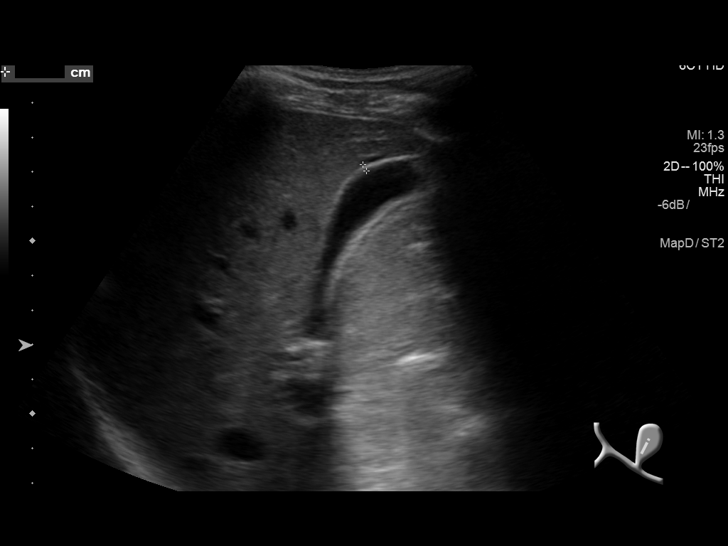
[im 4/43]
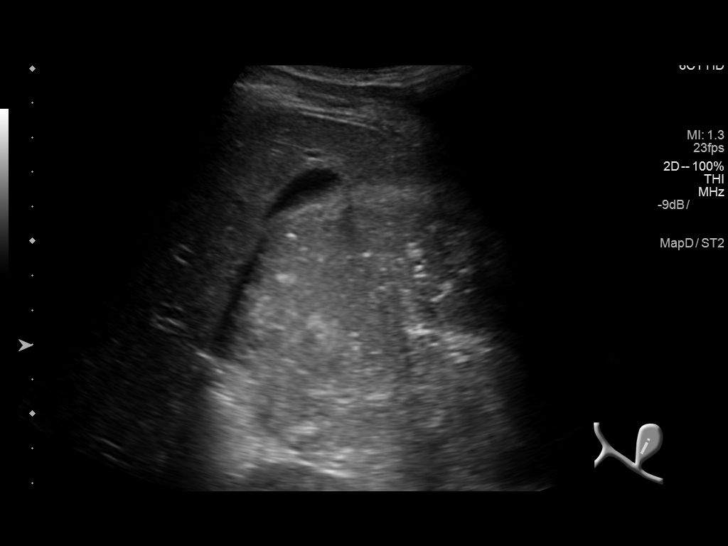
[im 8/43]
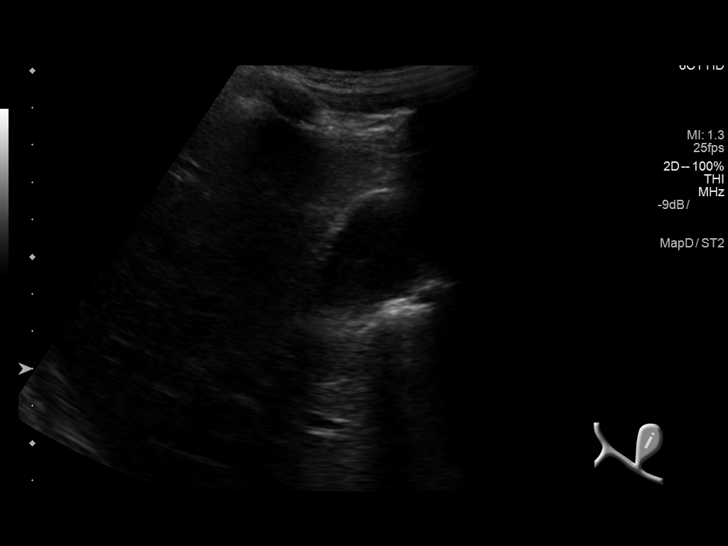
[im 11/43]
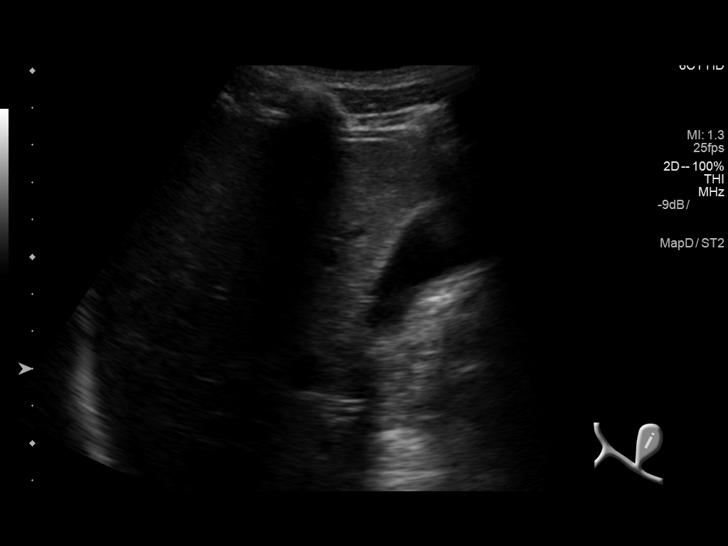
[im 15/43]
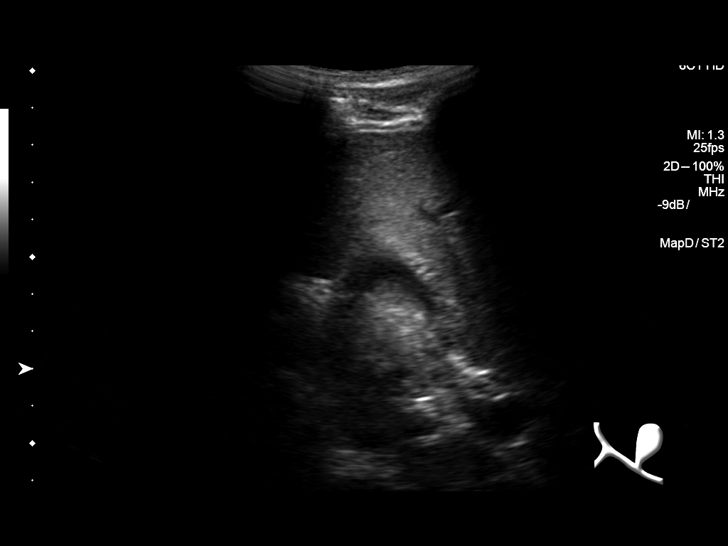
[im 16/43]
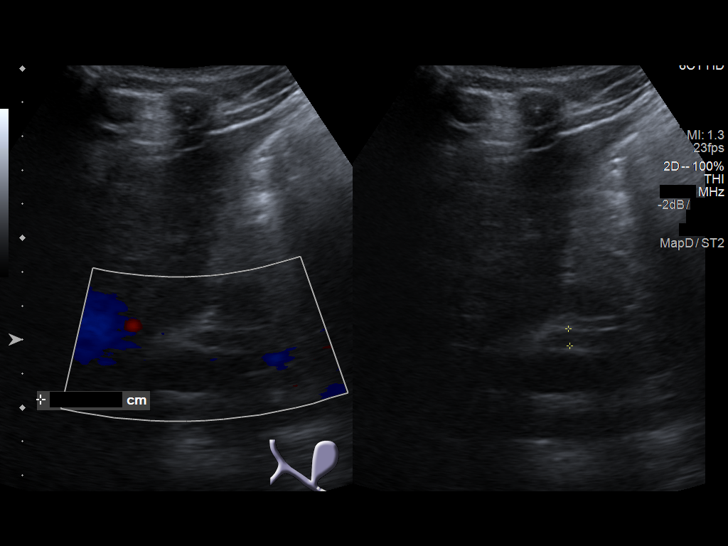
[im 20/43]
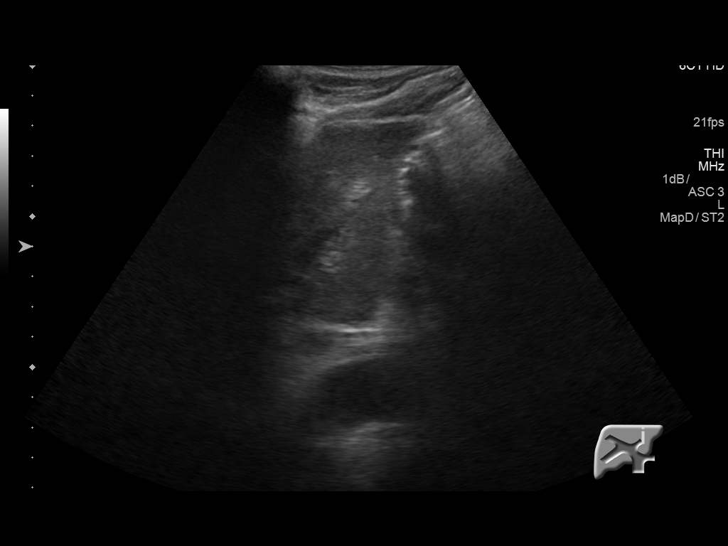
[im 23/43]
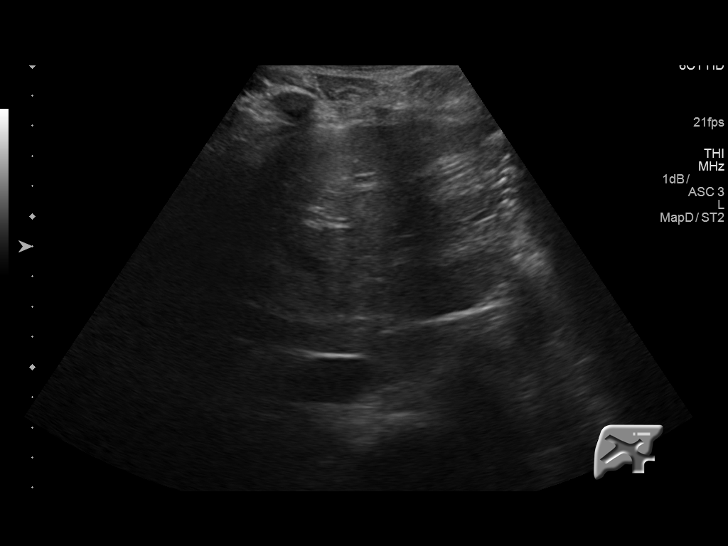
[im 27/43]
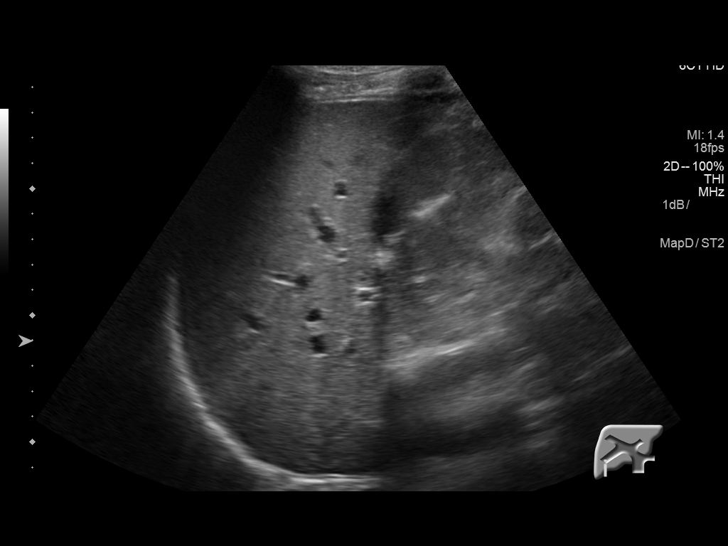
[im 29/43]
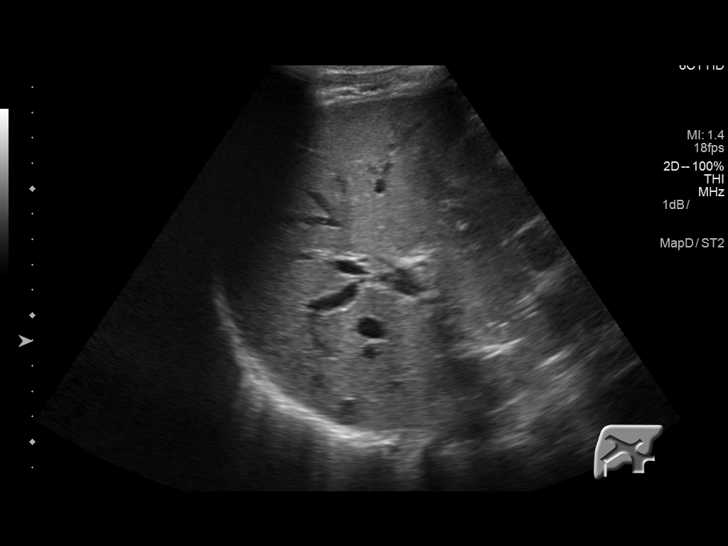
[im 32/43]
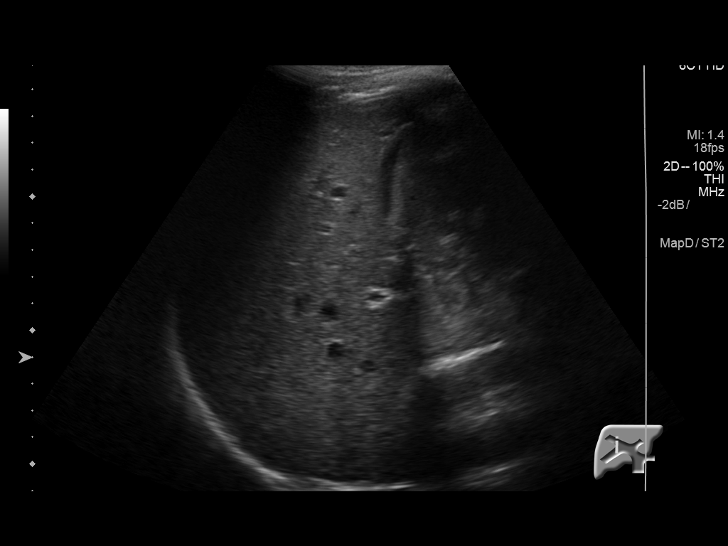
[im 36/43]
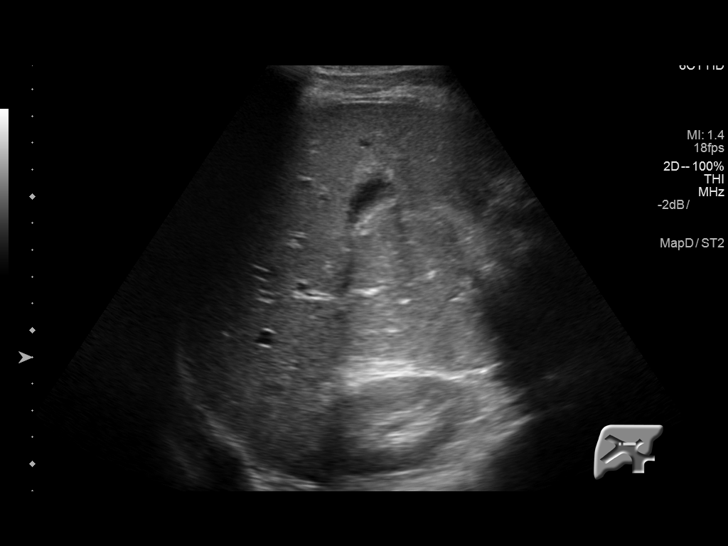
[im 39/43]
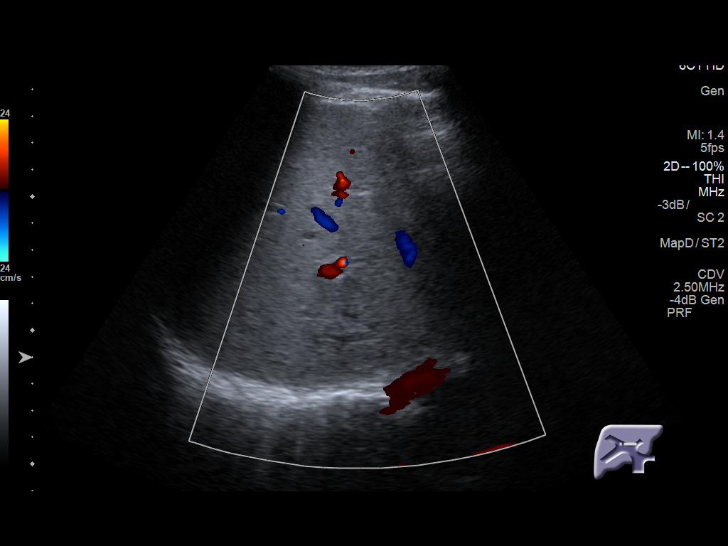
[im 43/43]
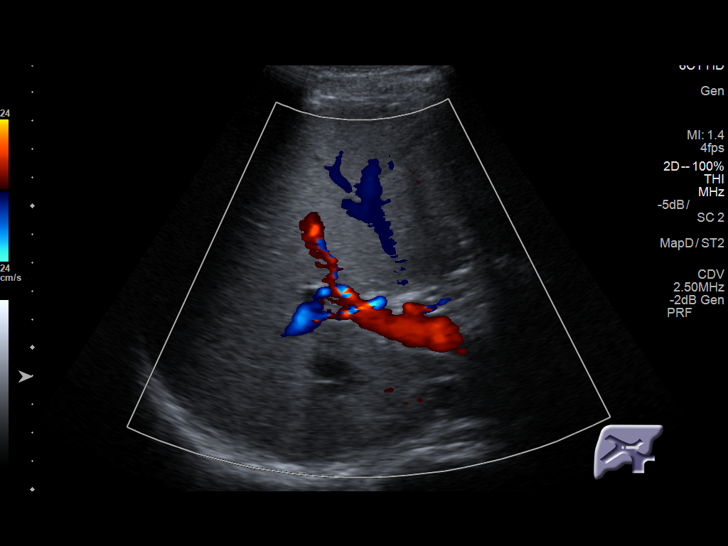

[14 of 25 positions shown; findings below may reference images not displayed]

FINDINGS: Gallbladder:

The gallbladder is partially contracted. No gallstones or wall
thickening visualized. No sonographic Murphy sign noted by
sonographer.

Common bile duct:

Diameter: 5 mm

Liver:

No focal lesion identified. Within normal limits in parenchymal
echogenicity.
IMPRESSION: Contracted gallbladder. No sonographic Murphy sign. No acute
abnormality noted.

## 2016-06-29 DIAGNOSIS — H6120 Impacted cerumen, unspecified ear: Secondary | ICD-10-CM | POA: Diagnosis not present

## 2016-06-29 DIAGNOSIS — H9190 Unspecified hearing loss, unspecified ear: Secondary | ICD-10-CM | POA: Diagnosis not present

## 2016-07-15 DIAGNOSIS — Z0000 Encounter for general adult medical examination without abnormal findings: Secondary | ICD-10-CM | POA: Diagnosis not present

## 2016-07-16 DIAGNOSIS — E785 Hyperlipidemia, unspecified: Secondary | ICD-10-CM | POA: Diagnosis not present

## 2016-07-16 DIAGNOSIS — Z125 Encounter for screening for malignant neoplasm of prostate: Secondary | ICD-10-CM | POA: Diagnosis not present

## 2016-07-16 DIAGNOSIS — N138 Other obstructive and reflux uropathy: Secondary | ICD-10-CM | POA: Diagnosis not present

## 2016-07-16 DIAGNOSIS — K432 Incisional hernia without obstruction or gangrene: Secondary | ICD-10-CM | POA: Diagnosis not present

## 2016-10-15 DIAGNOSIS — Z23 Encounter for immunization: Secondary | ICD-10-CM | POA: Diagnosis not present

## 2016-12-04 DIAGNOSIS — M79605 Pain in left leg: Secondary | ICD-10-CM | POA: Diagnosis not present

## 2016-12-15 ENCOUNTER — Other Ambulatory Visit (HOSPITAL_COMMUNITY): Payer: Self-pay | Admitting: Pulmonary Disease

## 2016-12-15 ENCOUNTER — Ambulatory Visit (HOSPITAL_COMMUNITY)
Admission: RE | Admit: 2016-12-15 | Discharge: 2016-12-15 | Disposition: A | Discharge status: Home / Self Care | Payer: PPO | Source: Ambulatory Visit | Attending: Pulmonary Disease | Admitting: Pulmonary Disease

## 2016-12-15 DIAGNOSIS — N138 Other obstructive and reflux uropathy: Secondary | ICD-10-CM | POA: Diagnosis not present

## 2016-12-15 DIAGNOSIS — M47896 Other spondylosis, lumbar region: Secondary | ICD-10-CM | POA: Diagnosis not present

## 2016-12-15 DIAGNOSIS — M545 Low back pain: Secondary | ICD-10-CM | POA: Diagnosis not present

## 2016-12-15 DIAGNOSIS — R52 Pain, unspecified: Secondary | ICD-10-CM

## 2016-12-15 DIAGNOSIS — M79605 Pain in left leg: Secondary | ICD-10-CM | POA: Diagnosis not present

## 2016-12-15 DIAGNOSIS — K5904 Chronic idiopathic constipation: Secondary | ICD-10-CM | POA: Diagnosis not present

## 2016-12-15 DIAGNOSIS — I10 Essential (primary) hypertension: Secondary | ICD-10-CM | POA: Diagnosis not present

## 2016-12-15 IMAGING — DX DG LUMBAR SPINE COMPLETE 4+V
5 series · 5 of 5 positions shown · non-contrast
Comparison: [DATE]

CLINICAL DATA: Low back pain

EXAM:
LUMBAR SPINE - COMPLETE 4+ VIEW

[l-spine ap]
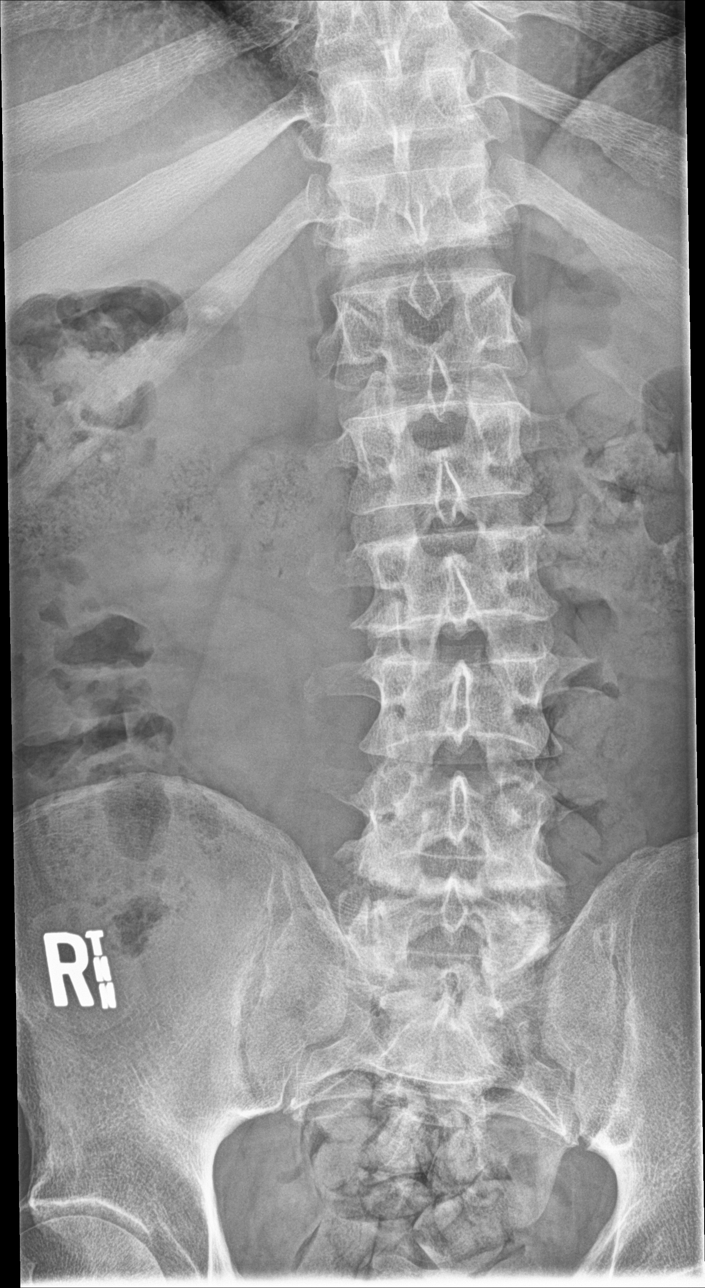

[l-spine obl (1 of 2)]
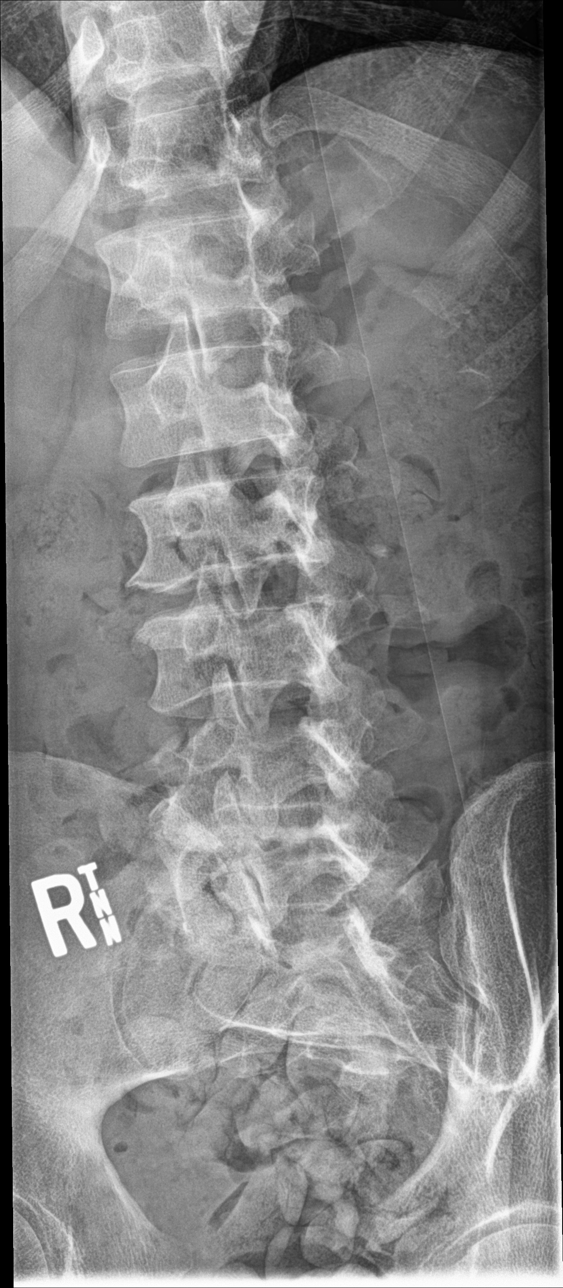

[l-spine obl (2 of 2)]
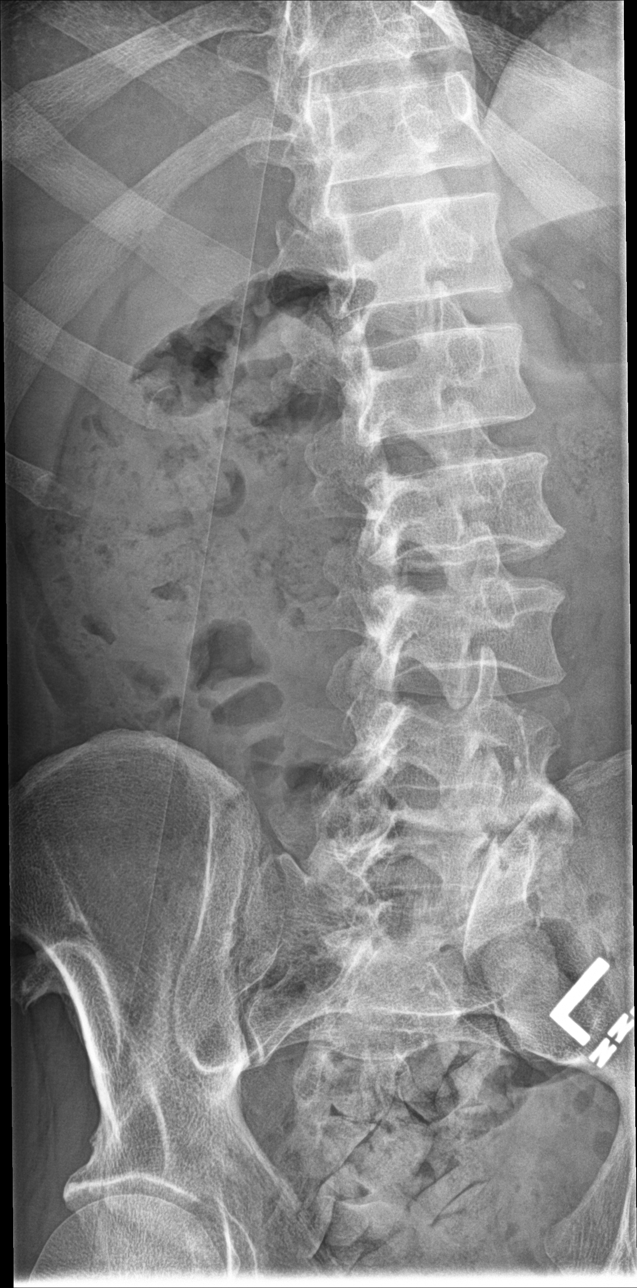

[l-spine lat]
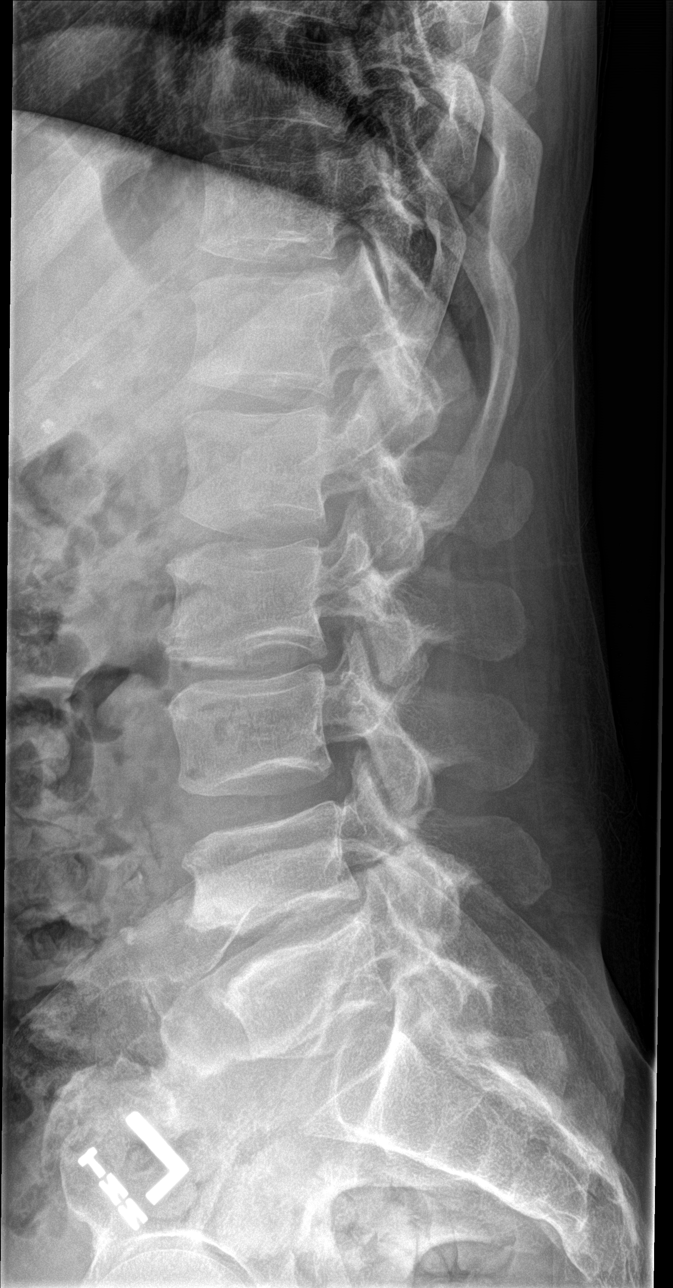

[l-spine spot]
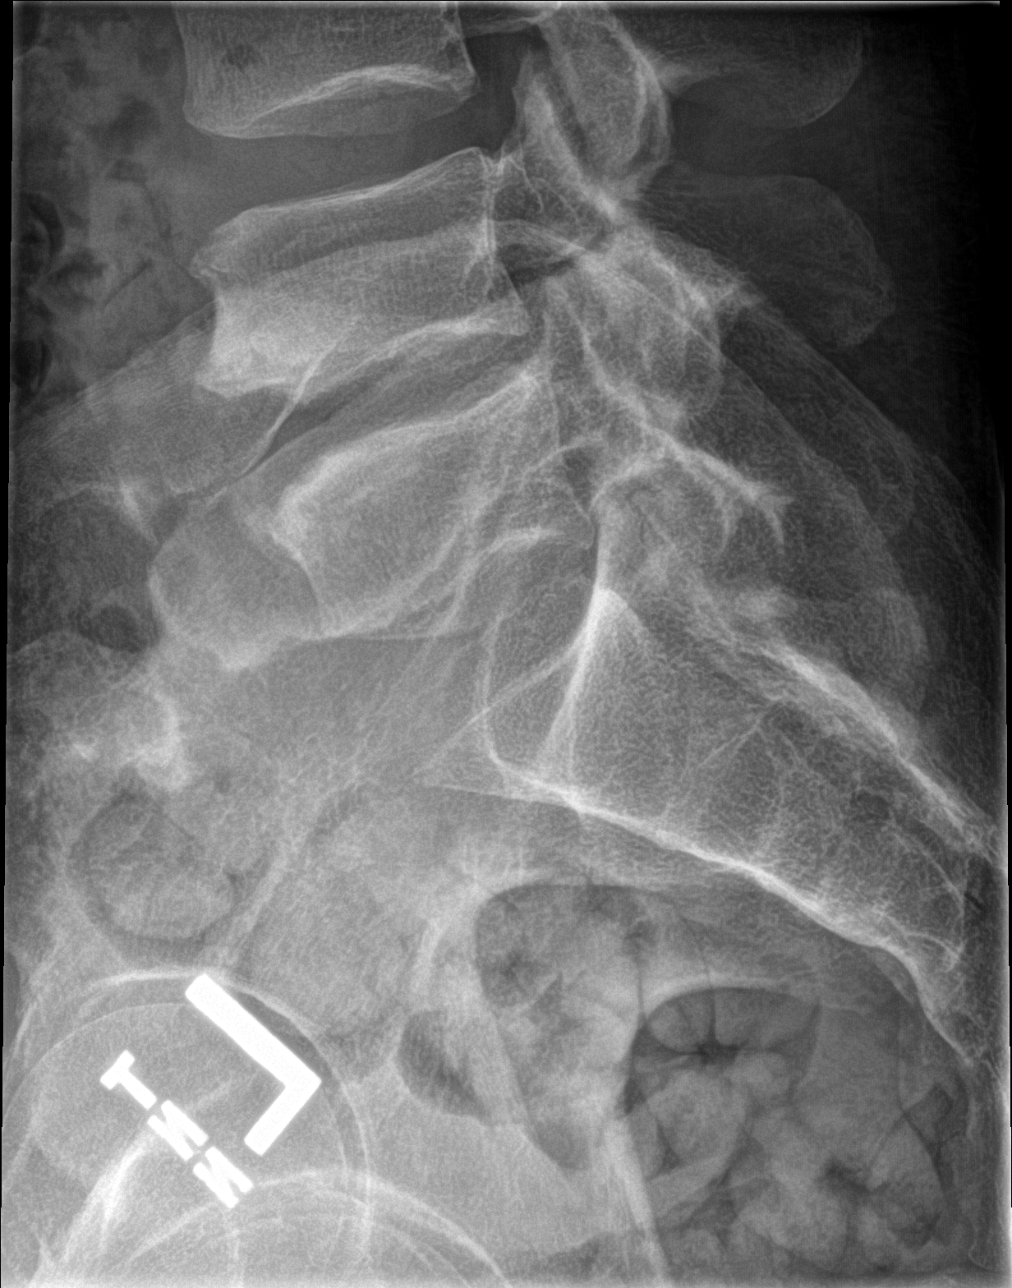

[5 of 5 positions shown; findings below may reference images not displayed]

FINDINGS: Lumbar numbering will be performed in a similar fashion to the
comparison study for consistency. The SI joints are patent. Lumbar
alignment is stable. Minimal loss of height of L4 and L5 as before.
Mild narrowing and endplate changes at L4-L5. Mild narrowing and
spurring at L1-L2 and L2-L3.
IMPRESSION: Mild multilevel degenerative changes.  No acute osseous abnormality

## 2017-01-04 ENCOUNTER — Ambulatory Visit (HOSPITAL_COMMUNITY)
Admission: RE | Admit: 2017-01-04 | Discharge: 2017-01-04 | Disposition: A | Discharge status: Home / Self Care | Payer: PPO | Source: Ambulatory Visit | Attending: Pulmonary Disease | Admitting: Pulmonary Disease

## 2017-01-04 ENCOUNTER — Other Ambulatory Visit (HOSPITAL_COMMUNITY): Payer: Self-pay | Admitting: Pulmonary Disease

## 2017-01-04 DIAGNOSIS — R05 Cough: Secondary | ICD-10-CM | POA: Insufficient documentation

## 2017-01-04 DIAGNOSIS — J90 Pleural effusion, not elsewhere classified: Secondary | ICD-10-CM | POA: Insufficient documentation

## 2017-01-04 DIAGNOSIS — J9811 Atelectasis: Secondary | ICD-10-CM | POA: Diagnosis not present

## 2017-01-04 DIAGNOSIS — R059 Cough, unspecified: Secondary | ICD-10-CM

## 2017-01-04 DIAGNOSIS — J209 Acute bronchitis, unspecified: Secondary | ICD-10-CM | POA: Diagnosis not present

## 2017-01-04 IMAGING — DX DG CHEST 2V
2 series · 2 of 2 positions shown · non-contrast
Comparison: Portable chest x-ray [DATE]

CLINICAL DATA: Two weeks of cough. History of gastroesophageal
reflux, discontinued smoking 23 years ago.

EXAM:
CHEST  2 VIEW

[chest pa]
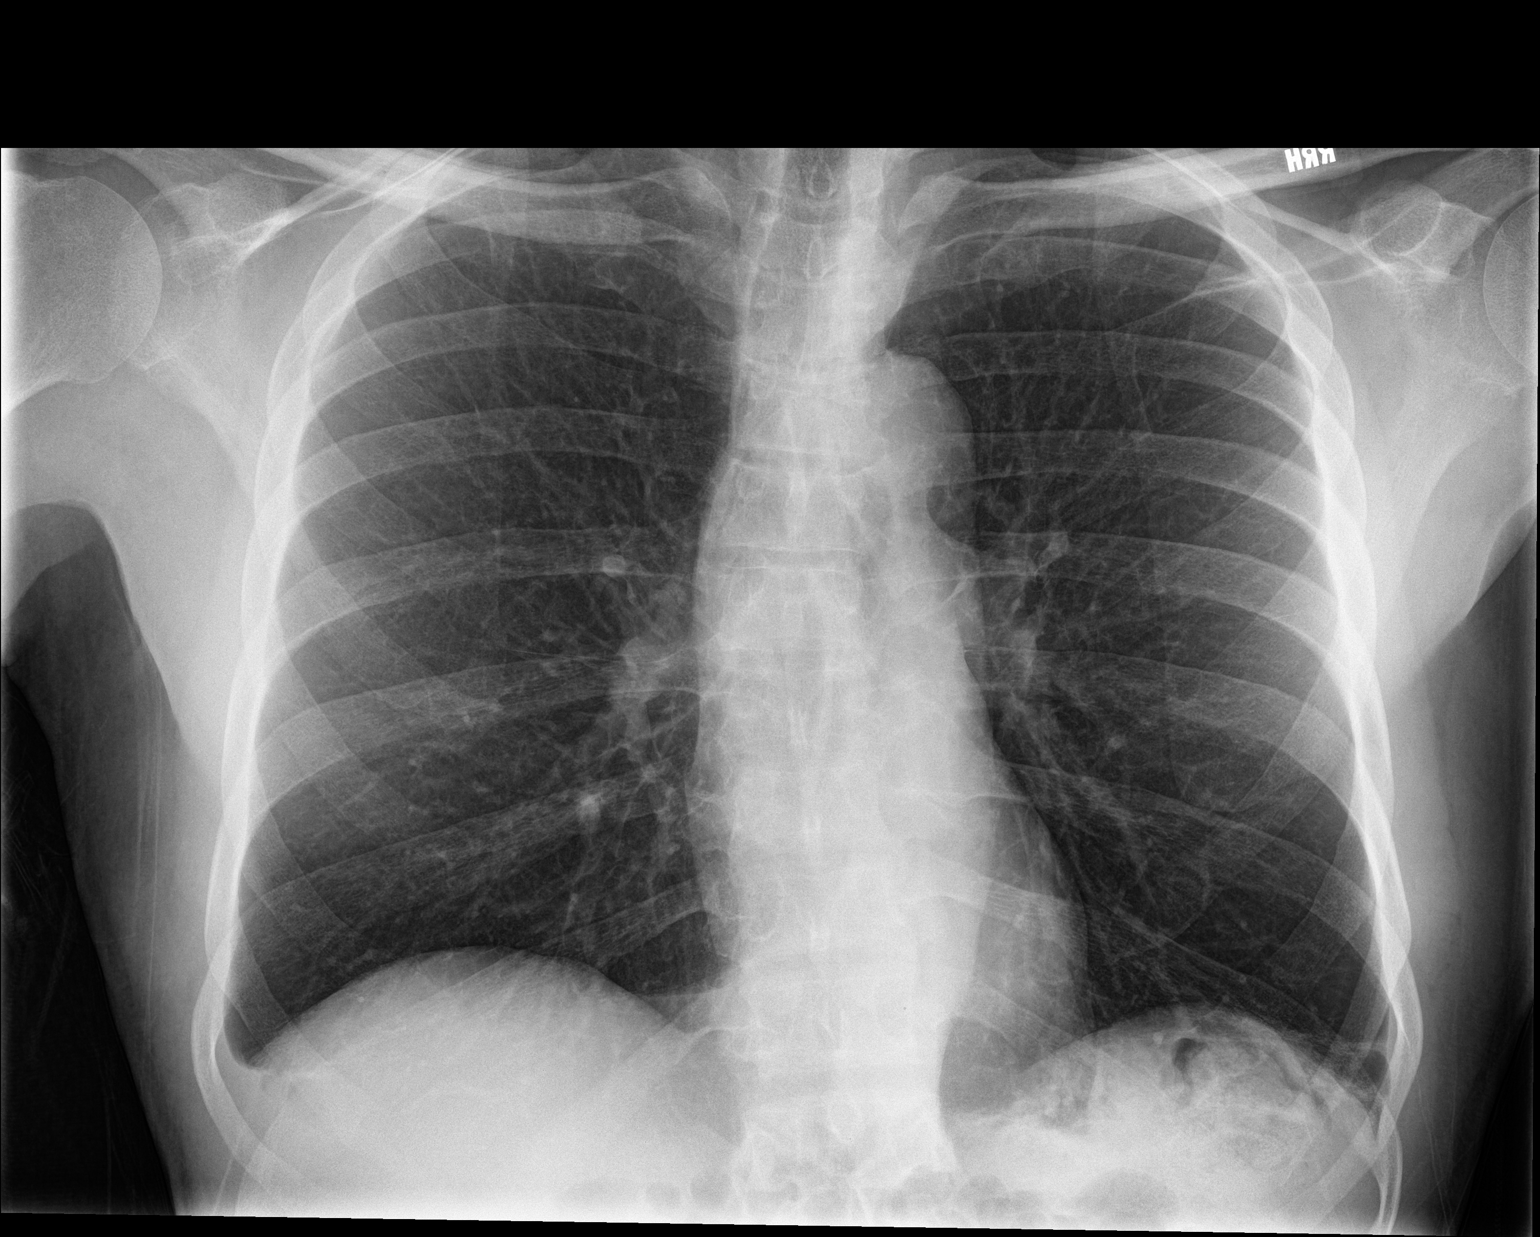

[chest lat]
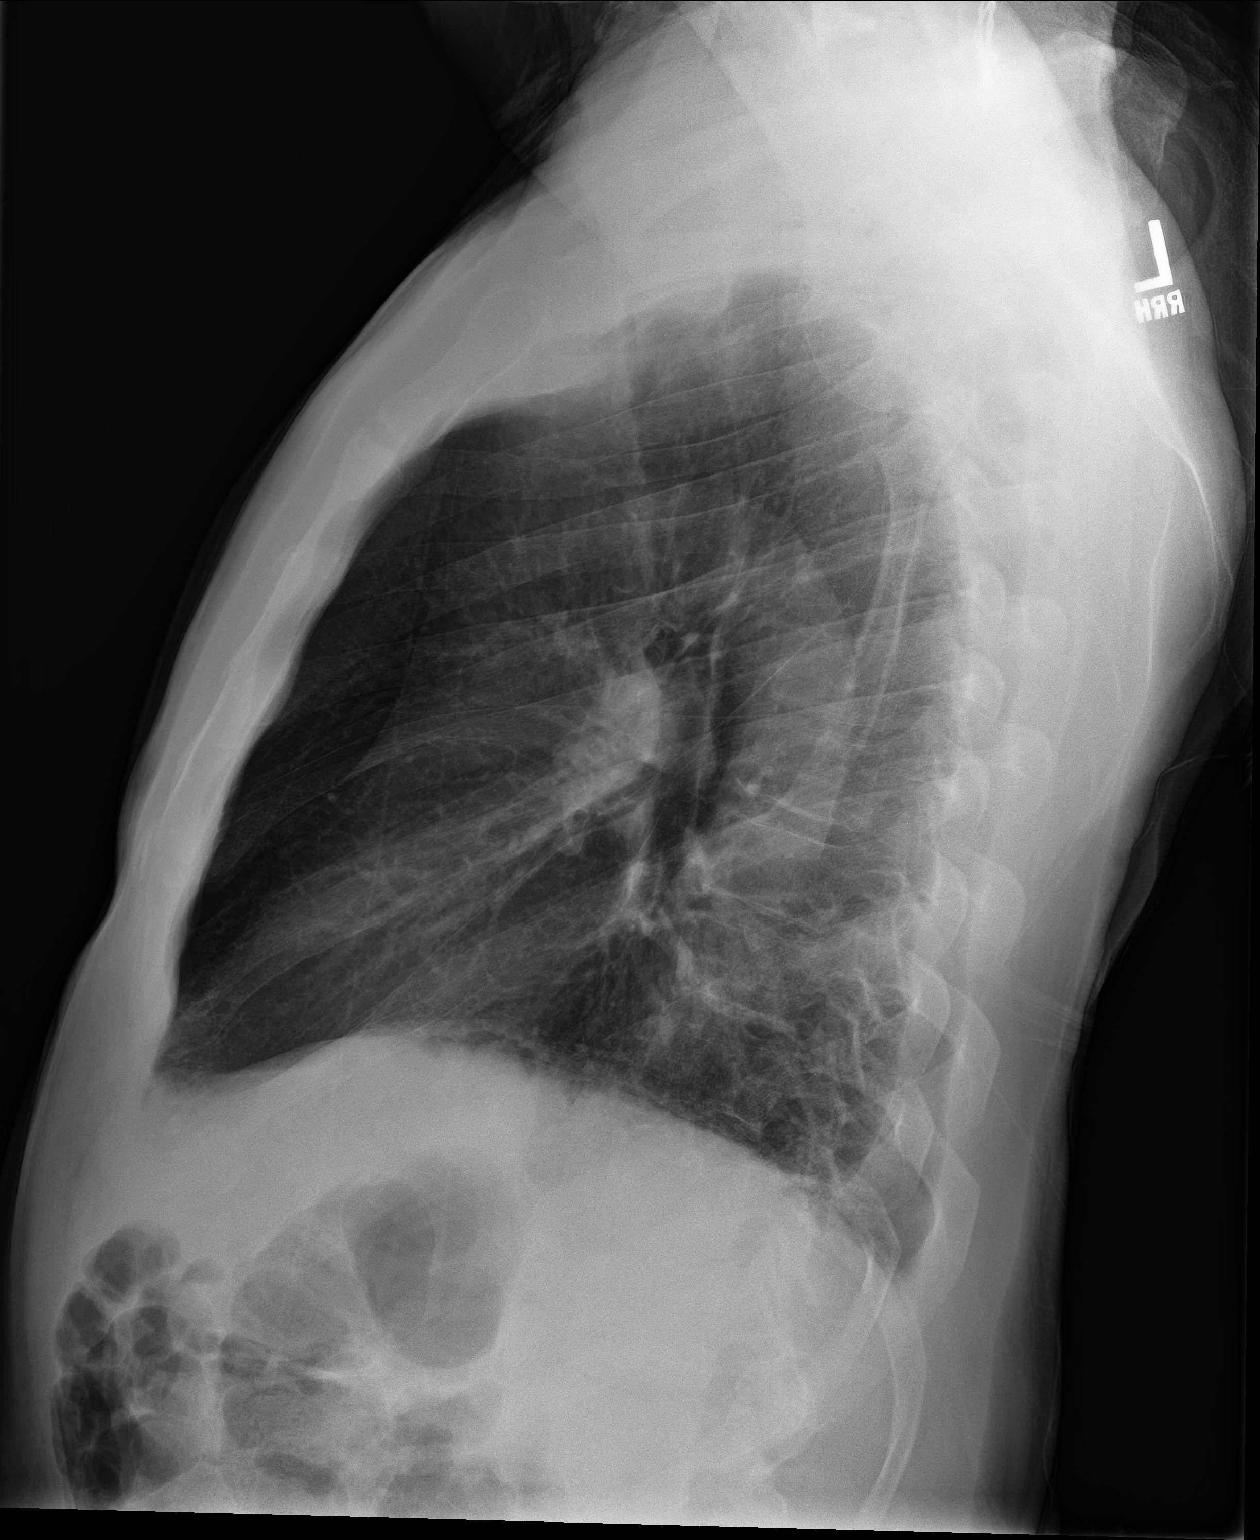

[2 of 2 positions shown; findings below may reference images not displayed]

FINDINGS: The lungs are well-expanded. There is a small right pleural effusion
and minimal atelectasis at the left lung base. The heart and
pulmonary vascularity are normal. The mediastinum is normal in
width. The bony thorax exhibits no acute abnormality.
IMPRESSION: No alveolar pneumonia nor pulmonary edema. New small right pleural
effusion. Minimal left basilar atelectasis.

## 2017-02-24 DIAGNOSIS — Z0000 Encounter for general adult medical examination without abnormal findings: Secondary | ICD-10-CM | POA: Diagnosis not present

## 2017-03-11 DIAGNOSIS — L218 Other seborrheic dermatitis: Secondary | ICD-10-CM | POA: Diagnosis not present

## 2017-04-12 DIAGNOSIS — L02512 Cutaneous abscess of left hand: Secondary | ICD-10-CM | POA: Diagnosis not present

## 2017-04-12 DIAGNOSIS — N419 Inflammatory disease of prostate, unspecified: Secondary | ICD-10-CM | POA: Diagnosis not present

## 2017-04-12 DIAGNOSIS — K5904 Chronic idiopathic constipation: Secondary | ICD-10-CM | POA: Diagnosis not present

## 2017-05-02 ENCOUNTER — Encounter (HOSPITAL_COMMUNITY): Payer: Self-pay | Admitting: *Deleted

## 2017-05-02 ENCOUNTER — Emergency Department (HOSPITAL_COMMUNITY)
Admission: EM | Admit: 2017-05-02 | Discharge: 2017-05-02 | Disposition: A | Discharge status: Home / Self Care | Payer: PPO | Attending: Emergency Medicine | Admitting: Emergency Medicine

## 2017-05-02 ENCOUNTER — Emergency Department (HOSPITAL_COMMUNITY): Payer: PPO

## 2017-05-02 DIAGNOSIS — N400 Enlarged prostate without lower urinary tract symptoms: Secondary | ICD-10-CM | POA: Diagnosis not present

## 2017-05-02 DIAGNOSIS — K5900 Constipation, unspecified: Secondary | ICD-10-CM | POA: Diagnosis not present

## 2017-05-02 DIAGNOSIS — Z79899 Other long term (current) drug therapy: Secondary | ICD-10-CM | POA: Diagnosis not present

## 2017-05-02 DIAGNOSIS — R1030 Lower abdominal pain, unspecified: Secondary | ICD-10-CM

## 2017-05-02 DIAGNOSIS — R1032 Left lower quadrant pain: Secondary | ICD-10-CM | POA: Diagnosis not present

## 2017-05-02 DIAGNOSIS — R109 Unspecified abdominal pain: Secondary | ICD-10-CM | POA: Diagnosis not present

## 2017-05-02 LAB — URINALYSIS, ROUTINE W REFLEX MICROSCOPIC
Bilirubin Urine: NEGATIVE
GLUCOSE, UA: NEGATIVE mg/dL
HGB URINE DIPSTICK: NEGATIVE
KETONES UR: NEGATIVE mg/dL
LEUKOCYTES UA: NEGATIVE
Nitrite: NEGATIVE
Protein, ur: NEGATIVE mg/dL
Specific Gravity, Urine: 1.01 (ref 1.005–1.030)
pH: 5.5 (ref 5.0–8.0)

## 2017-05-02 LAB — COMPREHENSIVE METABOLIC PANEL
ALBUMIN: 3.5 g/dL (ref 3.5–5.0)
ALK PHOS: 61 U/L (ref 38–126)
ALT: 21 U/L (ref 17–63)
ANION GAP: 6 (ref 5–15)
AST: 27 U/L (ref 15–41)
BILIRUBIN TOTAL: 0.7 mg/dL (ref 0.3–1.2)
BUN: 11 mg/dL (ref 6–20)
CALCIUM: 10 mg/dL (ref 8.9–10.3)
CO2: 25 mmol/L (ref 22–32)
Chloride: 106 mmol/L (ref 101–111)
Creatinine, Ser: 1.09 mg/dL (ref 0.61–1.24)
GFR calc Af Amer: >60 mL/min (ref >60)
GFR calc non Af Amer: >60 mL/min (ref >60)
GLUCOSE: 99 mg/dL (ref 65–99)
Potassium: 4 mmol/L (ref 3.5–5.1)
SODIUM: 137 mmol/L (ref 135–145)
Total Protein: 6.8 g/dL (ref 6.5–8.1)

## 2017-05-02 LAB — CBC
HEMATOCRIT: 41.8 % (ref 39.0–52.0)
HEMOGLOBIN: 13.7 g/dL (ref 13.0–17.0)
MCH: 27.8 pg (ref 26.0–34.0)
MCHC: 32.8 g/dL (ref 30.0–36.0)
MCV: 85 fL (ref 78.0–100.0)
Platelets: 180 10*3/uL (ref 150–400)
RBC: 4.92 MIL/uL (ref 4.22–5.81)
RDW: 14.1 % (ref 11.5–15.5)
WBC: 2.4 10*3/uL — AB (ref 4.0–10.5)

## 2017-05-02 IMAGING — CT CT ABD-PELV W/ CM
2 of 5 series · 15 of 46 positions shown, 17 images · IV contrast (Isovue)
Comparison: Abdominopelvic CT [DATE].

CLINICAL DATA: Low abdominal pain for 1 week.  Constipation.

EXAM:
CT ABDOMEN AND PELVIS WITH CONTRAST
TECHNIQUE: Multidetector CT imaging of the abdomen and pelvis was performed
using the standard protocol following bolus administration of
intravenous contrast.
CONTRAST:  100mL [LR] IOPAMIDOL ([LR]) INJECTION 61%

[Series 2: axial st · axial · 0.70mm/px · z∈[+931,+1341]mm · 12 of 94 slices shown, 14 images]
[im 6/94  soft-tissue]
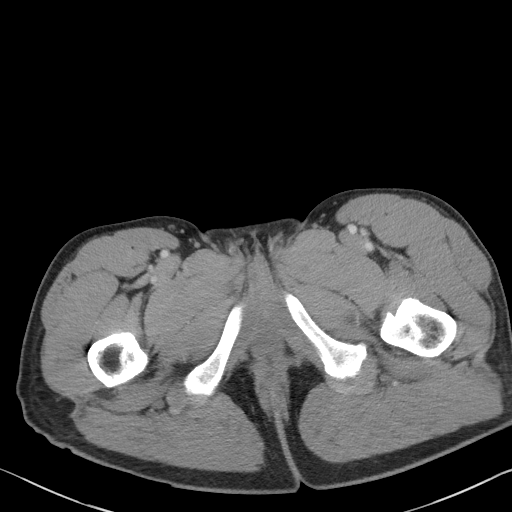
[im 6/94  bone]
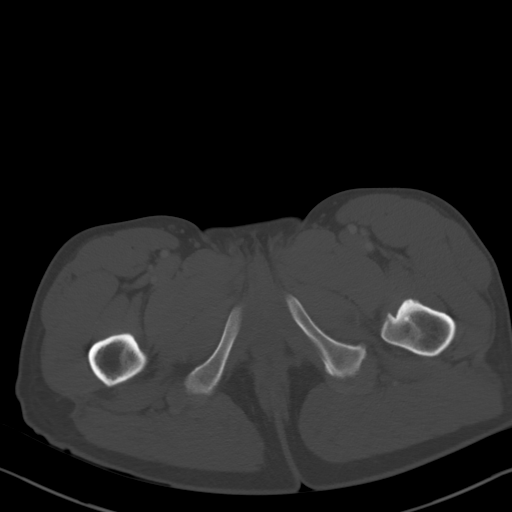
[im 16/94  soft-tissue]
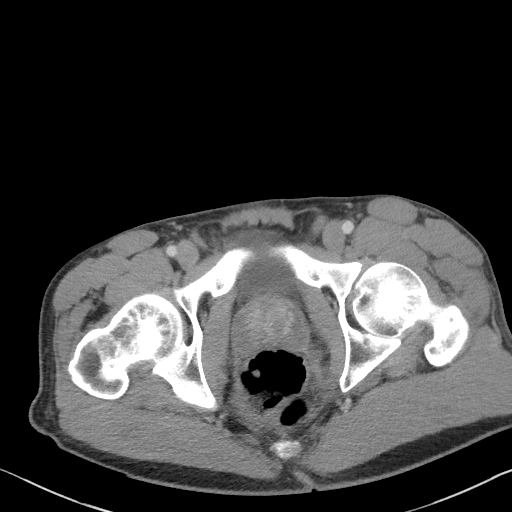
[im 21/94  soft-tissue]
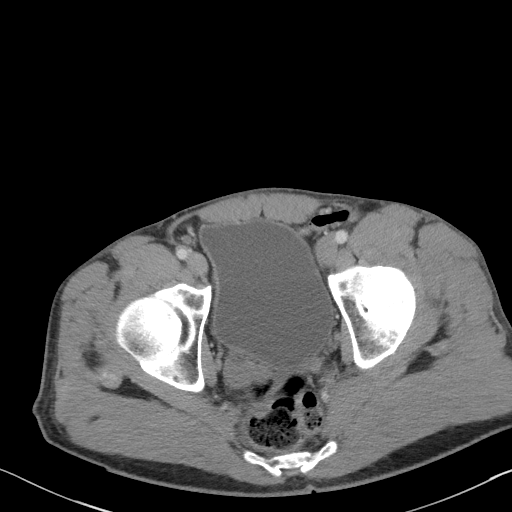
[im 26/94  soft-tissue]
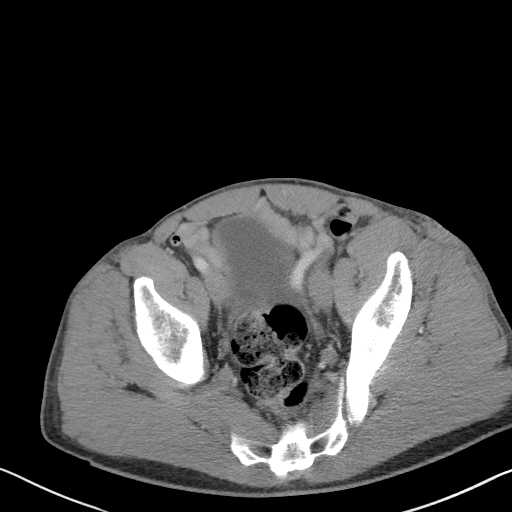
[im 37/94  soft-tissue]
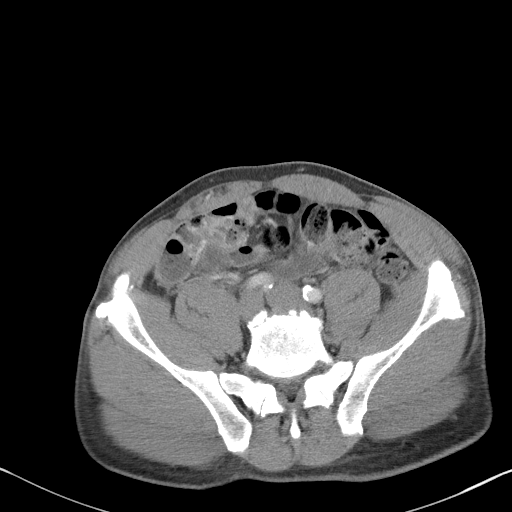
[im 42/94  soft-tissue]
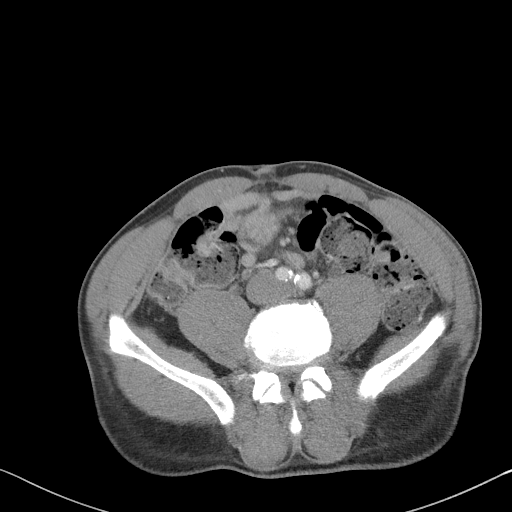
[im 52/94  soft-tissue]
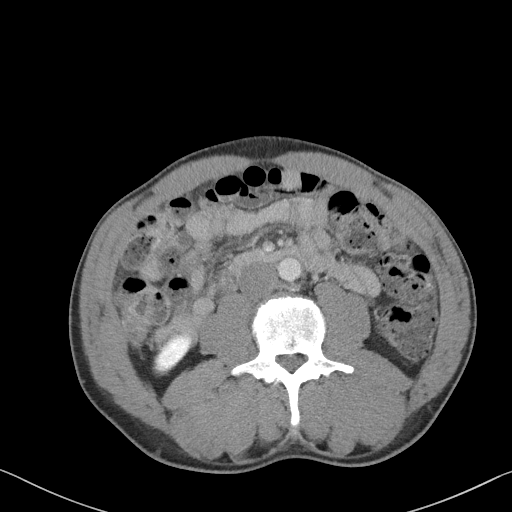
[im 57/94  soft-tissue]
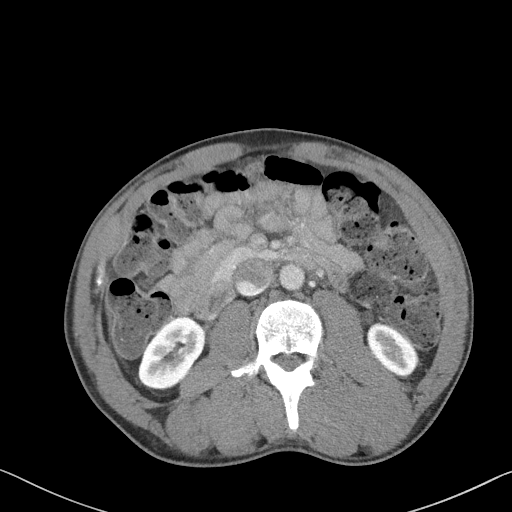
[im 68/94  soft-tissue]
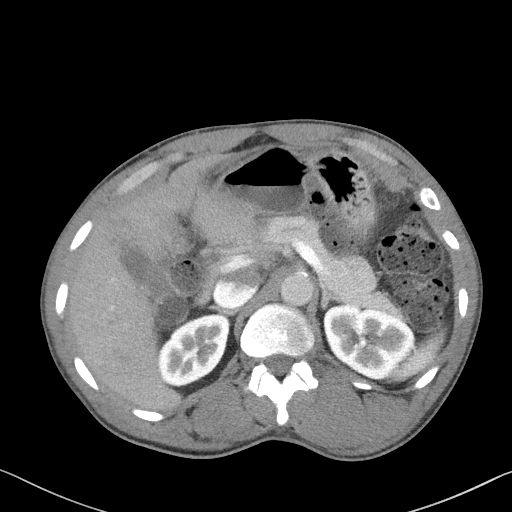
[im 68/94  bone]
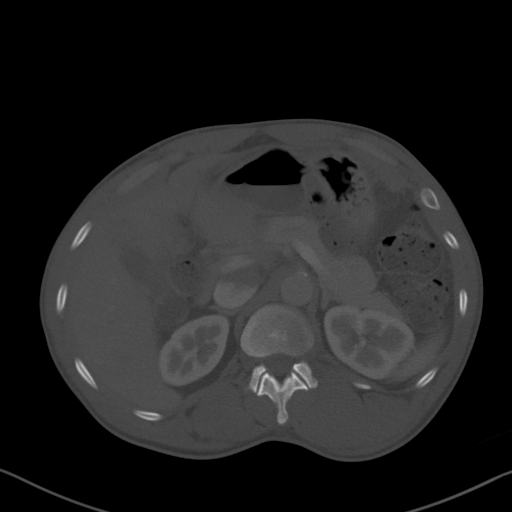
[im 73/94  soft-tissue]
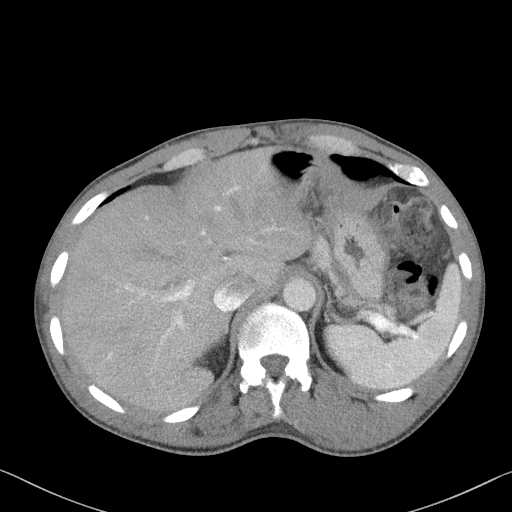
[im 78/94  soft-tissue]
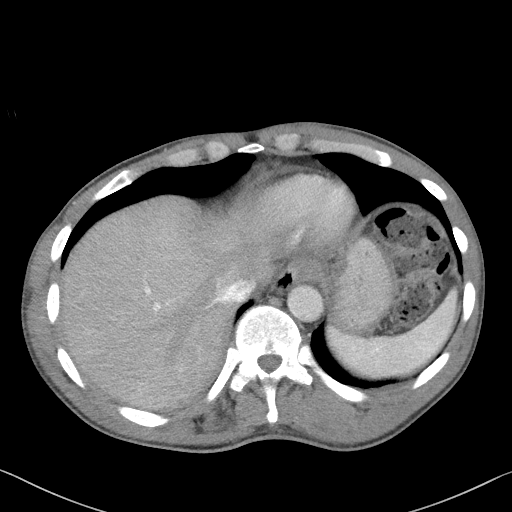
[im 88/94  soft-tissue]
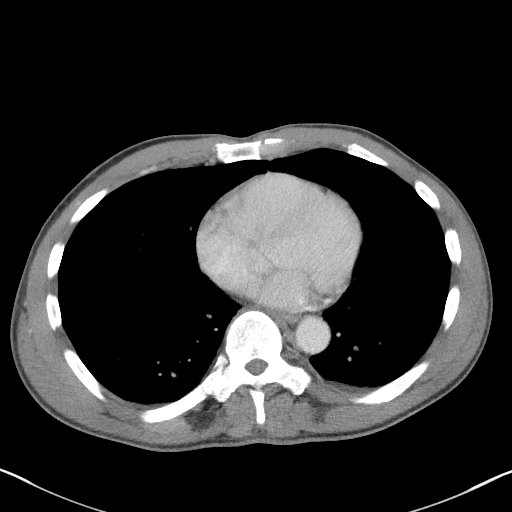

[Series 4: coronal st · coronal · 0.66mm/px · 3 of 89 slices shown]
[im 30/89  soft-tissue]
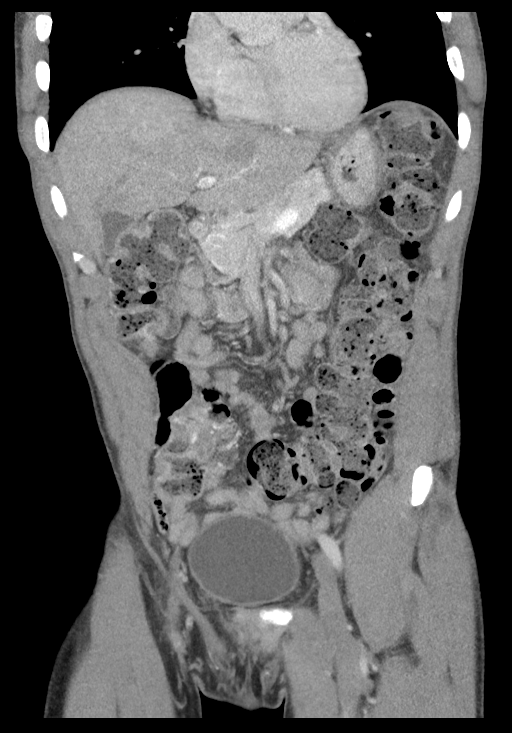
[im 40/89  soft-tissue]
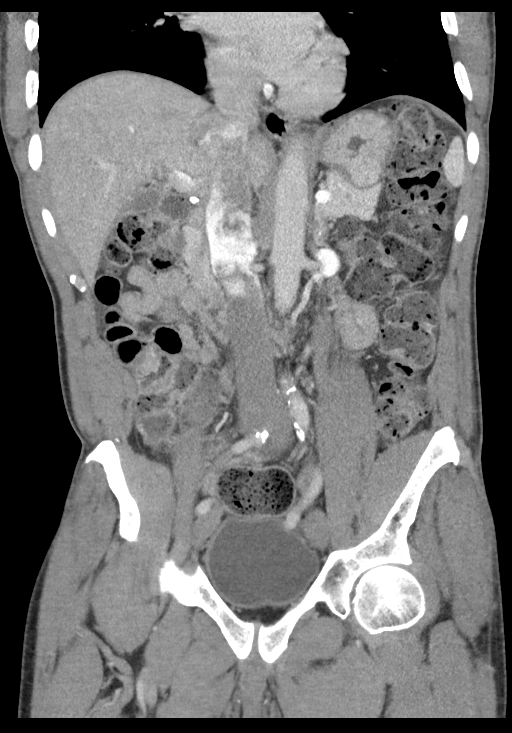
[im 49/89  soft-tissue]
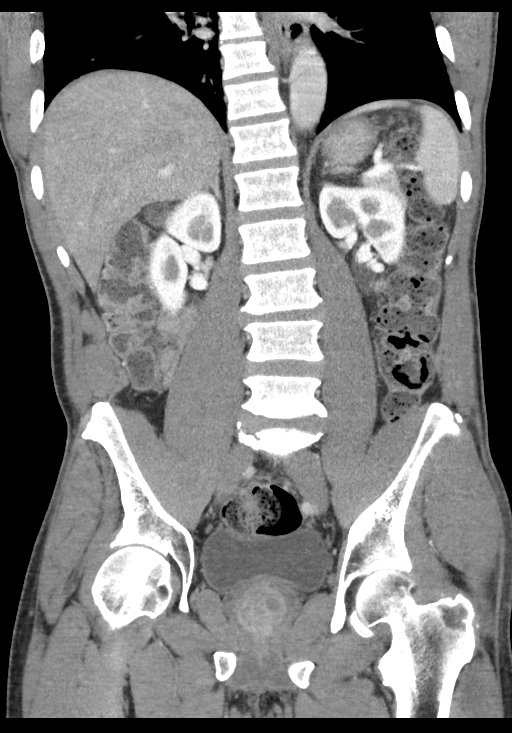

[15 of 46 positions shown; findings below may reference images not displayed]

FINDINGS: Lower chest: Clear lung bases. No significant pleural or pericardial
effusion.

Hepatobiliary: There are several enlarging low-density hepatic
lesions. There is large enough to characterize are consistent with
cysts, largest measuring 12 mm in the dome of the right lobe on
image 14. There are no worrisome hepatic findings. No evidence of
gallstones, gallbladder wall thickening or biliary dilatation.

Pancreas: Unremarkable. No pancreatic ductal dilatation or
surrounding inflammatory changes.

Spleen: Normal in size without focal abnormality.

Adrenals/Urinary Tract: Both adrenal glands appear normal. The
kidneys appear normal without evidence of urinary tract calculus,
suspicious lesion or hydronephrosis. No bladder abnormalities are
seen.

Stomach/Bowel: No evidence of bowel wall thickening, distention or
surrounding inflammatory change. The appendix appears normal. There
is moderate stool throughout the colon.

Vascular/Lymphatic: There are no enlarged abdominal or pelvic lymph
nodes. Mild aortoiliac atherosclerosis.

Reproductive: The prostate gland is mildly enlarged with central
heterogeneity, most likely due to benign prostatic hypertrophy. The
seminal vesicles appear unremarkable.

Other: There is a stable small supraumbilical hernia containing only
fat. No ascites.

Musculoskeletal: No acute or significant osseous findings. There is
progressive disc and endplate degeneration at L4-5 contributing to
spinal stenosis. Stable spurring of the iliac crests.
IMPRESSION: 1. No acute findings or explanation for the patient's symptoms.
2. Moderate stool throughout the colon consistent with constipation.
3. Incidental findings including enlarging hepatic cysts, mild
prostatomegaly, a stable small supraumbilical hernia containing only
fat and progressive lumbar spondylosis.
4.  Aortic Atherosclerosis ([LR]-[LR]).

## 2017-05-02 MED ORDER — IOPAMIDOL (ISOVUE-300) INJECTION 61%
100.0000 mL | Freq: Once | INTRAVENOUS | Status: AC | PRN
Start: 2017-05-02 — End: 2017-05-02
  Administered 2017-05-02: 100 mL via INTRAVENOUS

## 2017-05-02 MED ORDER — CIPROFLOXACIN HCL 500 MG PO TABS: 500.0000 mg | ORAL_TABLET | Freq: Two times a day (BID) | ORAL | 0 refills | Status: DC

## 2017-05-02 MED ORDER — MORPHINE SULFATE (PF) 2 MG/ML IV SOLN
2.0000 mg | Freq: Once | INTRAVENOUS | Status: AC
  Administered 2017-05-02: 2 mg via INTRAVENOUS
  Filled 2017-05-02: qty 1

## 2017-05-02 MED ORDER — SODIUM CHLORIDE 0.9 % IV BOLUS (SEPSIS)
500.0000 mL | Freq: Once | INTRAVENOUS | Status: AC
  Administered 2017-05-02: 500 mL via INTRAVENOUS

## 2017-05-02 NOTE — ED Notes (Signed)
From CT 

## 2017-05-02 NOTE — ED Notes (Signed)
guaic stool neg

## 2017-05-02 NOTE — ED Notes (Signed)
Last BM yesterday "it was soft"  Abd pain since miralax, milk of mag and prescription from Dr Luan Pulling

## 2017-05-02 NOTE — ED Triage Notes (Signed)
Pt states he has had trouble making a BM in several days. He has been using OTC medications with no relief. Pt was called in [place] and after that he made a small BM. NAD noted

## 2017-05-02 NOTE — ED Notes (Signed)
Dr Ray in to assess 

## 2017-05-02 NOTE — ED Provider Notes (Signed)
[facility] DEPT Provider Note   CSN: [id number] Arrival date & time: 05/02/17  2947  By signing my name below, I, Margit Banda, attest that this documentation has been prepared under the direction and in the presence of Pattricia Boss, MD. Electronically Signed: Margit Banda, ED Scribe. 05/02/17. 11:41 AM.  History   Chief Complaint Chief Complaint  Patient presents with  . Constipation    HPI Daniel Duffy is a 66 y.o. male with a PMHx of GERD and an enlarged prostate who presents to the Emergency Department complaining of moderate lower abdominal pain for the last few days. His appetite has decreased. He notes similar pain to a previous onset that was dx as constipation. His BM are "little pellets." He has been taking MiraLAX with no relief. Doesn't smoke or drink. Pt denies fever and chills.  The history is provided by the patient. No language interpreter was used.    Past Medical History:  Diagnosis Date  . Acid reflux   . Enlarged prostate     Patient Active Problem List   Diagnosis Date Noted  . Family history of colon cancer 03/23/2013  . Unspecified constipation 03/23/2013    Past Surgical History:  Procedure Laterality Date  . COLONOSCOPY  12/29/2005   [id number] hemorrhoids, anal papilla/Diminutive polyp on a stalk at 10 cm/otherwise normal rectum and colon, PATH: hyperplastic polyp  . COLONOSCOPY N/A 04/10/2013   Procedure: COLONOSCOPY;  Surgeon: Daneil Dolin, MD;  Location: [facility];  Service: Endoscopy;  Laterality: N/A;  2:00  . ESOPHAGOGASTRODUODENOSCOPY  12/29/2005   [id number] Schatzki's ring, small hiatal hernia, normal gastric mucosa, normal D1 and D2.   . HAND SURGERY     bilateral, got caught in a machine, two different occasions, right hand originally, than left hand. Left hand with skin graft  . SPINAL CORD DECOMPRESSION         Home Medications    Prior to Admission medications   Medication Sig Start Date End Date  Taking? Authorizing Provider  cyclobenzaprine (FLEXERIL) 10 MG tablet Take 1 tablet (10 mg total) by mouth 2 (two) times daily as needed for muscle spasms. 01/19/16   Ashley Murrain, NP  Linaclotide (LINZESS) 290 MCG CAPS Take 1 capsule by mouth daily. 30 minutes before breakfast. 03/23/13   Annitta Needs, NP  Multiple Vitamins-Minerals (MULTIVITAMINS THER. W/MINERALS) TABS Take 1 tablet by mouth daily.    [provider]  naproxen (NAPROSYN) 500 MG tablet Take 1 tablet (500 mg total) by mouth 2 (two) times daily. 01/19/16   Ashley Murrain, NP  omeprazole (PRILOSEC) 20 MG capsule Take 1 capsule (20 mg total) by mouth daily. 03/23/13   Annitta Needs, NP  polyethylene glycol [facility] / Floria Raveling) packet Take 17 g by mouth daily. 01/30/14   Elnora Morrison, MD  polyethylene glycol-electrolytes (TRILYTE) 420 G solution Take 4,000 mLs by mouth as directed. 03/23/13   Rourk, Cristopher Estimable, MD  Probiotic Product ([place]) CAPS Take 1 capsule by mouth daily as needed (Constipation).    [provider]  tamsulosin (FLOMAX) 0.4 MG CAPS Take 0.4 mg by mouth daily.    [provider]    Family History Family History  Problem Relation Age of Onset  . Colon cancer Brother        43s    Social History Social History  Substance Use Topics  . Smoking status: Never Smoker  . Smokeless tobacco: Never Used  . Alcohol use No  Allergies   Patient has no known allergies.   Review of Systems Review of Systems  Constitutional: Positive for appetite change. Negative for chills and fever.  Gastrointestinal: Positive for abdominal pain and constipation.  All other systems reviewed and are negative.    Physical Exam Updated Vital Signs BP 118/81 (BP Location: Right Arm)   Pulse 68   Temp 98 F (36.7 C) (Oral)   Resp 18   Ht 5\' 11"  (1.803 m)   Wt 167 lb (75.8 kg)   SpO2 100%   BMI 23.29 kg/m   Physical Exam  Constitutional: He is oriented to person, place, and  time. He appears well-developed and well-nourished.  HENT:  Head: Normocephalic and atraumatic.  Right Ear: External ear normal.  Left Ear: External ear normal.  Nose: Nose normal.  Mouth/Throat: Oropharynx is clear and moist.  Eyes: Conjunctivae and EOM are normal. Pupils are equal, round, and reactive to light.  Neck: Normal range of motion. Neck supple.  Cardiovascular: Normal rate, regular rhythm, normal heart sounds and intact distal pulses.   Pulmonary/Chest: Effort normal and breath sounds normal.  Abdominal: Soft. Bowel sounds are normal. There is tenderness.  Mild suprapubic tenderness palpation left lower quadrant tenderness palpation  Musculoskeletal: Normal range of motion.  Neurological: He is alert and oriented to person, place, and time. He has normal reflexes.  Skin: Skin is warm and dry.  Psychiatric: He has a normal mood and affect. His behavior is normal. Judgment and thought content normal.  Nursing note and vitals reviewed.    ED Treatments / Results  DIAGNOSTIC STUDIES: Oxygen Saturation is 97% on RA, normal by my interpretation.   COORDINATION OF CARE: 11:41 AM-Discussed next steps with pt which includes a CT scan. Pt verbalized understanding and is agreeable with the plan.    Labs (all labs ordered are listed, but only abnormal results are displayed) Labs Reviewed  CBC - Abnormal; Notable for the following:       Result Value   WBC 2.4 (*)    All other components within normal limits  COMPREHENSIVE METABOLIC PANEL  URINALYSIS, ROUTINE W REFLEX MICROSCOPIC    EKG  EKG Interpretation None       Radiology Ct Abdomen Pelvis W Contrast  Result Date: 05/02/2017 CLINICAL DATA:  Low abdominal pain for 1 week.  Constipation. EXAM: CT ABDOMEN AND PELVIS WITH CONTRAST TECHNIQUE: Multidetector CT imaging of the abdomen and pelvis was performed using the standard protocol following bolus administration of intravenous contrast. CONTRAST:  172mL ISOVUE-300  IOPAMIDOL (ISOVUE-300) INJECTION 61% COMPARISON:  Abdominopelvic CT 01/30/2014. FINDINGS: Lower chest: Clear lung bases. No significant pleural or pericardial effusion. Hepatobiliary: There are several enlarging low-density hepatic lesions. There is large enough to characterize are consistent with cysts, largest measuring 12 mm in the dome of the right lobe on image 14. There are no worrisome hepatic findings. No evidence of gallstones, gallbladder wall thickening or biliary dilatation. Pancreas: Unremarkable. No pancreatic ductal dilatation or surrounding inflammatory changes. Spleen: Normal in size without focal abnormality. Adrenals/Urinary Tract: Both adrenal glands appear normal. The kidneys appear normal without evidence of urinary tract calculus, suspicious lesion or hydronephrosis. No bladder abnormalities are seen. Stomach/Bowel: No evidence of bowel wall thickening, distention or surrounding inflammatory change. The appendix appears normal. There is moderate stool throughout the colon. Vascular/Lymphatic: There are no enlarged abdominal or pelvic lymph nodes. Mild aortoiliac atherosclerosis. Reproductive: The prostate gland is mildly enlarged with central heterogeneity, most likely due to benign prostatic hypertrophy. The  seminal vesicles appear unremarkable. Other: There is a stable small supraumbilical hernia containing only fat. No ascites. Musculoskeletal: No acute or significant osseous findings. There is progressive disc and endplate degeneration at L4-5 contributing to spinal stenosis. Stable spurring of the iliac crests. IMPRESSION: 1. No acute findings or explanation for the patient's symptoms. 2. Moderate stool throughout the colon consistent with constipation. 3. Incidental findings including enlarging hepatic cysts, mild prostatomegaly, a stable small supraumbilical hernia containing only fat and progressive lumbar spondylosis. 4.  Aortic Atherosclerosis (ICD10-I70.0). Electronically Signed    By: Richardean Sale M.D.   On: 05/02/2017 13:57    Procedures Procedures (including critical care time)  Medications Ordered in ED Medications - No data to display   Initial Impression / Assessment and Plan / ED Course  I have reviewed the triage vital signs and the nursing notes.  Pertinent labs & imaging results that were available during my care of the patient were reviewed by me and considered in my medical decision making (see chart for details).     No evidence of acute intra-abdominal abnormality. Prostate is mildly enlarged. This may represent prostatitis. Will place patient on Cipro and have follow-up with primary care. Urinalysis pending and culture.  Final Clinical Impressions(s) / ED Diagnoses   Final diagnoses:  Lower abdominal pain  Prostate enlargement    New Prescriptions New Prescriptions   No medications on file   I personally performed the services described in this documentation, which was scribed in my presence. The recorded information has been reviewed and considered.    Pattricia Boss, MD 05/02/17 [id number]

## 2017-05-02 NOTE — Discharge Instructions (Signed)
Please take antibiotic as prescribed.  Follow up with urologist.  You do not appear to be constipated on cat scan.

## 2017-05-02 NOTE — ED Notes (Signed)
Patient transported to CT 

## 2017-05-12 DIAGNOSIS — K5904 Chronic idiopathic constipation: Secondary | ICD-10-CM | POA: Diagnosis not present

## 2017-05-12 DIAGNOSIS — N138 Other obstructive and reflux uropathy: Secondary | ICD-10-CM | POA: Diagnosis not present

## 2017-05-12 DIAGNOSIS — N410 Acute prostatitis: Secondary | ICD-10-CM | POA: Diagnosis not present

## 2017-06-11 ENCOUNTER — Emergency Department (HOSPITAL_COMMUNITY): Payer: PPO

## 2017-06-11 ENCOUNTER — Emergency Department (HOSPITAL_COMMUNITY)
Admission: EM | Admit: 2017-06-11 | Discharge: 2017-06-11 | Disposition: A | Discharge status: Home / Self Care | Payer: PPO | Attending: Emergency Medicine | Admitting: Emergency Medicine

## 2017-06-11 ENCOUNTER — Encounter (HOSPITAL_COMMUNITY): Payer: Self-pay | Admitting: Emergency Medicine

## 2017-06-11 DIAGNOSIS — R945 Abnormal results of liver function studies: Secondary | ICD-10-CM | POA: Diagnosis not present

## 2017-06-11 DIAGNOSIS — K8020 Calculus of gallbladder without cholecystitis without obstruction: Secondary | ICD-10-CM | POA: Diagnosis not present

## 2017-06-11 DIAGNOSIS — R1010 Upper abdominal pain, unspecified: Secondary | ICD-10-CM | POA: Diagnosis not present

## 2017-06-11 DIAGNOSIS — R1013 Epigastric pain: Secondary | ICD-10-CM | POA: Diagnosis not present

## 2017-06-11 DIAGNOSIS — R7989 Other specified abnormal findings of blood chemistry: Secondary | ICD-10-CM | POA: Diagnosis not present

## 2017-06-11 DIAGNOSIS — R1011 Right upper quadrant pain: Secondary | ICD-10-CM | POA: Diagnosis not present

## 2017-06-11 DIAGNOSIS — R079 Chest pain, unspecified: Secondary | ICD-10-CM | POA: Diagnosis not present

## 2017-06-11 DIAGNOSIS — R748 Abnormal levels of other serum enzymes: Secondary | ICD-10-CM | POA: Diagnosis not present

## 2017-06-11 LAB — COMPREHENSIVE METABOLIC PANEL
ALBUMIN: 3.5 g/dL (ref 3.5–5.0)
ALT: 82 U/L — ABNORMAL HIGH (ref 17–63)
AST: 142 U/L — AB (ref 15–41)
Alkaline Phosphatase: 71 U/L (ref 38–126)
Anion gap: 7 (ref 5–15)
BUN: 10 mg/dL (ref 6–20)
CO2: 27 mmol/L (ref 22–32)
Calcium: 9.9 mg/dL (ref 8.9–10.3)
Chloride: 103 mmol/L (ref 101–111)
Creatinine, Ser: 1.14 mg/dL (ref 0.61–1.24)
GFR calc Af Amer: >60 mL/min (ref >60)
GFR calc non Af Amer: >60 mL/min (ref >60)
Glucose, Bld: 127 mg/dL — ABNORMAL HIGH (ref 65–99)
POTASSIUM: 4 mmol/L (ref 3.5–5.1)
SODIUM: 137 mmol/L (ref 135–145)
Total Bilirubin: 0.6 mg/dL (ref 0.3–1.2)
Total Protein: 6.8 g/dL (ref 6.5–8.1)

## 2017-06-11 LAB — TROPONIN I: Troponin I: <0.03 ng/mL (ref <0.03)

## 2017-06-11 LAB — CBC
HEMATOCRIT: 42.7 % (ref 39.0–52.0)
Hemoglobin: 14.1 g/dL (ref 13.0–17.0)
MCH: 27.9 pg (ref 26.0–34.0)
MCHC: 33 g/dL (ref 30.0–36.0)
MCV: 84.6 fL (ref 78.0–100.0)
Platelets: 157 10*3/uL (ref 150–400)
RBC: 5.05 MIL/uL (ref 4.22–5.81)
RDW: 14.3 % (ref 11.5–15.5)
WBC: 4.5 10*3/uL (ref 4.0–10.5)

## 2017-06-11 LAB — LIPASE, BLOOD: Lipase: 23 U/L (ref 11–51)

## 2017-06-11 IMAGING — DX DG CHEST 2V
2 series · 2 of 2 positions shown · non-contrast
Comparison: [DATE] .

CLINICAL DATA: Right-sided chest pain .

EXAM:
CHEST  2 VIEW

[chest lat]
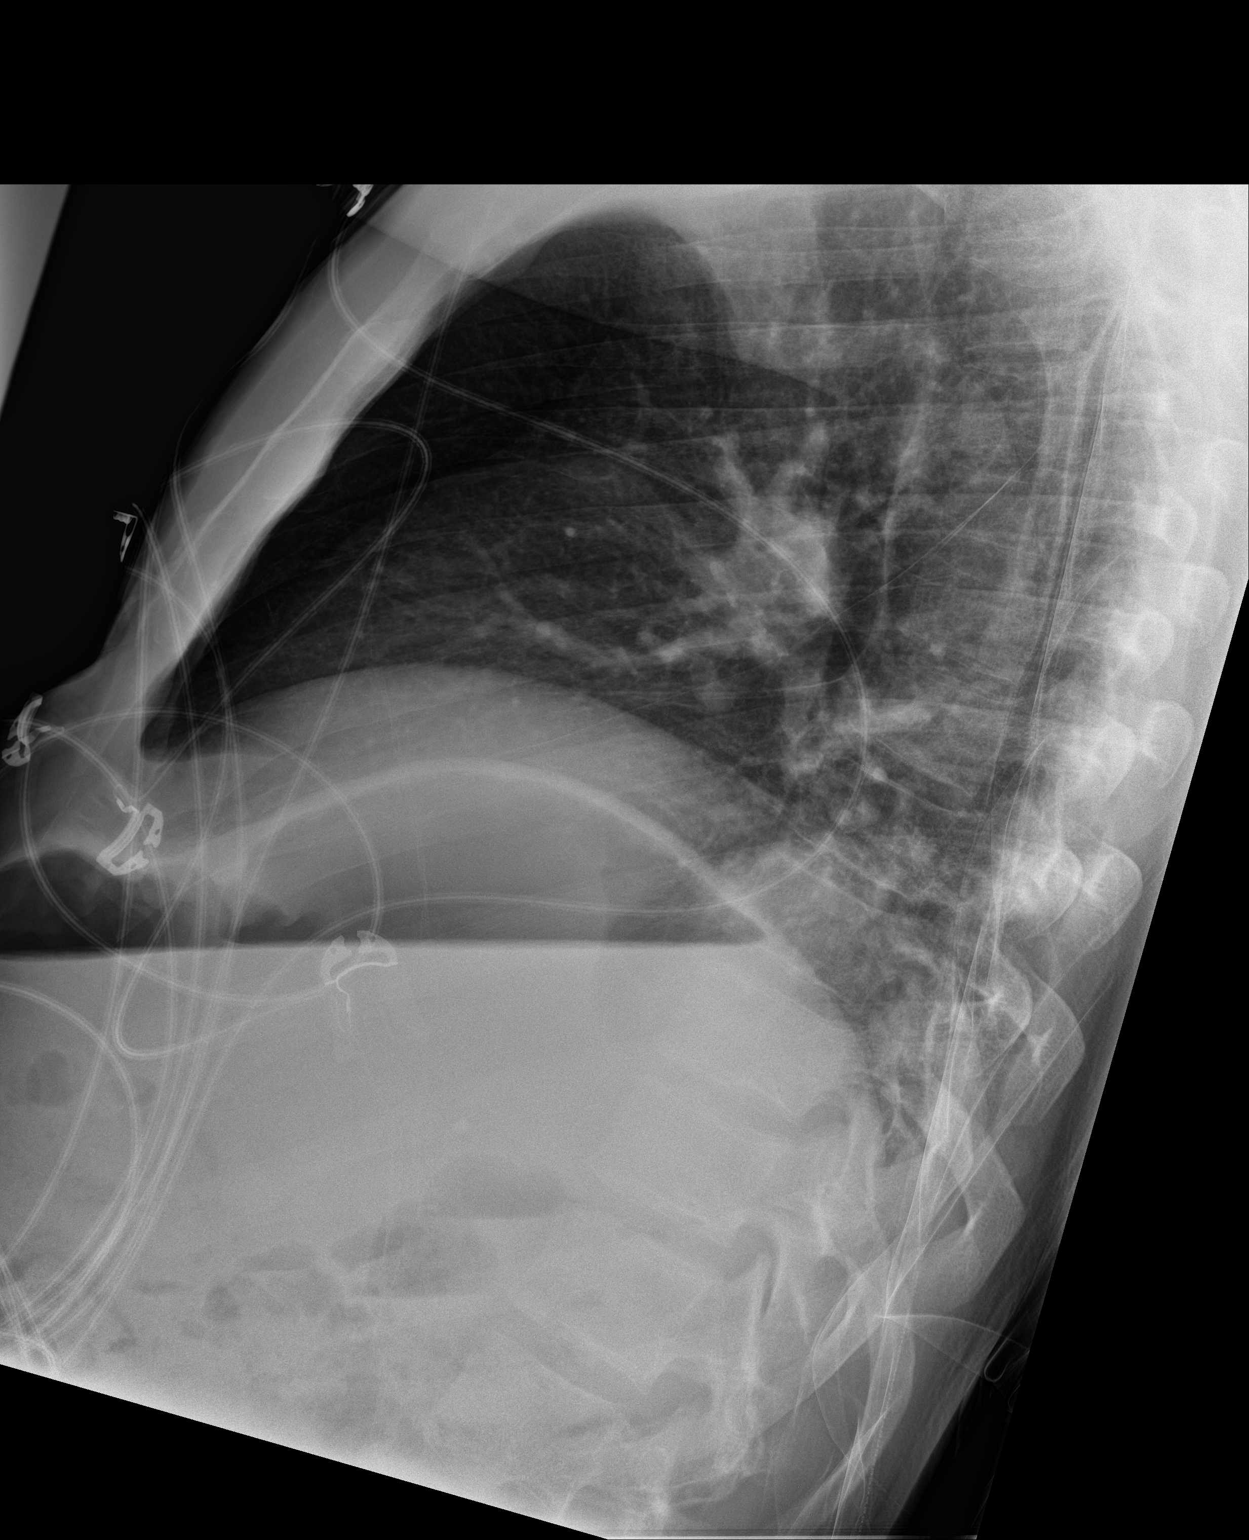

[chest ap]
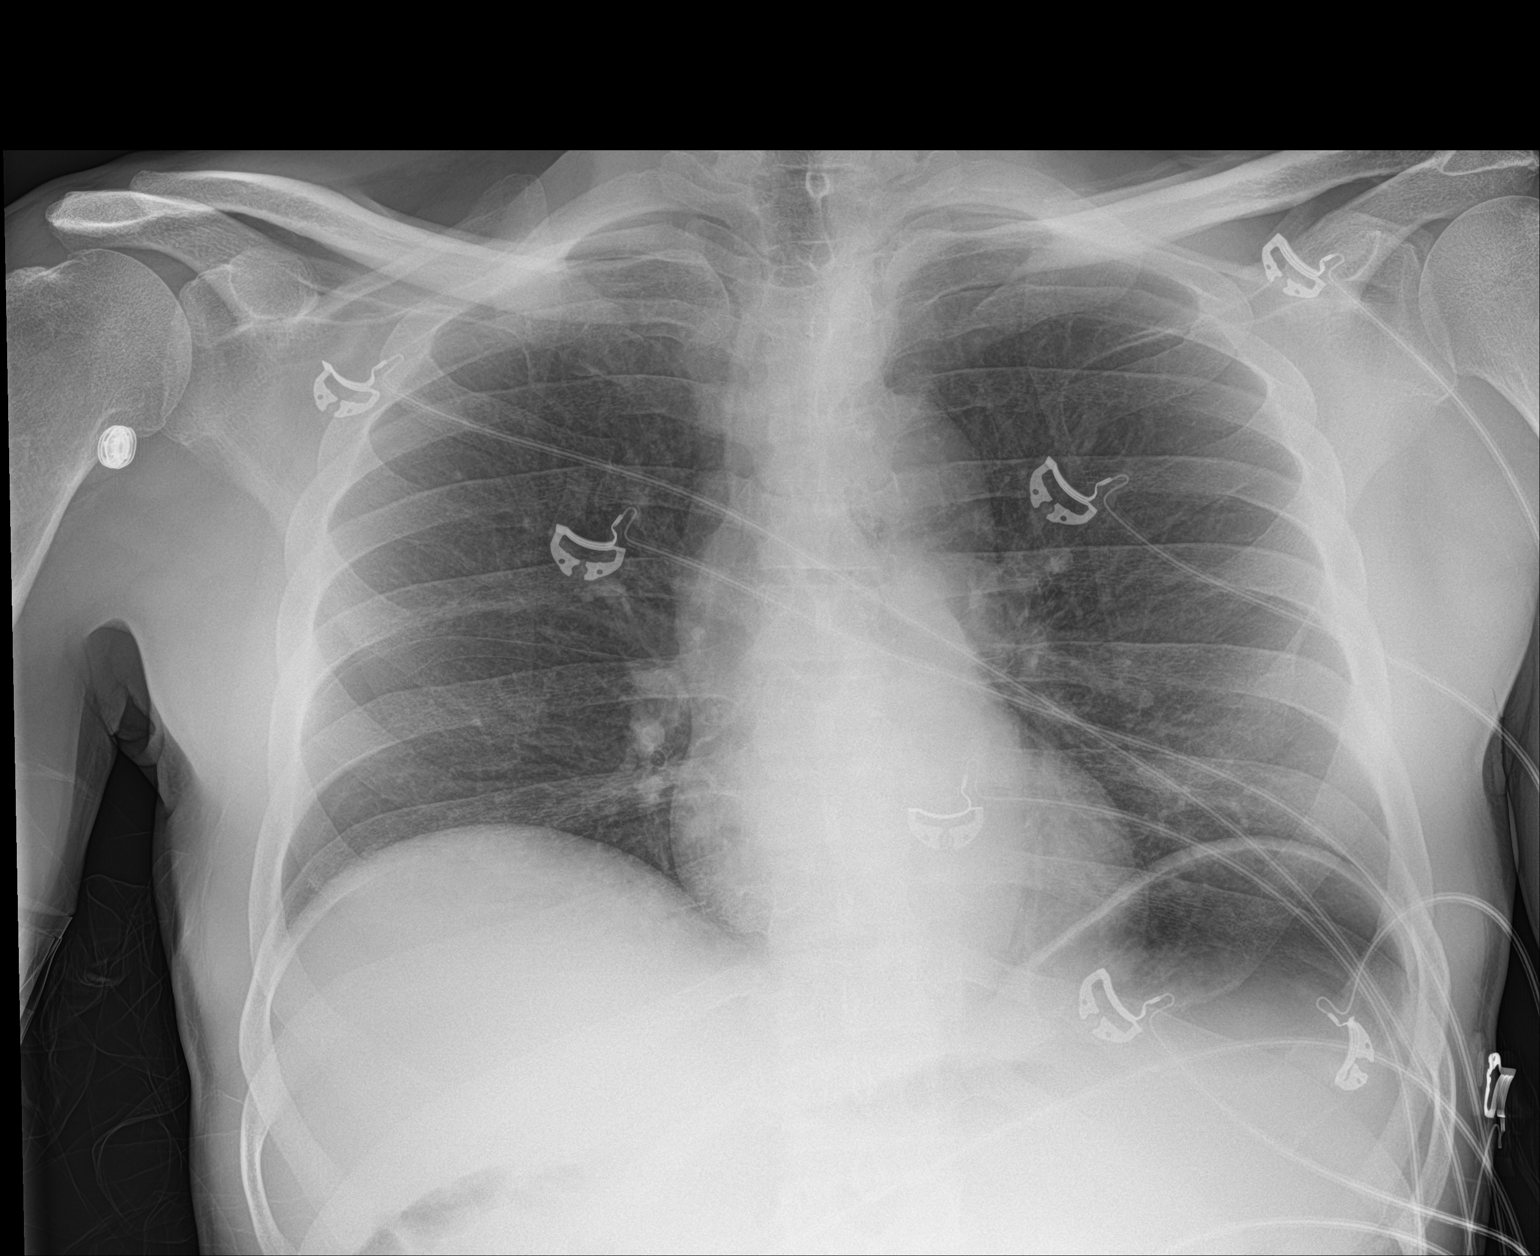

[2 of 2 positions shown; findings below may reference images not displayed]

FINDINGS: Mediastinum hilar structures normal. Low lung volumes with mild
basilar atelectasis. Heart size normal. No pleural effusion or
pneumothorax. Gastric distention. Prior cervical spine fusion .
IMPRESSION: 1. Gastric distention.

2. Low lung volumes with mild basilar atelectasis. No acute
cardiopulmonary disease.

## 2017-06-11 MED ORDER — ONDANSETRON HCL 4 MG PO TABS: 8.0000 mg | ORAL_TABLET | Freq: Three times a day (TID) | ORAL | 0 refills | Status: DC | PRN

## 2017-06-11 MED ORDER — HYDROMORPHONE HCL 1 MG/ML IJ SOLN
1.0000 mg | Freq: Once | INTRAMUSCULAR | Status: AC
  Administered 2017-06-11: 1 mg via INTRAVENOUS
  Filled 2017-06-11: qty 1

## 2017-06-11 MED ORDER — ONDANSETRON HCL 4 MG/2ML IJ SOLN
4.0000 mg | Freq: Once | INTRAMUSCULAR | Status: AC
  Administered 2017-06-11: 4 mg via INTRAVENOUS
  Filled 2017-06-11: qty 2

## 2017-06-11 MED ORDER — HYDROCODONE-ACETAMINOPHEN 5-325 MG PO TABS: 1.0000 | ORAL_TABLET | Freq: Four times a day (QID) | ORAL | 0 refills | Status: DC | PRN

## 2017-06-11 NOTE — ED Provider Notes (Signed)
[facility] DEPT Provider Note   CSN: [id number] Arrival date & time: 06/11/17  1044     History   Chief Complaint Chief Complaint  Patient presents with  . Chest Pain    HPI Daniel Duffy is a 66 y.o. male.  HPI Patient reports developing upper abdominal discomfort described as a pain and pressure in his upper abdomen and lower chest.  He reports generalized weakness.  He admits to nausea without vomiting.  No diarrhea.  He feels like his bowels have been moving normally.  He denies back pain or flank pain.  No anterior chest pain or shortness of breath.  Denies diaphoresis.  No prior history of cardiac disease.  He denies alcohol abuse.  Per the medical record he has had constipation before in the past.  No prior history of gastric ulcers but does report a history of acid reflux  Past Medical History:  Diagnosis Date  . Acid reflux   . Enlarged prostate     Patient Active Problem List   Diagnosis Date Noted  . Family history of colon cancer 03/23/2013  . Unspecified constipation 03/23/2013    Past Surgical History:  Procedure Laterality Date  . COLONOSCOPY  12/29/2005   UTM:[id number] hemorrhoids, anal papilla/Diminutive polyp on a stalk at 10 cm/otherwise normal rectum and colon, PATH: hyperplastic polyp  . COLONOSCOPY N/A 04/10/2013   Procedure: COLONOSCOPY;  Surgeon: Daneil Dolin, MD;  Location: [facility];  Service: Endoscopy;  Laterality: N/A;  2:00  . ESOPHAGOGASTRODUODENOSCOPY  12/29/2005   SFK:[id number] Schatzki's ring, small hiatal hernia, normal gastric mucosa, normal D1 and D2.   . HAND SURGERY     bilateral, got caught in a machine, two different occasions, right hand originally, than left hand. Left hand with skin graft  . SPINAL CORD DECOMPRESSION         Home Medications    Prior to Admission medications   Medication Sig Start Date End Date Taking? Authorizing Provider  fluticasone (CUTIVATE) 0.05 % cream Apply 1 application topically 2  (two) times daily as needed for rash. Apply to face. 03/11/17  Yes [provider]  linaclotide (LINZESS) 145 MCG CAPS capsule Take 145 mcg by mouth daily before breakfast.   Yes [provider]  Multiple Vitamins-Minerals (MULTIVITAMINS THER. W/MINERALS) TABS Take 1 tablet by mouth daily.   Yes [provider]  omeprazole (PRILOSEC) 20 MG capsule Take 1 capsule (20 mg total) by mouth daily. 03/23/13  Yes Annitta Needs, NP  tamsulosin (FLOMAX) 0.4 MG CAPS capsule Take 0.4 mg by mouth daily after breakfast.   Yes [provider]  HYDROcodone-acetaminophen (NORCO/VICODIN) 5-325 MG tablet Take 1 tablet by mouth every 6 (six) hours as needed for moderate pain. 06/11/17   Jola Schmidt, MD  ondansetron (ZOFRAN) 4 MG tablet Take 2 tablets (8 mg total) by mouth every 8 (eight) hours as needed for nausea or vomiting. 06/11/17   Jola Schmidt, MD    Family History Family History  Problem Relation Age of Onset  . Colon cancer Brother        66s    Social History Social History  Substance Use Topics  . Smoking status: Never Smoker  . Smokeless tobacco: Never Used  . Alcohol use No     Allergies   Patient has no known allergies.   Review of Systems Review of Systems  All other systems reviewed and are negative.    Physical Exam Updated Vital Signs BP 128/80 (BP Location: Right  Arm)   Pulse 72   Temp (!) 97.5 F (36.4 C) (Oral)   Resp 19   Ht 5\' 11"  (1.803 m)   Wt 74.8 kg (165 lb)   SpO2 96%   BMI 23.01 kg/m   Physical Exam  Constitutional: He is oriented to person, place, and time. He appears well-developed and well-nourished.  HENT:  Head: Normocephalic and atraumatic.  Eyes: EOM are normal.  Neck: Normal range of motion.  Cardiovascular: Normal rate, regular rhythm and normal heart sounds.   Pulmonary/Chest: Effort normal and breath sounds normal. No respiratory distress.  Abdominal: Soft. He exhibits no distension.  Mild epigastric and  right upper quadrant tenderness without guarding or rebound  Musculoskeletal: Normal range of motion.  Neurological: He is alert and oriented to person, place, and time.  Skin: Skin is warm and dry.  Psychiatric: He has a normal mood and affect. Judgment normal.  Nursing note and vitals reviewed.    ED Treatments / Results  Labs (all labs ordered are listed, but only abnormal results are displayed) Labs Reviewed  COMPREHENSIVE METABOLIC PANEL - Abnormal; Notable for the following:       Result Value   Glucose, Bld 127 (*)    AST 142 (*)    ALT 82 (*)    All other components within normal limits  CBC  TROPONIN I  LIPASE, BLOOD  URINALYSIS, ROUTINE W REFLEX MICROSCOPIC    EKG  EKG Interpretation  Date/Time:  Friday June 11 2017 10:53:59 EDT Ventricular Rate:  73 PR Interval:    QRS Duration: 89 QT Interval:  375 QTC Calculation: 414 R Axis:   83 Text Interpretation:  Sinus rhythm Consider left atrial enlargement Borderline right axis deviation ST elevation suggests acute pericarditis No old tracing to compare Confirmed by Jola Schmidt [id number]) on 06/11/2017 11:14:37 AM       Radiology Dg Chest 2 View  Result Date: 06/11/2017 CLINICAL DATA:  Right-sided chest pain . EXAM: CHEST  2 VIEW COMPARISON:  01/04/2017 . FINDINGS: Mediastinum hilar structures normal. Low lung volumes with mild basilar atelectasis. Heart size normal. No pleural effusion or pneumothorax. Gastric distention. Prior cervical spine fusion . IMPRESSION: 1. Gastric distention. 2. Low lung volumes with mild basilar atelectasis. No acute cardiopulmonary disease. Electronically Signed   By: Marcello Moores  Register   On: 06/11/2017 12:24   US Abdomen Limited Ruq  Result Date: 06/11/2017 CLINICAL DATA:  Sudden onset upper abdomen pain EXAM: ULTRASOUND ABDOMEN LIMITED RIGHT UPPER QUADRANT COMPARISON:  None. FINDINGS: Gallbladder: The gallbladder is partially contracted. No gallstones or wall thickening visualized. No  sonographic Murphy sign noted by sonographer. Common bile duct: Diameter: 5 mm Liver: No focal lesion identified. Within normal limits in parenchymal echogenicity. IMPRESSION: Contracted gallbladder. No sonographic Murphy sign. No acute abnormality noted. Electronically Signed   By: Abelardo Diesel M.D.   On: 06/11/2017 12:20    Procedures Procedures (including critical care time)  Medications Ordered in ED Medications  HYDROmorphone (DILAUDID) injection 1 mg (1 mg Intravenous Given 06/11/17 1156)  ondansetron (ZOFRAN) injection 4 mg (4 mg Intravenous Given 06/11/17 1156)     Initial Impression / Assessment and Plan / ED Course  I have reviewed the triage vital signs and the nursing notes.  Pertinent labs & imaging results that were available during my care of the patient were reviewed by me and considered in my medical decision making (see chart for details).    1:46 PM Patient feels much better this time.  Mild  elevation in his LFTs.  No elevated lipase.  Ultrasound demonstrates no significant abnormalities.  Vital signs are stable.  Patient feels better at this time.  Doubt atypical presentation of ACS.  I do not think he needs CT imaging of his abdomen at this time.  Close primary care and GI follow-up.  If the symptoms persist she may benefit from endoscopy.  Could represent gastritis/duodenitis  Final Clinical Impressions(s) / ED Diagnoses   Final diagnoses:  Upper abdominal pain  Elevated lipase    New Prescriptions New Prescriptions   HYDROCODONE-ACETAMINOPHEN (NORCO/VICODIN) 5-325 MG TABLET    Take 1 tablet by mouth every 6 (six) hours as needed for moderate pain.   ONDANSETRON (ZOFRAN) 4 MG TABLET    Take 2 tablets (8 mg total) by mouth every 8 (eight) hours as needed for nausea or vomiting.     Jola Schmidt, MD 06/11/17 [id number]

## 2017-06-11 NOTE — ED Triage Notes (Signed)
Onset the morning after waking up. Laying on bed sudden pressure in chest and weakness

## 2017-06-15 ENCOUNTER — Encounter: Payer: Self-pay | Admitting: Internal Medicine

## 2017-06-15 ENCOUNTER — Telehealth: Payer: Self-pay | Admitting: Gastroenterology

## 2017-06-15 NOTE — Telephone Encounter (Signed)
He is an established patient, seen in 2014. May schedule.

## 2017-06-15 NOTE — Telephone Encounter (Signed)
OV made and letter mailed °

## 2017-06-15 NOTE — Telephone Encounter (Signed)
Pt seen in the ER and was told to follow up with [place] ASAP. Please advise if we can accept him as a new patient.

## 2017-08-10 ENCOUNTER — Encounter: Payer: Self-pay | Admitting: Gastroenterology

## 2017-08-10 ENCOUNTER — Other Ambulatory Visit: Payer: Self-pay | Admitting: Gastroenterology

## 2017-08-10 ENCOUNTER — Other Ambulatory Visit: Payer: Self-pay

## 2017-08-10 ENCOUNTER — Ambulatory Visit (INDEPENDENT_AMBULATORY_CARE_PROVIDER_SITE_OTHER): Payer: PPO | Admitting: Gastroenterology

## 2017-08-10 VITALS — BP 122/76 | HR 75 | Temp 98.3°F | Ht 67.0 in | Wt 158.9 lb

## 2017-08-10 DIAGNOSIS — R634 Abnormal weight loss: Secondary | ICD-10-CM | POA: Diagnosis not present

## 2017-08-10 DIAGNOSIS — K5900 Constipation, unspecified: Secondary | ICD-10-CM

## 2017-08-10 DIAGNOSIS — R1013 Epigastric pain: Secondary | ICD-10-CM

## 2017-08-10 DIAGNOSIS — R7989 Other specified abnormal findings of blood chemistry: Secondary | ICD-10-CM

## 2017-08-10 DIAGNOSIS — R945 Abnormal results of liver function studies: Secondary | ICD-10-CM

## 2017-08-10 LAB — COMPLETE METABOLIC PANEL WITH GFR
ALBUMIN: 3.8 g/dL (ref 3.6–5.1)
ALK PHOS: 62 U/L (ref 40–115)
ALT: 35 U/L (ref 9–46)
AST: 47 U/L — ABNORMAL HIGH (ref 10–35)
BILIRUBIN TOTAL: 0.6 mg/dL (ref 0.2–1.2)
BUN: 11 mg/dL (ref 7–25)
CALCIUM: 9.8 mg/dL (ref 8.6–10.3)
CO2: 24 mmol/L (ref 20–32)
Chloride: 109 mmol/L (ref 98–110)
Creat: 0.97 mg/dL (ref 0.70–1.25)
GFR, Est Non African American: 82 mL/min (ref >=60)
Glucose, Bld: 102 mg/dL — ABNORMAL HIGH (ref 65–99)
Potassium: 4 mmol/L (ref 3.5–5.3)
Sodium: 140 mmol/L (ref 135–146)
TOTAL PROTEIN: 6.4 g/dL (ref 6.1–8.1)

## 2017-08-10 NOTE — Assessment & Plan Note (Signed)
66 year old gentleman with recent ED visit for acute onset epigastric pain/right-sided chest pain was noted to have new onset elevated AST/ALT. Right upper quadrant ultrasound with contracted gallbladder but no obvious gallstones or Murphy sign. Chest x-ray with gastric distention. CT abdomen pelvis with contrast done back in May with hepatic cyst, constipation, pancreas appear normal. Clinically patient feels better. He has had an unexplained 10 pound weight loss since May. His appetite is good. His constipation is well managed with Linzess, but he relies on samples. His abdominal pain has resolved.  Initially we'll repeat LFTs. Further recommendations to follow regarding if any further imaging will be needed. Provided patient with more Linzess samples. If he runs out he should take MiraLAX 17 g daily every day until he has regular bowel movements and then take daily on days he does not have adequate bowel movement. Patient voiced understanding.

## 2017-08-10 NOTE — Progress Notes (Signed)
CC'ED TO PCP 

## 2017-08-10 NOTE — Patient Instructions (Signed)
1. Please have your labs done. We will determine if CT scan is needed of your abdomen after your labs.  2. Continue Linzess 171mcg daily on empty stomach for constipation.  3. Return to the office in 6 weeks for follow up.

## 2017-08-10 NOTE — Progress Notes (Signed)
Primary Care Physician:  Sinda Du, MD  Primary Gastroenterologist:  Garfield Cornea, MD   Chief Complaint  Patient presents with  . Abdominal Pain    HPI:  Daniel Duffy is a 66 y.o. male here For further evaluation of abdominal pain. He was last seen in 2014 constipation. Also with history of GERD. Brother diagnosed in his 26s with colon cancer.   Patient seen in the ED back in July with epigastric pain. Noted to have new elevation of AST/ALT. Right upper quadrant ultrasound showed contracted gallbladder but no evidence of stones, no sonographic Murphy sign. Chest x-ray showed gastric distention. Patient was seen in the ED back in May as well as lower abdominal pain at that time. Complain of constipation. Suspected prostatitis at the time. Also head CT and pelvis with contrast with several enlarging low-density hepatic lesions, probable cyst. Pancreas unremarkable. Moderate stool throughout the colon. Prostate gland mildly enlarged, stable small supraumbilical hernia containing only fat.  Weight is down 10 pounds since May. Several months of abdominal pain. Has had constipation. Has been using Linzess 161mcg daily, samples. Cannot afford prescription. Has chronically been on omeprazole. No heartburn lately. Occasional nausea but vomiting. No dysphagia. No abdominal pain in few weeks. No explanation for weight loss. No melena, brbpr. Never daily bowel movement but constipation worse. Previously tried MiraLAX but not consistently.    Current Outpatient Prescriptions  Medication Sig Dispense Refill  . docusate sodium (COLACE) 100 MG capsule Take 100 mg by mouth 2 (two) times daily.    Marland Kitchen linaclotide (LINZESS) 145 MCG CAPS capsule Take 145 mcg by mouth daily before breakfast.    . Multiple Vitamins-Minerals (MULTIVITAMINS THER. W/MINERALS) TABS Take 1 tablet by mouth daily.    Marland Kitchen omeprazole (PRILOSEC) 20 MG capsule Take 1 capsule (20 mg total) by mouth daily. 30 capsule 11  . tamsulosin  (FLOMAX) 0.4 MG CAPS capsule Take 0.4 mg by mouth daily after breakfast.     No current facility-administered medications for this visit.     Allergies as of 08/10/2017  . (No Known Allergies)    Past Medical History:  Diagnosis Date  . Acid reflux   . Enlarged prostate     Past Surgical History:  Procedure Laterality Date  . COLONOSCOPY  12/29/2005   VEH:[id number] hemorrhoids, anal papilla/Diminutive polyp on a stalk at 10 cm/otherwise normal rectum and colon, PATH: hyperplastic polyp  . COLONOSCOPY N/A 04/10/2013   RMR: tubular adenoma: next TCS 04/2018  . ESOPHAGOGASTRODUODENOSCOPY  12/29/2005   GEZ:[id number] Schatzki's ring, small hiatal hernia, normal gastric mucosa, normal D1 and D2.   . HAND SURGERY     bilateral, got caught in a machine, two different occasions, right hand originally, than left hand. Left hand with skin graft  . SPINAL CORD DECOMPRESSION      Family History  Problem Relation Age of Onset  . Colon cancer Brother        24s    Social History   Social History  . Marital status: Divorced    Spouse name: N/A  . Number of children: N/A  . Years of education: N/A   Occupational History  . Not on file.   Social History Main Topics  . Smoking status: Never Smoker  . Smokeless tobacco: Never Used  . Alcohol use No  . Drug use: No  . Sexual activity: No   Other Topics Concern  . Not on file   Social History Narrative  . No narrative on file  ROS:  General: Negative for anorexia,fever, chills, fatigue, weakness.See history of present illness Eyes: Negative for vision changes.  ENT: Negative for hoarseness, difficulty swallowing , nasal congestion. CV: Negative for chest pain, angina, palpitations, dyspnea on exertion, peripheral edema.  Respiratory: Negative for dyspnea at rest, dyspnea on exertion, cough, sputum, wheezing.  GI: See history of present illness. GU:  Negative for dysuria, hematuria, urinary incontinence, urinary  frequency, nocturnal urination.  MS: Negative for joint pain, low back pain.  Derm: Negative for rash or itching.  Neuro: Negative for weakness, abnormal sensation, seizure, frequent headaches, memory loss, confusion.  Psych: Negative for anxiety, depression, suicidal ideation, hallucinations.  Endo: See history of present illness Heme: Negative for bruising or bleeding. Allergy: Negative for rash or hives.    Physical Examination:  BP 122/76   Pulse 75   Temp 98.3 F (36.8 C) (Oral)   Ht 5\' 7"  (1.702 m)   Wt 158 lb 14.4 oz (72.1 kg)   BMI 24.89 kg/m    General: Well-nourished, well-developed in no acute distress.  Head: Normocephalic, atraumatic.   Eyes: Conjunctiva pink, no icterus. Mouth: Oropharyngeal mucosa moist and pink , no lesions erythema or exudate. Neck: Supple without thyromegaly, masses, or lymphadenopathy.  Lungs: Clear to auscultation bilaterally.  Heart: Regular rate and rhythm, no murmurs rubs or gallops.  Abdomen: Bowel sounds are normal, nontender, nondistended, no hepatosplenomegaly or masses, no abdominal bruits, no rebound or guarding.  Small supraumbilical hernia easily reducible and nontender Rectal: Not performed Extremities: No lower extremity edema. No clubbing or deformities.  Neuro: Alert and oriented x 4 , grossly normal neurologically.  Skin: Warm and dry, no rash or jaundice.   Psych: Alert and cooperative, normal mood and affect.  Labs: Lab Results  Component Value Date   LIPASE 23 06/11/2017   Lab Results  Component Value Date   CREATININE 1.14 06/11/2017   BUN 10 06/11/2017   NA 137 06/11/2017   K 4.0 06/11/2017   CL 103 06/11/2017   CO2 27 06/11/2017   Lab Results  Component Value Date   ALT 82 (H) 06/11/2017   AST 142 (H) 06/11/2017   ALKPHOS 71 06/11/2017   BILITOT 0.6 06/11/2017   Lab Results  Component Value Date   WBC 4.5 06/11/2017   HGB 14.1 06/11/2017   HCT 42.7 06/11/2017   MCV 84.6 06/11/2017   PLT 157  06/11/2017   Labs from 05/02/2017, total bilirubin 0.7, alkaline phosphatase 61, AST 27, ALT 21.  Imaging Studies: CLINICAL DATA:  Right-sided chest pain .  EXAM: CHEST  2 VIEW  COMPARISON:  01/04/2017 .  FINDINGS: Mediastinum hilar structures normal. Low lung volumes with mild basilar atelectasis. Heart size normal. No pleural effusion or pneumothorax. Gastric distention. Prior cervical spine fusion .  IMPRESSION: 1. Gastric distention.  2. Low lung volumes with mild basilar atelectasis. No acute cardiopulmonary disease.   Electronically Signed   By: Marcello Moores  Register   On: 06/11/2017 12:24    CLINICAL DATA:  Sudden onset upper abdomen pain  EXAM: ULTRASOUND ABDOMEN LIMITED RIGHT UPPER QUADRANT  COMPARISON:  None.  FINDINGS: Gallbladder:  The gallbladder is partially contracted. No gallstones or wall thickening visualized. No sonographic Murphy sign noted by sonographer.  Common bile duct:  Diameter: 5 mm  Liver:  No focal lesion identified. Within normal limits in parenchymal echogenicity.  IMPRESSION: Contracted gallbladder. No sonographic Murphy sign. No acute abnormality noted.   Electronically Signed   By: Mallie Darting.D.  On: 06/11/2017 12:20

## 2017-08-13 ENCOUNTER — Encounter: Payer: Self-pay | Admitting: Gastroenterology

## 2017-08-15 NOTE — Progress Notes (Signed)
Please let patient know his LFTs are improved but ast still slightly elevated.  Would recommend additional labs, Hep B surface antigen, HCV Ab, iron/TIBC, ferritin Is he having any abdominal pain? Keep ov as planned.

## 2017-08-16 NOTE — Progress Notes (Signed)
Vm not set up. Mailing a letter to call.

## 2017-08-17 ENCOUNTER — Other Ambulatory Visit: Payer: Self-pay

## 2017-08-17 DIAGNOSIS — R945 Abnormal results of liver function studies: Secondary | ICD-10-CM

## 2017-08-17 DIAGNOSIS — R7989 Other specified abnormal findings of blood chemistry: Secondary | ICD-10-CM

## 2017-08-17 NOTE — Progress Notes (Signed)
Pt is aware of results and plan to do the additional labs. He is not having any abdominal pain now.  He is aware of his OV appt and will keep that. His labs I printed for [facility], except the [facility] would not default to [facility] and I could not add [facility]. I put it in for [employer] and faxed to [facility].  I am letting Almyra Free know so she can contact the representative.

## 2017-08-18 DIAGNOSIS — R945 Abnormal results of liver function studies: Secondary | ICD-10-CM | POA: Diagnosis not present

## 2017-08-19 LAB — HEPATITIS C ANTIBODY
HEP C AB: NONREACTIVE
SIGNAL TO CUT-OFF: 0.15 (ref <1.00)

## 2017-08-19 LAB — IRON, TOTAL/TOTAL IRON BINDING CAP
%SAT: 21 % (calc) (ref 15–60)
Iron: 58 ug/dL (ref 50–180)
TIBC: 274 mcg/dL (calc) (ref 250–425)

## 2017-08-19 LAB — FERRITIN: Ferritin: 275 ng/mL (ref 20–380)

## 2017-08-19 LAB — HEPATITIS B SURFACE ANTIGEN: Hepatitis B Surface Ag: NONREACTIVE

## 2017-08-23 NOTE — Progress Notes (Signed)
His labs are negative for iron overload, hep b and c.  His weight loss is worrisome. Down 10 pounds since 06/2017.   Is he having any further upper abdominal pain, loss of appetite, nausea or vomiting? Is his bowels moving better?

## 2017-08-24 NOTE — Progress Notes (Signed)
OK. Let's have him keep f/u appt with me next month for weight loss. Call with any problems in the interim.

## 2017-09-06 DIAGNOSIS — Z23 Encounter for immunization: Secondary | ICD-10-CM | POA: Diagnosis not present

## 2017-09-14 DIAGNOSIS — K5904 Chronic idiopathic constipation: Secondary | ICD-10-CM | POA: Diagnosis not present

## 2017-09-14 DIAGNOSIS — N138 Other obstructive and reflux uropathy: Secondary | ICD-10-CM | POA: Diagnosis not present

## 2017-09-14 DIAGNOSIS — E785 Hyperlipidemia, unspecified: Secondary | ICD-10-CM | POA: Diagnosis not present

## 2017-09-14 DIAGNOSIS — R634 Abnormal weight loss: Secondary | ICD-10-CM | POA: Diagnosis not present

## 2017-09-15 ENCOUNTER — Encounter: Payer: Self-pay | Admitting: Family Medicine

## 2017-09-15 DIAGNOSIS — R634 Abnormal weight loss: Secondary | ICD-10-CM | POA: Diagnosis not present

## 2017-09-15 DIAGNOSIS — E875 Hyperkalemia: Secondary | ICD-10-CM | POA: Diagnosis not present

## 2017-09-15 DIAGNOSIS — K5904 Chronic idiopathic constipation: Secondary | ICD-10-CM | POA: Diagnosis not present

## 2017-09-15 DIAGNOSIS — N138 Other obstructive and reflux uropathy: Secondary | ICD-10-CM | POA: Diagnosis not present

## 2017-09-24 ENCOUNTER — Ambulatory Visit: Payer: PPO | Admitting: Gastroenterology

## 2017-09-29 ENCOUNTER — Ambulatory Visit (INDEPENDENT_AMBULATORY_CARE_PROVIDER_SITE_OTHER): Payer: PPO | Admitting: Gastroenterology

## 2017-09-29 ENCOUNTER — Other Ambulatory Visit: Payer: Self-pay

## 2017-09-29 ENCOUNTER — Encounter: Payer: Self-pay | Admitting: Gastroenterology

## 2017-09-29 VITALS — BP 118/80 | HR 79 | Temp 98.0°F | Ht 71.5 in | Wt 161.0 lb

## 2017-09-29 DIAGNOSIS — R634 Abnormal weight loss: Secondary | ICD-10-CM | POA: Diagnosis not present

## 2017-09-29 DIAGNOSIS — R7989 Other specified abnormal findings of blood chemistry: Secondary | ICD-10-CM

## 2017-09-29 DIAGNOSIS — R945 Abnormal results of liver function studies: Secondary | ICD-10-CM | POA: Diagnosis not present

## 2017-09-29 DIAGNOSIS — K5900 Constipation, unspecified: Secondary | ICD-10-CM

## 2017-09-29 MED ORDER — LINACLOTIDE 145 MCG PO CAPS: 145.0000 ug | ORAL_CAPSULE | Freq: Every day | ORAL | 5 refills | Status: DC

## 2017-09-29 NOTE — Patient Instructions (Signed)
1. Please come and get weighted in 4-6 weeks. 2. Please have your labs done in 2 months, we will remind you when time. 3. Continue Linzess once daily for constipation. Hold for diarrhea. Prescription sent to your pharmacy.  4. Return to the office in 4 months.

## 2017-09-29 NOTE — Progress Notes (Signed)
      Primary Care Physician: Sinda Du, MD  Primary Gastroenterologist:  Garfield Cornea, MD   Chief Complaint  Patient presents with  . elevated LFT's  . Abdominal Pain    none at this time  . Constipation    doing okay while taking stool softner    HPI: Daniel Duffy is a 66 y.o. male here for follow up. Last seen in 08/2017. H/o abnormal LFTs, constipation, abd pain.  Patient seen in the ED back in July with epigastric pain. Noted to have new elevation of AST/ALT. Right upper quadrant ultrasound showed contracted gallbladder but no evidence of stones, no sonographic Murphy sign. Chest x-ray showed gastric distention. Patient was seen in the ED back in May as well as lower abdominal pain at that time. Complain of constipation. Suspected prostatitis at the time. Also head CT and pelvis with contrast with several enlarging low-density hepatic lesions, probable cyst. Pancreas unremarkable. Moderate stool throughout the colon. Prostate gland mildly enlarged, stable small supraumbilical hernia containing only fat.  Labs negative for iron overload, hep b and c. Repeat LFTs were improved but ast slightly elevated.   Presents today without complaints. No abd pain. Constipation much better with linzess. No melena, brbpr. No ugi complaints. Weight up 3 pounds in past six weeks.   Current Outpatient Prescriptions  Medication Sig Dispense Refill  . docusate sodium (COLACE) 100 MG capsule Take 100 mg by mouth daily.     . Multiple Vitamins-Minerals (MULTIVITAMINS THER. W/MINERALS) TABS Take 1 tablet by mouth daily.    Marland Kitchen omeprazole (PRILOSEC) 20 MG capsule Take 1 capsule (20 mg total) by mouth daily. 30 capsule 11  . tamsulosin (FLOMAX) 0.4 MG CAPS capsule Take 0.4 mg by mouth daily after breakfast.    . linaclotide (LINZESS) 145 MCG CAPS capsule Take 145 mcg by mouth daily before breakfast.     No current facility-administered medications for this visit.     Allergies as of 09/29/2017   . (No Known Allergies)    ROS:  General: Negative for anorexia, weight loss, fever, chills, fatigue, weakness. ENT: Negative for hoarseness, difficulty swallowing , nasal congestion. CV: Negative for chest pain, angina, palpitations, dyspnea on exertion, peripheral edema.  Respiratory: Negative for dyspnea at rest, dyspnea on exertion, cough, sputum, wheezing.  GI: See history of present illness. GU:  Negative for dysuria, hematuria, urinary incontinence, urinary frequency, nocturnal urination.  Endo: Negative for unusual weight change.    Physical Examination:   BP 118/80   Pulse 79   Temp 98 F (36.7 C) (Oral)   Ht 5' 11.5" (1.816 m)   Wt 161 lb (73 kg)   BMI 22.14 kg/m   General: Well-nourished, well-developed in no acute distress.  Eyes: No icterus. Mouth: Oropharyngeal mucosa moist and pink , no lesions erythema or exudate. Lungs: Clear to auscultation bilaterally.  Heart: Regular rate and rhythm, no murmurs rubs or gallops.  Abdomen: Bowel sounds are normal, nontender, nondistended, no hepatosplenomegaly or masses, no abdominal bruits or hernia , no rebound or guarding.   Extremities: No lower extremity edema. No clubbing or deformities. Neuro: Alert and oriented x 4   Skin: Warm and dry, no jaundice.   Psych: Alert and cooperative, normal mood and affect.  Labs:  See hpi Imaging Studies: No results found.

## 2017-10-01 ENCOUNTER — Ambulatory Visit: Payer: PPO | Admitting: Gastroenterology

## 2017-10-03 ENCOUNTER — Encounter: Payer: Self-pay | Admitting: Gastroenterology

## 2017-10-03 NOTE — Assessment & Plan Note (Signed)
Clinically improved. No further abd pain. Weight up 3 pounds. Appetite returned. Constipation well managed. Due for repeat LFTs in 4-6 weeks. We will have him come in for a weight check around that time and OV in four months. He should call sooner if any further appetite concerns or abd pain.

## 2017-10-04 NOTE — Progress Notes (Signed)
CC'ED TO PCP 

## 2017-10-20 ENCOUNTER — Other Ambulatory Visit: Payer: Self-pay

## 2017-10-20 DIAGNOSIS — R7989 Other specified abnormal findings of blood chemistry: Secondary | ICD-10-CM

## 2017-10-20 DIAGNOSIS — R945 Abnormal results of liver function studies: Principal | ICD-10-CM

## 2017-11-08 DIAGNOSIS — N5201 Erectile dysfunction due to arterial insufficiency: Secondary | ICD-10-CM | POA: Diagnosis not present

## 2017-11-08 DIAGNOSIS — R972 Elevated prostate specific antigen [PSA]: Secondary | ICD-10-CM | POA: Diagnosis not present

## 2017-12-01 DIAGNOSIS — R945 Abnormal results of liver function studies: Secondary | ICD-10-CM | POA: Diagnosis not present

## 2017-12-01 LAB — HEPATIC FUNCTION PANEL
AG RATIO: 1.4 (calc) (ref 1.0–2.5)
ALKALINE PHOSPHATASE (APISO): 69 U/L (ref 40–115)
ALT: 20 U/L (ref 9–46)
AST: 23 U/L (ref 10–35)
Albumin: 4 g/dL (ref 3.6–5.1)
BILIRUBIN INDIRECT: 0.3 mg/dL (ref 0.2–1.2)
Bilirubin, Direct: 0.1 mg/dL (ref 0.0–0.2)
Globulin: 2.8 g/dL (calc) (ref 1.9–3.7)
TOTAL PROTEIN: 6.8 g/dL (ref 6.1–8.1)
Total Bilirubin: 0.4 mg/dL (ref 0.2–1.2)

## 2017-12-10 ENCOUNTER — Encounter: Payer: Self-pay | Admitting: Internal Medicine

## 2017-12-11 NOTE — Progress Notes (Signed)
Please let patient know that lfts are normal. Recommend ov with rmr only, 01/2018.

## 2017-12-15 ENCOUNTER — Encounter: Payer: Self-pay | Admitting: Internal Medicine

## 2017-12-15 NOTE — Progress Notes (Signed)
PATIENT SCHEDULED  °

## 2017-12-22 DIAGNOSIS — E785 Hyperlipidemia, unspecified: Secondary | ICD-10-CM | POA: Diagnosis not present

## 2017-12-22 DIAGNOSIS — N401 Enlarged prostate with lower urinary tract symptoms: Secondary | ICD-10-CM | POA: Diagnosis not present

## 2017-12-22 DIAGNOSIS — K210 Gastro-esophageal reflux disease with esophagitis: Secondary | ICD-10-CM | POA: Diagnosis not present

## 2017-12-22 DIAGNOSIS — R945 Abnormal results of liver function studies: Secondary | ICD-10-CM | POA: Diagnosis not present

## 2018-01-17 DIAGNOSIS — M5416 Radiculopathy, lumbar region: Secondary | ICD-10-CM | POA: Diagnosis not present

## 2018-01-17 DIAGNOSIS — N401 Enlarged prostate with lower urinary tract symptoms: Secondary | ICD-10-CM | POA: Diagnosis not present

## 2018-01-17 DIAGNOSIS — K210 Gastro-esophageal reflux disease with esophagitis: Secondary | ICD-10-CM | POA: Diagnosis not present

## 2018-01-17 DIAGNOSIS — J019 Acute sinusitis, unspecified: Secondary | ICD-10-CM | POA: Diagnosis not present

## 2018-01-18 ENCOUNTER — Encounter: Payer: Self-pay | Admitting: *Deleted

## 2018-01-18 ENCOUNTER — Ambulatory Visit (INDEPENDENT_AMBULATORY_CARE_PROVIDER_SITE_OTHER): Payer: PPO | Admitting: Internal Medicine

## 2018-01-18 ENCOUNTER — Telehealth: Payer: Self-pay | Admitting: *Deleted

## 2018-01-18 ENCOUNTER — Other Ambulatory Visit: Payer: Self-pay | Admitting: *Deleted

## 2018-01-18 ENCOUNTER — Encounter: Payer: Self-pay | Admitting: Internal Medicine

## 2018-01-18 VITALS — BP 152/84 | HR 109 | Temp 98.3°F | Ht 71.0 in | Wt 162.4 lb

## 2018-01-18 DIAGNOSIS — K219 Gastro-esophageal reflux disease without esophagitis: Secondary | ICD-10-CM

## 2018-01-18 DIAGNOSIS — R7989 Other specified abnormal findings of blood chemistry: Secondary | ICD-10-CM

## 2018-01-18 DIAGNOSIS — R945 Abnormal results of liver function studies: Secondary | ICD-10-CM | POA: Diagnosis not present

## 2018-01-18 DIAGNOSIS — Z86010 Personal history of colonic polyps: Secondary | ICD-10-CM

## 2018-01-18 MED ORDER — PEG 3350-KCL-NA BICARB-NACL 420 G PO SOLR: 4000.0000 mL | Freq: Once | ORAL | 0 refills | Status: AC

## 2018-01-18 NOTE — Progress Notes (Signed)
Primary Care Physician:  Sinda Du, MD Primary Gastroenterologist:  Dr. Gala Romney  Pre-Procedure History & Physical: HPI:  Daniel Duffy is a 67 y.o. male here for follow-up of mildly elevated LFTs, constipation and abdominal pain. Since starting MiraLAX every other the day, he's had 3 good bowel movements weekly. Abdominal pain has resolved. Recent labs revealed completely normal LFTs, hepatitis B and C markers negative. Iron studies completely normal. He has a history of colonic adenoma removed 5 years ago and a positive family history of colonic polyps in his brother. He is due for surveillance colonoscopy now.  Not taking Linzess.  Reflux symptoms well controlled on omeprazole. No dysphagia.  Past Medical History:  Diagnosis Date  . Acid reflux   . Enlarged prostate     Past Surgical History:  Procedure Laterality Date  . COLONOSCOPY  12/29/2005   HGD:[id number] hemorrhoids, anal papilla/Diminutive polyp on a stalk at 10 cm/otherwise normal rectum and colon, PATH: hyperplastic polyp  . COLONOSCOPY N/A 04/10/2013   RMR: tubular adenoma: next TCS 04/2018  . ESOPHAGOGASTRODUODENOSCOPY  12/29/2005   HDQ:[id number] Schatzki's ring, small hiatal hernia, normal gastric mucosa, normal D1 and D2.   . HAND SURGERY     bilateral, got caught in a machine, two different occasions, right hand originally, than left hand. Left hand with skin graft  . SPINAL CORD DECOMPRESSION     History of a cervical spine injury falling off a roof. He has contractures of both hands.  Prior to Admission medications   Medication Sig Start Date End Date Taking? Authorizing Provider  docusate sodium (COLACE) 100 MG capsule Take 100 mg by mouth daily.    Yes [provider]  Multiple Vitamins-Minerals (MULTIVITAMINS THER. W/MINERALS) TABS Take 1 tablet by mouth daily.   Yes [provider]  omeprazole (PRILOSEC) 20 MG capsule Take 1 capsule (20 mg total) by mouth daily. 03/23/13  Yes Annitta Needs, NP  polyethylene glycol [facility] / Floria Raveling) packet Take 17 g by mouth every other day.   Yes [provider]  tamsulosin (FLOMAX) 0.4 MG CAPS capsule Take 0.4 mg by mouth daily after breakfast.   Yes [provider]  linaclotide (LINZESS) 145 MCG CAPS capsule Take 1 capsule (145 mcg total) by mouth daily before breakfast. Patient not taking: Reported on 01/18/2018 09/29/17   Mahala Menghini, PA-C    Allergies as of 01/18/2018  . (No Known Allergies)    Family History  Problem Relation Age of Onset  . Colon cancer Brother        63s    Social History   Socioeconomic History  . Marital status: Divorced    Spouse name: Not on file  . Number of children: Not on file  . Years of education: Not on file  . Highest education level: Not on file  Social Needs  . Financial resource strain: Not on file  . Food insecurity - worry: Not on file  . Food insecurity - inability: Not on file  . Transportation needs - medical: Not on file  . Transportation needs - non-medical: Not on file  Occupational History  . Not on file  Tobacco Use  . Smoking status: Never Smoker  . Smokeless tobacco: Never Used  Substance and Sexual Activity  . Alcohol use: No  . Drug use: No  . Sexual activity: No  Other Topics Concern  . Not on file  Social History Narrative  . Not on file    Review of  Systems: See HPI, otherwise negative ROS  Physical Exam: BP (!) 152/84   Pulse (!) 109   Temp 98.3 F (36.8 C) (Oral)   Ht 5\' 11"  (1.803 m)   Wt 162 lb 6.4 oz (73.7 kg)   BMI 22.65 kg/m  General:   Alert,  pleasant and cooperative in NAD Neck:  Supple; no masses or thyromegaly. No significant cervical adenopathy. Lungs:  Clear throughout to auscultation.   No wheezes, crackles, or rhonchi. No acute distress. Heart:  Regular rate and rhythm; no murmurs, clicks, rubs,  or gallops. Abdomen: Non-distended, normal bowel sounds.  Soft and nontender without appreciable mass or  hepatosplenomegaly.  Pulses:  Normal pulses noted. Extremities:  Significant contractures of both hands.  Impression:  Recent mild bump in AST (nonspecific) has resolved. Hepatic profile iron studies all came back normal. History of colonic adenoma; due for surveillance at this time.  GERD symptoms well controlled on omeprazole.  Recommendations:     I have offered the patient a surveillance colonoscopy.  The risks, benefits, limitations, alternatives and imponderables have been reviewed with the patient. Questions have been answered. All parties are agreeable.   Patient is to continue omeprazole daily  Further recommendations to follow          Notice: This dictation was prepared with Dragon dictation along with smaller phrase technology. Any transcriptional errors that result from this process are unintentional and may not be corrected upon review.

## 2018-01-18 NOTE — Telephone Encounter (Signed)
Spoke with pt and is aware pre-op scheduled for 02/03/18 at 12:45pm. Letter mailed

## 2018-01-18 NOTE — H&P (View-Only) (Signed)
Primary Care Physician:  Sinda Du, MD Primary Gastroenterologist:  Dr. Gala Romney  Pre-Procedure History & Physical: HPI:  Daniel Duffy is a 67 y.o. male here for follow-up of mildly elevated LFTs, constipation and abdominal pain. Since starting MiraLAX every other the day, he's had 3 good bowel movements weekly. Abdominal pain has resolved. Recent labs revealed completely normal LFTs, hepatitis B and C markers negative. Iron studies completely normal. He has a history of colonic adenoma removed 5 years ago and a positive family history of colonic polyps in his brother. He is due for surveillance colonoscopy now.  Not taking Linzess.  Reflux symptoms well controlled on omeprazole. No dysphagia.  Past Medical History:  Diagnosis Date  . Acid reflux   . Enlarged prostate     Past Surgical History:  Procedure Laterality Date  . COLONOSCOPY  12/29/2005   NIO:[id number] hemorrhoids, anal papilla/Diminutive polyp on a stalk at 10 cm/otherwise normal rectum and colon, PATH: hyperplastic polyp  . COLONOSCOPY N/A 04/10/2013   RMR: tubular adenoma: next TCS 04/2018  . ESOPHAGOGASTRODUODENOSCOPY  12/29/2005   FGH:[id number] Schatzki's ring, small hiatal hernia, normal gastric mucosa, normal D1 and D2.   . HAND SURGERY     bilateral, got caught in a machine, two different occasions, right hand originally, than left hand. Left hand with skin graft  . SPINAL CORD DECOMPRESSION     History of a cervical spine injury falling off a roof. He has contractures of both hands.  Prior to Admission medications   Medication Sig Start Date End Date Taking? Authorizing Provider  docusate sodium (COLACE) 100 MG capsule Take 100 mg by mouth daily.    Yes [provider]  Multiple Vitamins-Minerals (MULTIVITAMINS THER. W/MINERALS) TABS Take 1 tablet by mouth daily.   Yes [provider]  omeprazole (PRILOSEC) 20 MG capsule Take 1 capsule (20 mg total) by mouth daily. 03/23/13  Yes Annitta Needs, NP  polyethylene glycol [facility] / Floria Raveling) packet Take 17 g by mouth every other day.   Yes [provider]  tamsulosin (FLOMAX) 0.4 MG CAPS capsule Take 0.4 mg by mouth daily after breakfast.   Yes [provider]  linaclotide (LINZESS) 145 MCG CAPS capsule Take 1 capsule (145 mcg total) by mouth daily before breakfast. Patient not taking: Reported on 01/18/2018 09/29/17   Mahala Menghini, PA-C    Allergies as of 01/18/2018  . (No Known Allergies)    Family History  Problem Relation Age of Onset  . Colon cancer Brother        5s    Social History   Socioeconomic History  . Marital status: Divorced    Spouse name: Not on file  . Number of children: Not on file  . Years of education: Not on file  . Highest education level: Not on file  Social Needs  . Financial resource strain: Not on file  . Food insecurity - worry: Not on file  . Food insecurity - inability: Not on file  . Transportation needs - medical: Not on file  . Transportation needs - non-medical: Not on file  Occupational History  . Not on file  Tobacco Use  . Smoking status: Never Smoker  . Smokeless tobacco: Never Used  Substance and Sexual Activity  . Alcohol use: No  . Drug use: No  . Sexual activity: No  Other Topics Concern  . Not on file  Social History Narrative  . Not on file    Review of  Systems: See HPI, otherwise negative ROS  Physical Exam: BP (!) 152/84   Pulse (!) 109   Temp 98.3 F (36.8 C) (Oral)   Ht 5\' 11"  (1.803 m)   Wt 162 lb 6.4 oz (73.7 kg)   BMI 22.65 kg/m  General:   Alert,  pleasant and cooperative in NAD Neck:  Supple; no masses or thyromegaly. No significant cervical adenopathy. Lungs:  Clear throughout to auscultation.   No wheezes, crackles, or rhonchi. No acute distress. Heart:  Regular rate and rhythm; no murmurs, clicks, rubs,  or gallops. Abdomen: Non-distended, normal bowel sounds.  Soft and nontender without appreciable mass or  hepatosplenomegaly.  Pulses:  Normal pulses noted. Extremities:  Significant contractures of both hands.  Impression:  Recent mild bump in AST (nonspecific) has resolved. Hepatic profile iron studies all came back normal. History of colonic adenoma; due for surveillance at this time.  GERD symptoms well controlled on omeprazole.  Recommendations:     I have offered the patient a surveillance colonoscopy.  The risks, benefits, limitations, alternatives and imponderables have been reviewed with the patient. Questions have been answered. All parties are agreeable.   Patient is to continue omeprazole daily  Further recommendations to follow          Notice: This dictation was prepared with Dragon dictation along with smaller phrase technology. Any transcriptional errors that result from this process are unintentional and may not be corrected upon review.

## 2018-01-18 NOTE — Patient Instructions (Signed)
Schedule a surveillance colonoscopy - hx of polyps - propofol  Continue omeprazole daily  Further recommendations to follow

## 2018-01-31 DIAGNOSIS — R972 Elevated prostate specific antigen [PSA]: Secondary | ICD-10-CM | POA: Diagnosis not present

## 2018-02-01 NOTE — Patient Instructions (Signed)
Daniel Duffy  02/01/2018     @PREFPERIOPPHARMACY @   Your procedure is scheduled on  02/10/2018   Report to [facility] at  845   A.M.  Call this number if you have problems the morning of surgery:  [phone]   Remember:  Do not eat food or drink liquids after midnight.  Take these medicines the morning of surgery with A SIP OF WATER  Prilosec, flomax.   Do not wear jewelry, make-up or nail polish.  Do not wear lotions, powders, or perfumes, or deodorant.  Do not shave 48 hours prior to surgery.  Men may shave face and neck.  Do not bring valuables to the hospital.  [facility] is not responsible for any belongings or valuables.  Contacts, dentures or bridgework may not be worn into surgery.  Leave your suitcase in the car.  After surgery it may be brought to your room.  For patients admitted to the hospital, discharge time will be determined by your treatment team.  Patients discharged the day of surgery will not be allowed to drive home.   Name and phone number of your driver:   family Special instructions:  Follow the diet and prep instructions given to you by Dr Roseanne Kaufman office.  Please read over the following fact sheets that you were given. Anesthesia Post-op Instructions and Care and Recovery After Surgery       Colonoscopy, Adult A colonoscopy is an exam to look at the large intestine. It is done to check for problems, such as:  Lumps (tumors).  Growths (polyps).  Swelling (inflammation).  Bleeding.  What happens before the procedure? Eating and drinking Follow instructions from your doctor about eating and drinking. These instructions may include:  A few days before the procedure - follow a low-fiber diet. ? Avoid nuts. ? Avoid seeds. ? Avoid dried fruit. ? Avoid raw fruits. ? Avoid vegetables.  1-3 days before the procedure - follow a clear liquid diet. Avoid liquids that have red or purple dye. Drink only clear liquids, such  as: ? Clear broth or bouillon. ? Black coffee or tea. ? Clear juice. ? Clear soft drinks or sports drinks. ? Gelatin dessert. ? Popsicles.  On the day of the procedure - do not eat or drink anything during the 2 hours before the procedure.  Bowel prep If you were prescribed an oral bowel prep:  Take it as told by your doctor. Starting the day before your procedure, you will need to drink a lot of liquid. The liquid will cause you to poop (have bowel movements) until your poop is almost clear or light green.  If your skin or butt gets irritated from diarrhea, you may: ? Wipe the area with wipes that have medicine in them, such as adult wet wipes with aloe and vitamin E. ? Put something on your skin that soothes the area, such as petroleum jelly.  If you throw up (vomit) while drinking the bowel prep, take a break for up to 60 minutes. Then begin the bowel prep again. If you keep throwing up and you cannot take the bowel prep without throwing up, call your doctor.  General instructions  Ask your doctor about changing or stopping your normal medicines. This is important if you take diabetes medicines or blood thinners.  Plan to have someone take you home from the hospital or clinic. What happens during the procedure?  An IV tube may be  put into one of your veins.  You will be given medicine to help you relax (sedative).  To reduce your risk of infection: ? Your doctors will wash their hands. ? Your anal area will be washed with soap.  You will be asked to lie on your side with your knees bent.  Your doctor will get a long, thin, flexible tube ready. The tube will have a camera and a light on the end.  The tube will be put into your anus.  The tube will be gently put into your large intestine.  Air will be delivered into your large intestine to keep it open. You may feel some pressure or cramping.  The camera will be used to take photos.  A small tissue sample may be  removed from your body to be looked at under a microscope (biopsy). If any possible problems are found, the tissue will be sent to a lab for testing.  If small growths are found, your doctor may remove them and have them checked for cancer.  The tube that was put into your anus will be slowly removed. The procedure may vary among doctors and hospitals. What happens after the procedure?  Your doctor will check on you often until the medicines you were given have worn off.  Do not drive for 24 hours after the procedure.  You may have a small amount of blood in your poop.  You may pass gas.  You may have mild cramps or bloating in your belly (abdomen).  It is up to you to get the results of your procedure. Ask your doctor, or the department performing the procedure, when your results will be ready. This information is not intended to replace advice given to you by your health care provider. Make sure you discuss any questions you have with your health care provider. Document Released: 12/26/2010 Document Revised: 09/23/2016 Document Reviewed: 02/04/2016 Elsevier Interactive Patient Education  2017 [employer]  Colonoscopy, Adult, Care After This sheet gives you information about how to care for yourself after your procedure. Your health care provider may also give you more specific instructions. If you have problems or questions, contact your health care provider. What can I expect after the procedure? After the procedure, it is common to have:  A small amount of blood in your stool for 24 hours after the procedure.  Some gas.  Mild abdominal cramping or bloating.  Follow these instructions at home: General instructions   For the first 24 hours after the procedure: ? Do not drive or use machinery. ? Do not sign important documents. ? Do not drink alcohol. ? Do your regular daily activities at a slower pace than normal. ? Eat soft, easy-to-digest foods. ? Rest  often.  Take over-the-counter or prescription medicines only as told by your health care provider.  It is up to you to get the results of your procedure. Ask your health care provider, or the department performing the procedure, when your results will be ready. Relieving cramping and bloating  Try walking around when you have cramps or feel bloated.  Apply heat to your abdomen as told by your health care provider. Use a heat source that your health care provider recommends, such as a moist heat pack or a heating pad. ? Place a towel between your skin and the heat source. ? Leave the heat on for 20-30 minutes. ? Remove the heat if your skin turns bright red. This is especially important if you are  unable to feel pain, heat, or cold. You may have a greater risk of getting burned. Eating and drinking  Drink enough fluid to keep your urine clear or pale yellow.  Resume your normal diet as instructed by your health care provider. Avoid heavy or fried foods that are hard to digest.  Avoid drinking alcohol for as long as instructed by your health care provider. Contact a health care provider if:  You have blood in your stool 2-3 days after the procedure. Get help right away if:  You have more than a small spotting of blood in your stool.  You pass large blood clots in your stool.  Your abdomen is swollen.  You have nausea or vomiting.  You have a fever.  You have increasing abdominal pain that is not relieved with medicine. This information is not intended to replace advice given to you by your health care provider. Make sure you discuss any questions you have with your health care provider. Document Released: 07/07/2004 Document Revised: 08/17/2016 Document Reviewed: 02/04/2016 Elsevier Interactive Patient Education  2018 [place] Anesthesia is a term that refers to techniques, procedures, and medicines that help a person stay safe and comfortable  during a medical procedure. Monitored anesthesia care, or sedation, is one type of anesthesia. Your anesthesia specialist may recommend sedation if you will be having a procedure that does not require you to be unconscious, such as:  Cataract surgery.  A dental procedure.  A biopsy.  A colonoscopy.  During the procedure, you may receive a medicine to help you relax (sedative). There are three levels of sedation:  Mild sedation. At this level, you may feel awake and relaxed. You will be able to follow directions.  Moderate sedation. At this level, you will be sleepy. You may not remember the procedure.  Deep sedation. At this level, you will be asleep. You will not remember the procedure.  The more medicine you are given, the deeper your level of sedation will be. Depending on how you respond to the procedure, the anesthesia specialist may change your level of sedation or the type of anesthesia to fit your needs. An anesthesia specialist will monitor you closely during the procedure. Let your health care provider know about:  Any allergies you have.  All medicines you are taking, including vitamins, herbs, eye drops, creams, and over-the-counter medicines.  Any use of steroids (by mouth or as a cream).  Any problems you or family members have had with sedatives and anesthetic medicines.  Any blood disorders you have.  Any surgeries you have had.  Any medical conditions you have, such as sleep apnea.  Whether you are pregnant or may be pregnant.  Any use of cigarettes, alcohol, or street drugs. What are the risks? Generally, this is a safe procedure. However, problems may occur, including:  Getting too much medicine (oversedation).  Nausea.  Allergic reaction to medicines.  Trouble breathing. If this happens, a breathing tube may be used to help with breathing. It will be removed when you are awake and breathing on your own.  Heart trouble.  Lung trouble.  Before  the procedure Staying hydrated Follow instructions from your health care provider about hydration, which may include:  Up to 2 hours before the procedure - you may continue to drink clear liquids, such as water, clear fruit juice, black coffee, and plain tea.  Eating and drinking restrictions Follow instructions from your health care provider about eating and drinking, which  may include:  8 hours before the procedure - stop eating heavy meals or foods such as meat, fried foods, or fatty foods.  6 hours before the procedure - stop eating light meals or foods, such as toast or cereal.  6 hours before the procedure - stop drinking milk or drinks that contain milk.  2 hours before the procedure - stop drinking clear liquids.  Medicines Ask your health care provider about:  Changing or stopping your regular medicines. This is especially important if you are taking diabetes medicines or blood thinners.  Taking medicines such as aspirin and ibuprofen. These medicines can thin your blood. Do not take these medicines before your procedure if your health care provider instructs you not to.  Tests and exams  You will have a physical exam.  You may have blood tests done to show: ? How well your kidneys and liver are working. ? How well your blood can clot.  General instructions  Plan to have someone take you home from the hospital or clinic.  If you will be going home right after the procedure, plan to have someone with you for 24 hours.  What happens during the procedure?  Your blood pressure, heart rate, breathing, level of pain and overall condition will be monitored.  An IV tube will be inserted into one of your veins.  Your anesthesia specialist will give you medicines as needed to keep you comfortable during the procedure. This may mean changing the level of sedation.  The procedure will be performed. After the procedure  Your blood pressure, heart rate, breathing rate, and  blood oxygen level will be monitored until the medicines you were given have worn off.  Do not drive for 24 hours if you received a sedative.  You may: ? Feel sleepy, clumsy, or nauseous. ? Feel forgetful about what happened after the procedure. ? Have a sore throat if you had a breathing tube during the procedure. ? Vomit. This information is not intended to replace advice given to you by your health care provider. Make sure you discuss any questions you have with your health care provider. Document Released: 08/19/2005 Document Revised: 05/01/2016 Document Reviewed: 03/15/2016 Elsevier Interactive Patient Education  2018 [place], Care After These instructions provide you with information about caring for yourself after your procedure. Your health care provider may also give you more specific instructions. Your treatment has been planned according to current medical practices, but problems sometimes occur. Call your health care provider if you have any problems or questions after your procedure. What can I expect after the procedure? After your procedure, it is common to:  Feel sleepy for several hours.  Feel clumsy and have poor balance for several hours.  Feel forgetful about what happened after the procedure.  Have poor judgment for several hours.  Feel nauseous or vomit.  Have a sore throat if you had a breathing tube during the procedure.  Follow these instructions at home: For at least 24 hours after the procedure:   Do not: ? Participate in activities in which you could fall or become injured. ? Drive. ? Use heavy machinery. ? Drink alcohol. ? Take sleeping pills or medicines that cause drowsiness. ? Make important decisions or sign legal documents. ? Take care of children on your own.  Rest. Eating and drinking  Follow the diet that is recommended by your health care provider.  If you vomit, drink water, juice, or soup when you  can  drink without vomiting.  Make sure you have little or no nausea before eating solid foods. General instructions  Have a responsible adult stay with you until you are awake and alert.  Take over-the-counter and prescription medicines only as told by your health care provider.  If you smoke, do not smoke without supervision.  Keep all follow-up visits as told by your health care provider. This is important. Contact a health care provider if:  You keep feeling nauseous or you keep vomiting.  You feel light-headed.  You develop a rash.  You have a fever. Get help right away if:  You have trouble breathing. This information is not intended to replace advice given to you by your health care provider. Make sure you discuss any questions you have with your health care provider. Document Released: 03/15/2016 Document Revised: 07/15/2016 Document Reviewed: 03/15/2016 Elsevier Interactive Patient Education  [employer].

## 2018-02-03 ENCOUNTER — Encounter (HOSPITAL_COMMUNITY)
Admission: RE | Admit: 2018-02-03 | Discharge: 2018-02-03 | Disposition: A | Discharge status: Home / Self Care | Payer: PPO | Source: Ambulatory Visit | Attending: Internal Medicine | Admitting: Internal Medicine

## 2018-02-03 ENCOUNTER — Other Ambulatory Visit: Payer: Self-pay

## 2018-02-03 ENCOUNTER — Encounter (HOSPITAL_COMMUNITY): Payer: Self-pay

## 2018-02-03 DIAGNOSIS — Z01812 Encounter for preprocedural laboratory examination: Secondary | ICD-10-CM | POA: Diagnosis not present

## 2018-02-03 DIAGNOSIS — Z01810 Encounter for preprocedural cardiovascular examination: Secondary | ICD-10-CM | POA: Insufficient documentation

## 2018-02-03 HISTORY — DX: Polyp of colon: K63.5

## 2018-02-03 LAB — COMPREHENSIVE METABOLIC PANEL
ALBUMIN: 3.5 g/dL (ref 3.5–5.0)
ALK PHOS: 57 U/L (ref 38–126)
ALT: 19 U/L (ref 17–63)
ANION GAP: 6 (ref 5–15)
AST: 22 U/L (ref 15–41)
BUN: 15 mg/dL (ref 6–20)
CALCIUM: 10.3 mg/dL (ref 8.9–10.3)
CO2: 25 mmol/L (ref 22–32)
Chloride: 102 mmol/L (ref 101–111)
Creatinine, Ser: 1.07 mg/dL (ref 0.61–1.24)
GFR calc Af Amer: >60 mL/min (ref >60)
GFR calc non Af Amer: >60 mL/min (ref >60)
GLUCOSE: 102 mg/dL — AB (ref 65–99)
POTASSIUM: 4 mmol/L (ref 3.5–5.1)
SODIUM: 133 mmol/L — AB (ref 135–145)
Total Bilirubin: 0.4 mg/dL (ref 0.3–1.2)
Total Protein: 6.9 g/dL (ref 6.5–8.1)

## 2018-02-03 LAB — CBC WITH DIFFERENTIAL/PLATELET
Basophils Absolute: 0 10*3/uL (ref 0.0–0.1)
Basophils Relative: 0 %
EOS ABS: 0.1 10*3/uL (ref 0.0–0.7)
Eosinophils Relative: 3 %
HCT: 42.6 % (ref 39.0–52.0)
HEMOGLOBIN: 13.4 g/dL (ref 13.0–17.0)
Lymphocytes Relative: 41 %
Lymphs Abs: 1.1 10*3/uL (ref 0.7–4.0)
MCH: 27.2 pg (ref 26.0–34.0)
MCHC: 31.5 g/dL (ref 30.0–36.0)
MCV: 86.4 fL (ref 78.0–100.0)
MONOS PCT: 9 %
Monocytes Absolute: 0.3 10*3/uL (ref 0.1–1.0)
NEUTROS ABS: 1.3 10*3/uL — AB (ref 1.7–7.7)
NEUTROS PCT: 47 %
Platelets: 206 10*3/uL (ref 150–400)
RBC: 4.93 MIL/uL (ref 4.22–5.81)
RDW: 14.8 % (ref 11.5–15.5)
WBC: 2.8 10*3/uL — AB (ref 4.0–10.5)

## 2018-02-08 DIAGNOSIS — R972 Elevated prostate specific antigen [PSA]: Secondary | ICD-10-CM | POA: Diagnosis not present

## 2018-02-08 DIAGNOSIS — N5201 Erectile dysfunction due to arterial insufficiency: Secondary | ICD-10-CM | POA: Diagnosis not present

## 2018-02-10 ENCOUNTER — Encounter (HOSPITAL_COMMUNITY)
Admission: RE | Disposition: A | Discharge status: Home / Self Care | Payer: Self-pay | Source: Ambulatory Visit | Attending: Internal Medicine

## 2018-02-10 ENCOUNTER — Ambulatory Visit (HOSPITAL_COMMUNITY)
Admission: RE | Admit: 2018-02-10 | Discharge: 2018-02-10 | Disposition: A | Discharge status: Home / Self Care | Payer: PPO | Source: Ambulatory Visit | Attending: Internal Medicine | Admitting: Internal Medicine

## 2018-02-10 ENCOUNTER — Ambulatory Visit (HOSPITAL_COMMUNITY): Payer: PPO | Admitting: Anesthesiology

## 2018-02-10 ENCOUNTER — Encounter (HOSPITAL_COMMUNITY): Payer: Self-pay | Admitting: *Deleted

## 2018-02-10 DIAGNOSIS — Z860100 Personal history of colon polyps, unspecified: Secondary | ICD-10-CM

## 2018-02-10 DIAGNOSIS — K5730 Diverticulosis of large intestine without perforation or abscess without bleeding: Secondary | ICD-10-CM | POA: Diagnosis not present

## 2018-02-10 DIAGNOSIS — N400 Enlarged prostate without lower urinary tract symptoms: Secondary | ICD-10-CM | POA: Insufficient documentation

## 2018-02-10 DIAGNOSIS — K219 Gastro-esophageal reflux disease without esophagitis: Secondary | ICD-10-CM | POA: Diagnosis not present

## 2018-02-10 DIAGNOSIS — Z79899 Other long term (current) drug therapy: Secondary | ICD-10-CM | POA: Diagnosis not present

## 2018-02-10 DIAGNOSIS — Z800 Family history of malignant neoplasm of digestive organs: Secondary | ICD-10-CM | POA: Insufficient documentation

## 2018-02-10 DIAGNOSIS — Z8371 Family history of colonic polyps: Secondary | ICD-10-CM | POA: Diagnosis not present

## 2018-02-10 DIAGNOSIS — Z1211 Encounter for screening for malignant neoplasm of colon: Secondary | ICD-10-CM | POA: Diagnosis not present

## 2018-02-10 HISTORY — PX: COLONOSCOPY WITH PROPOFOL: SHX5780

## 2018-02-10 LAB — HM COLONOSCOPY

## 2018-02-10 SURGERY — COLONOSCOPY WITH PROPOFOL
Anesthesia: Monitor Anesthesia Care

## 2018-02-10 MED ORDER — MIDAZOLAM HCL 2 MG/2ML IJ SOLN
INTRAMUSCULAR | Status: AC
  Filled 2018-02-10: qty 2

## 2018-02-10 MED ORDER — LACTATED RINGERS IV SOLN
INTRAVENOUS | Status: DC
  Administered 2018-02-10: 10:00:00 via INTRAVENOUS

## 2018-02-10 MED ORDER — PROPOFOL 10 MG/ML IV BOLUS
INTRAVENOUS | Status: AC
  Filled 2018-02-10: qty 40

## 2018-02-10 MED ORDER — CHLORHEXIDINE GLUCONATE CLOTH 2 % EX PADS: 6.0000 | MEDICATED_PAD | Freq: Once | CUTANEOUS | Status: DC

## 2018-02-10 MED ORDER — PROPOFOL 500 MG/50ML IV EMUL
INTRAVENOUS | Status: DC | PRN
  Administered 2018-02-10: 150 ug/kg/min via INTRAVENOUS
  Administered 2018-02-10: 12:00:00 via INTRAVENOUS

## 2018-02-10 MED ORDER — FENTANYL CITRATE (PF) 100 MCG/2ML IJ SOLN
INTRAMUSCULAR | Status: AC
  Filled 2018-02-10: qty 2

## 2018-02-10 MED ORDER — MIDAZOLAM HCL 2 MG/2ML IJ SOLN
1.0000 mg | INTRAMUSCULAR | Status: AC
  Administered 2018-02-10: 2 mg via INTRAVENOUS

## 2018-02-10 MED ORDER — PROPOFOL 10 MG/ML IV BOLUS
INTRAVENOUS | Status: DC | PRN
  Administered 2018-02-10: 30 mg via INTRAVENOUS

## 2018-02-10 MED ORDER — FENTANYL CITRATE (PF) 100 MCG/2ML IJ SOLN
25.0000 ug | Freq: Once | INTRAMUSCULAR | Status: AC
  Administered 2018-02-10: 25 ug via INTRAVENOUS
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
## 2018-02-10 NOTE — Discharge Instructions (Signed)
PATIENT INSTRUCTIONS POST-ANESTHESIA  IMMEDIATELY FOLLOWING SURGERY:  Do not drive or operate machinery for the first twenty four hours after surgery.  Do not make any important decisions for twenty four hours after surgery or while taking narcotic pain medications or sedatives.  If you develop intractable nausea and vomiting or a severe headache please notify your doctor immediately.  FOLLOW-UP:  Please make an appointment with your surgeon as instructed. You do not need to follow up with anesthesia unless specifically instructed to do so.  WOUND CARE INSTRUCTIONS (if applicable):  Keep a dry clean dressing on the anesthesia/puncture wound site if there is drainage.  Once the wound has quit draining you may leave it open to air.  Generally you should leave the bandage intact for twenty four hours unless there is drainage.  If the epidural site drains for more than 36-48 hours please call the anesthesia department.  QUESTIONS?:  Please feel free to call your physician or the hospital operator if you have any questions, and they will be happy to assist you.       Colonoscopy Discharge Instructions  Read the instructions outlined below and refer to this sheet in the next few weeks. These discharge instructions provide you with general information on caring for yourself after you leave the hospital. Your doctor may also give you specific instructions. While your treatment has been planned according to the most current medical practices available, unavoidable complications occasionally occur. If you have any problems or questions after discharge, call Dr. Gala Romney at [phone]. ACTIVITY  You may resume your regular activity, but move at a slower pace for the next 24 hours.   Take frequent rest periods for the next 24 hours.   Walking will help get rid of the air and reduce the bloated feeling in your belly (abdomen).   No driving for 24 hours (because of the medicine (anesthesia) used during the  test).    Do not sign any important legal documents or operate any machinery for 24 hours (because of the anesthesia used during the test).  NUTRITION  Drink plenty of fluids.   You may resume your normal diet as instructed by your doctor.   Begin with a light meal and progress to your normal diet. Heavy or fried foods are harder to digest and may make you feel sick to your stomach (nauseated).   Avoid alcoholic beverages for 24 hours or as instructed.  MEDICATIONS  You may resume your normal medications unless your doctor tells you otherwise.  WHAT YOU CAN EXPECT TODAY  Some feelings of bloating in the abdomen.   Passage of more gas than usual.   Spotting of blood in your stool or on the toilet paper.  IF YOU HAD POLYPS REMOVED DURING THE COLONOSCOPY:  No aspirin products for 7 days or as instructed.   No alcohol for 7 days or as instructed.   Eat a soft diet for the next 24 hours.  FINDING OUT THE RESULTS OF YOUR TEST Not all test results are available during your visit. If your test results are not back during the visit, make an appointment with your caregiver to find out the results. Do not assume everything is normal if you have not heard from your caregiver or the medical facility. It is important for you to follow up on all of your test results.  SEEK IMMEDIATE MEDICAL ATTENTION IF:  You have more than a spotting of blood in your stool.   Your belly is swollen (  abdominal distention).   You are nauseated or vomiting.   You have a temperature over 101.   You have abdominal pain or discomfort that is severe or gets worse throughout the day.    Diverticulosis information provided  Repeat colonoscopy in 5 years   Diverticulosis Diverticulosis is a condition that develops when small pouches (diverticula) form in the wall of the large intestine (colon). The colon is where water is absorbed and stool is formed. The pouches form when the inside layer of the colon  pushes through weak spots in the outer layers of the colon. You may have a few pouches or many of them. What are the causes? The cause of this condition is not known. What increases the risk? The following factors may make you more likely to develop this condition:  Being older than age 84. Your risk for this condition increases with age. Diverticulosis is rare among people younger than age 45. By age 39, many people have it.  Eating a low-fiber diet.  Having frequent constipation.  Being overweight.  Not getting enough exercise.  Smoking.  Taking over-the-counter pain medicines, like aspirin and ibuprofen.  Having a family history of diverticulosis.  What are the signs or symptoms? In most people, there are no symptoms of this condition. If you do have symptoms, they may include:  Bloating.  Cramps in the abdomen.  Constipation or diarrhea.  Pain in the lower left side of the abdomen.  How is this diagnosed? This condition is most often diagnosed during an exam for other colon problems. Because diverticulosis usually has no symptoms, it often cannot be diagnosed independently. This condition may be diagnosed by:  Using a flexible scope to examine the colon (colonoscopy).  Taking an X-ray of the colon after dye has been put into the colon (barium enema).  Doing a CT scan.  How is this treated? You may not need treatment for this condition if you have never developed an infection related to diverticulosis. If you have had an infection before, treatment may include:  Eating a high-fiber diet. This may include eating more fruits, vegetables, and grains.  Taking a fiber supplement.  Taking a live bacteria supplement (probiotic).  Taking medicine to relax your colon.  Taking antibiotic medicines.  Follow these instructions at home:  Drink 6-8 glasses of water or more each day to prevent constipation.  Try not to strain when you have a bowel movement.  If you  have had an infection before: ? Eat more fiber as directed by your health care provider or your diet and nutrition specialist (dietitian). ? Take a fiber supplement or probiotic, if your health care provider approves.  Take over-the-counter and prescription medicines only as told by your health care provider.  If you were prescribed an antibiotic, take it as told by your health care provider. Do not stop taking the antibiotic even if you start to feel better.  Keep all follow-up visits as told by your health care provider. This is important. Contact a health care provider if:  You have pain in your abdomen.  You have bloating.  You have cramps.  You have not had a bowel movement in 3 days. Get help right away if:  Your pain gets worse.  Your bloating becomes very bad.  You have a fever or chills, and your symptoms suddenly get worse.  You vomit.  You have bowel movements that are bloody or black.  You have bleeding from your rectum. Summary  Diverticulosis  is a condition that develops when small pouches (diverticula) form in the wall of the large intestine (colon).  You may have a few pouches or many of them.  This condition is most often diagnosed during an exam for other colon problems.  If you have had an infection related to diverticulosis, treatment may include increasing the fiber in your diet, taking supplements, or taking medicines. This information is not intended to replace advice given to you by your health care provider. Make sure you discuss any questions you have with your health care provider.

## 2018-02-10 NOTE — Transfer of Care (Signed)
Immediate Anesthesia Transfer of Care Note  Patient: Daniel Duffy  Procedure(s) Performed: COLONOSCOPY WITH PROPOFOL (N/A )  Patient Location: PACU  Anesthesia Type:MAC  Level of Consciousness: awake  Airway & Oxygen Therapy: Patient Spontanous Breathing and Patient connected to nasal cannula oxygen  Post-op Assessment: Report given to RN  Post vital signs: Reviewed and stable  Last Vitals:  Vitals:   02/10/18 1115 02/10/18 1130  BP: 131/88 124/84  Resp: (!) 28 15  Temp:    SpO2: 99% 99%    Last Pain:  Vitals:   02/10/18 0912  TempSrc: Oral      Patients Stated Pain Goal: 7 ([id number])  Complications: No apparent anesthesia complications

## 2018-02-10 NOTE — Interval H&P Note (Signed)
History and Physical Interval Note:  02/10/2018 11:27 AM  Daniel Duffy  has presented today for surgery, with the diagnosis of h/o of colon polyps  The various methods of treatment have been discussed with the patient and family. After consideration of risks, benefits and other options for treatment, the patient has consented to  Procedure(s): COLONOSCOPY WITH PROPOFOL (N/A) as a surgical intervention .  The patient's history has been reviewed, patient examined, no change in status, stable for surgery.  I have reviewed the patient's chart and labs.  Questions were answered to the patient's satisfaction.     Daniel Duffy  No change. Surveillance colonoscopy today per plan.The risks, benefits, limitations, alternatives and imponderables have been reviewed with the patient. Questions have been answered. All parties are agreeable.

## 2018-02-10 NOTE — Anesthesia Postprocedure Evaluation (Signed)
Anesthesia Post Note  Patient: Daniel Duffy  Procedure(s) Performed: COLONOSCOPY WITH PROPOFOL (N/A )  Patient location during evaluation: Short Stay Anesthesia Type: MAC Level of consciousness: awake and alert and oriented Pain management: pain level controlled Vital Signs Assessment: post-procedure vital signs reviewed and stable Respiratory status: spontaneous breathing Cardiovascular status: blood pressure returned to baseline Postop Assessment: no apparent nausea or vomiting and patient able to bend at knees Anesthetic complications: no     Last Vitals:  Vitals:   02/10/18 1230 02/10/18 1233  BP: (!) 118/91 123/73  Pulse: 66 70  Resp: 16 16  Temp:  36.4 C  SpO2: 99% 99%    Last Pain:  Vitals:   02/10/18 1233  TempSrc: Oral                 [name]

## 2018-02-10 NOTE — Op Note (Signed)
[facility] Patient Name: Daniel Duffy Procedure Date: 02/10/2018 11:10 AM MRN: [id number] Date of Birth: [birth year] Attending MD: Norvel Richards , MD CSN: [id number] Age: 67 Admit Type: Outpatient Procedure:                Colonoscopy Indications:              High risk colon cancer surveillance: Personal                            history of colonic polyps Providers:                Norvel Richards, MD, Lurline Del, RN, Aram Candela Referring MD:              Medicines:                Propofol per Anesthesia Complications:            No immediate complications. Estimated Blood Loss:     Estimated blood loss: none. Procedure:                Pre-Anesthesia Assessment:                           - Prior to the procedure, a History and Physical                            was performed, and patient medications and                            allergies were reviewed. The patient's tolerance of                            previous anesthesia was also reviewed. The risks                            and benefits of the procedure and the sedation                            options and risks were discussed with the patient.                            All questions were answered, and informed consent                            was obtained. Prior Anticoagulants: The patient has                            taken no previous anticoagulant or antiplatelet                            agents. ASA Grade Assessment: II - A patient with  mild systemic disease. After reviewing the risks                            and benefits, the patient was deemed in                            satisfactory condition to undergo the procedure.                           After obtaining informed consent, the colonoscope                            was passed under direct vision. Throughout the                            procedure, the patient's blood pressure,  pulse, and                            oxygen saturations were monitored continuously. The                            EC-[id number] ([id number]) scope was introduced through                            the and advanced to the the cecum, identified by                            appendiceal orifice and ileocecal valve. The                            colonoscopy was performed without difficulty. The                            patient tolerated the procedure well. The quality                            of the bowel preparation was adequate. The                            ileocecal valve, appendiceal orifice, and rectum                            were photographed. Scope In: 11:41:33 AM Scope Out: 11:59:16 AM Scope Withdrawal Time: 0 hours 6 minutes 19 seconds  Total Procedure Duration: 0 hours 17 minutes 43 seconds  Findings:      The perianal and digital rectal examinations were normal.      Scattered medium-mouthed diverticula were found in the entire colon.      The exam was otherwise without abnormality on direct and retroflexion       views. Impression:               - Diverticulosis in the entire examined colon.                           -  The examination was otherwise normal on direct                            and retroflexion views.                           - No specimens collected. Moderate Sedation:      Moderate (conscious) sedation was personally administered by an       anesthesia professional. The following parameters were monitored: oxygen       saturation, heart rate, blood pressure, respiratory rate, EKG, adequacy       of pulmonary ventilation, and response to care. Total physician       intraservice time was 24 minutes. Recommendation:           - Patient has a contact number available for                            emergencies. The signs and symptoms of potential                            delayed complications were discussed with the                            patient.  Return to normal activities tomorrow.                            Written discharge instructions were provided to the                            patient.                           - Resume previous diet.                           - Continue present medications.                           - Repeat colonoscopy in 5 years for surveillance.                           - Return to GI clinic PRN. Procedure Code(s):        --- Professional ---                           [id number], Colonoscopy, flexible; diagnostic, including                            collection of specimen(s) by brushing or washing,                            when performed (separate procedure) Diagnosis Code(s):        --- Professional ---                           Z86.010, Personal history of colonic polyps  K57.30, Diverticulosis of large intestine without                            perforation or abscess without bleeding CPT copyright 2016 [employer]. All rights reserved. The codes documented in this report are preliminary and upon coder review may  be revised to meet current compliance requirements. Cristopher Estimable. Kahil Agner, MD Norvel Richards, MD 02/10/2018 12:04:37 PM This report has been signed electronically. Number of Addenda: 0

## 2018-02-15 ENCOUNTER — Encounter (HOSPITAL_COMMUNITY): Payer: Self-pay | Admitting: Internal Medicine

## 2018-03-29 DIAGNOSIS — Z0000 Encounter for general adult medical examination without abnormal findings: Secondary | ICD-10-CM | POA: Diagnosis not present

## 2018-03-30 DIAGNOSIS — R634 Abnormal weight loss: Secondary | ICD-10-CM | POA: Diagnosis not present

## 2018-03-30 DIAGNOSIS — N528 Other male erectile dysfunction: Secondary | ICD-10-CM | POA: Diagnosis not present

## 2018-03-30 DIAGNOSIS — R740 Nonspecific elevation of levels of transaminase and lactic acid dehydrogenase [LDH]: Secondary | ICD-10-CM | POA: Diagnosis not present

## 2018-03-30 DIAGNOSIS — E785 Hyperlipidemia, unspecified: Secondary | ICD-10-CM | POA: Diagnosis not present

## 2018-03-30 LAB — TSH: TSH: 1.34 (ref 0.41–5.90)

## 2018-10-07 DIAGNOSIS — Z23 Encounter for immunization: Secondary | ICD-10-CM | POA: Diagnosis not present

## 2018-11-10 DIAGNOSIS — N138 Other obstructive and reflux uropathy: Secondary | ICD-10-CM | POA: Diagnosis not present

## 2018-11-10 DIAGNOSIS — R21 Rash and other nonspecific skin eruption: Secondary | ICD-10-CM | POA: Diagnosis not present

## 2018-11-10 DIAGNOSIS — R634 Abnormal weight loss: Secondary | ICD-10-CM | POA: Diagnosis not present

## 2018-11-10 DIAGNOSIS — K210 Gastro-esophageal reflux disease with esophagitis: Secondary | ICD-10-CM | POA: Diagnosis not present

## 2019-05-22 DIAGNOSIS — Z0000 Encounter for general adult medical examination without abnormal findings: Secondary | ICD-10-CM | POA: Diagnosis not present

## 2019-05-22 DIAGNOSIS — Z23 Encounter for immunization: Secondary | ICD-10-CM | POA: Diagnosis not present

## 2019-05-23 DIAGNOSIS — N528 Other male erectile dysfunction: Secondary | ICD-10-CM | POA: Diagnosis not present

## 2019-05-23 DIAGNOSIS — R634 Abnormal weight loss: Secondary | ICD-10-CM | POA: Diagnosis not present

## 2019-05-23 DIAGNOSIS — E785 Hyperlipidemia, unspecified: Secondary | ICD-10-CM | POA: Diagnosis not present

## 2019-05-23 DIAGNOSIS — K5904 Chronic idiopathic constipation: Secondary | ICD-10-CM | POA: Diagnosis not present

## 2019-05-23 LAB — CBC: RBC: 5.23 — AB (ref 3.87–5.11)

## 2019-05-23 LAB — COMPREHENSIVE METABOLIC PANEL
Albumin: 3.9 (ref 3.5–5.0)
Calcium: 10.3 (ref 8.7–10.7)
GFR calc Af Amer: 82
GFR calc non Af Amer: 70
Globulin: 2.8

## 2019-05-23 LAB — CBC AND DIFFERENTIAL
HCT: 44 (ref 41–53)
Hemoglobin: 14.4 (ref 13.5–17.5)
Neutrophils Absolute: 3254
Platelets: 207 (ref 150–399)
WBC: 4.8

## 2019-05-23 LAB — BASIC METABOLIC PANEL
BUN: 10 (ref 4–21)
CO2: 27 — AB (ref 13–22)
Chloride: 106 (ref 99–108)
Creatinine: 1.1 (ref <=1.3)
Glucose: 110
Potassium: 4.1 (ref 3.4–5.3)
Sodium: 138 (ref 137–147)

## 2019-05-23 LAB — HEPATIC FUNCTION PANEL
ALT: 22 (ref 10–40)
AST: 25 (ref 14–40)
Alkaline Phosphatase: 64 (ref 25–125)

## 2019-05-23 LAB — LIPID PANEL
Cholesterol: 201 — AB (ref 0–200)
HDL: 51 (ref 35–70)
LDL Cholesterol: 134
Triglycerides: 65 (ref 40–160)

## 2019-05-23 LAB — PSA: PSA: 3.9

## 2019-07-10 ENCOUNTER — Other Ambulatory Visit: Payer: Self-pay

## 2019-07-14 ENCOUNTER — Other Ambulatory Visit (HOSPITAL_COMMUNITY): Payer: Self-pay | Admitting: Pulmonary Disease

## 2019-07-14 ENCOUNTER — Other Ambulatory Visit: Payer: Self-pay

## 2019-07-14 ENCOUNTER — Ambulatory Visit (HOSPITAL_COMMUNITY)
Admission: RE | Admit: 2019-07-14 | Discharge: 2019-07-14 | Disposition: A | Discharge status: Home / Self Care | Payer: PPO | Source: Ambulatory Visit | Attending: Pulmonary Disease | Admitting: Pulmonary Disease

## 2019-07-14 DIAGNOSIS — M542 Cervicalgia: Secondary | ICD-10-CM | POA: Diagnosis not present

## 2019-07-14 DIAGNOSIS — E785 Hyperlipidemia, unspecified: Secondary | ICD-10-CM | POA: Diagnosis not present

## 2019-07-14 DIAGNOSIS — N138 Other obstructive and reflux uropathy: Secondary | ICD-10-CM | POA: Diagnosis not present

## 2019-07-14 DIAGNOSIS — K210 Gastro-esophageal reflux disease with esophagitis: Secondary | ICD-10-CM | POA: Diagnosis not present

## 2019-07-14 IMAGING — DX CERVICAL SPINE - COMPLETE 4+ VIEW
6 series · 6 of 6 positions shown · non-contrast
Comparison: None.

CLINICAL DATA: Right-sided neck pain radiating to the right arm.

EXAM:
CERVICAL SPINE - COMPLETE 4+ VIEW

[c-spine lat]
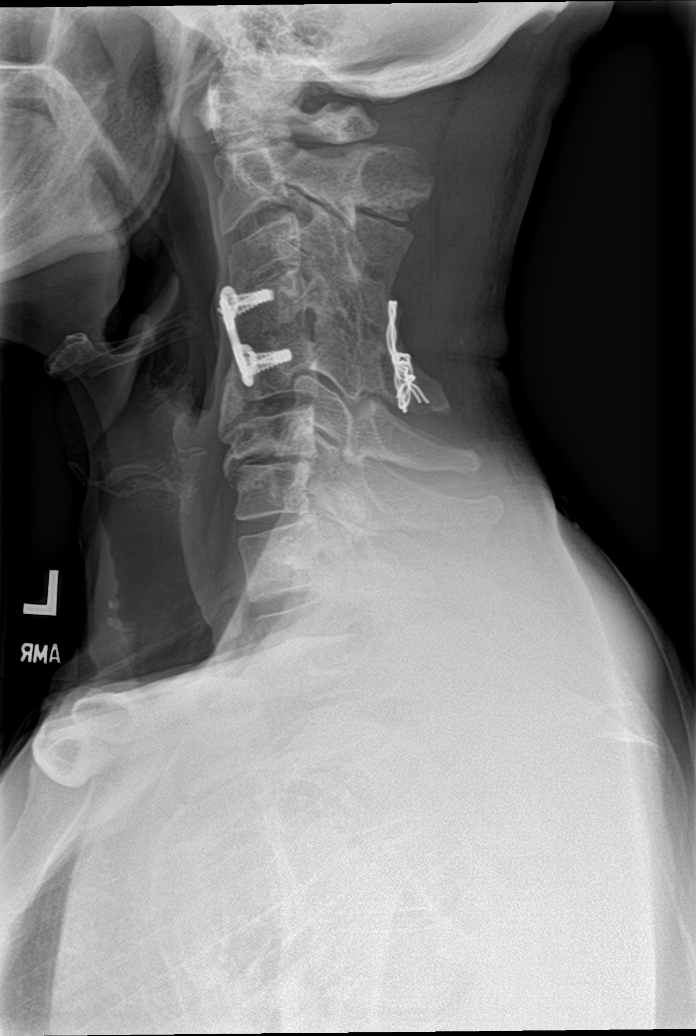

[c-spine obl (1 of 2)]
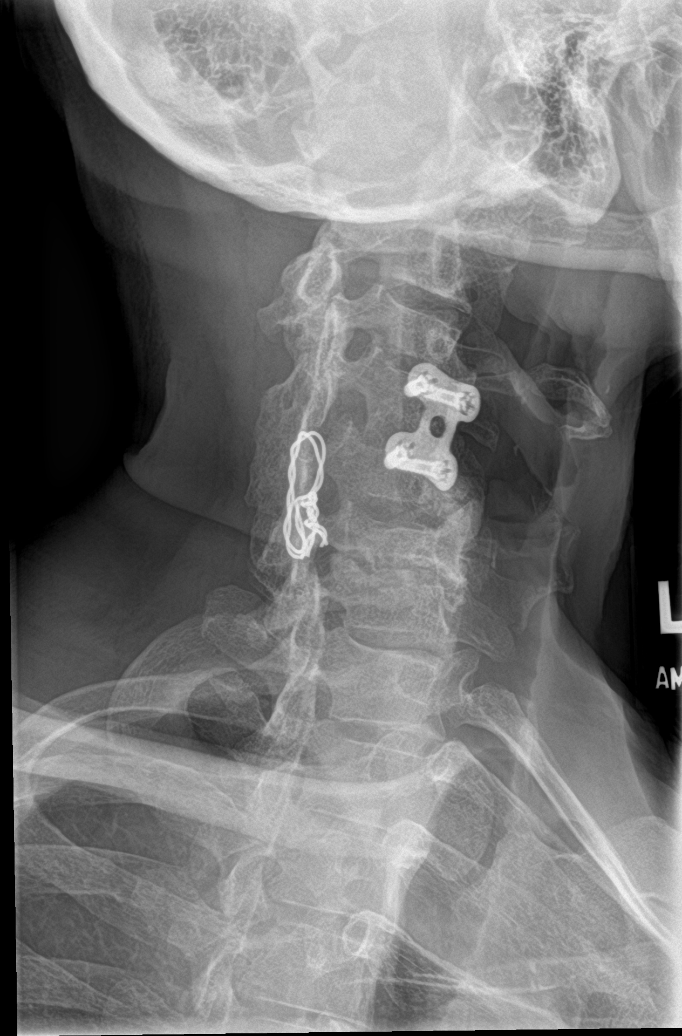

[c-spine obl (2 of 2)]
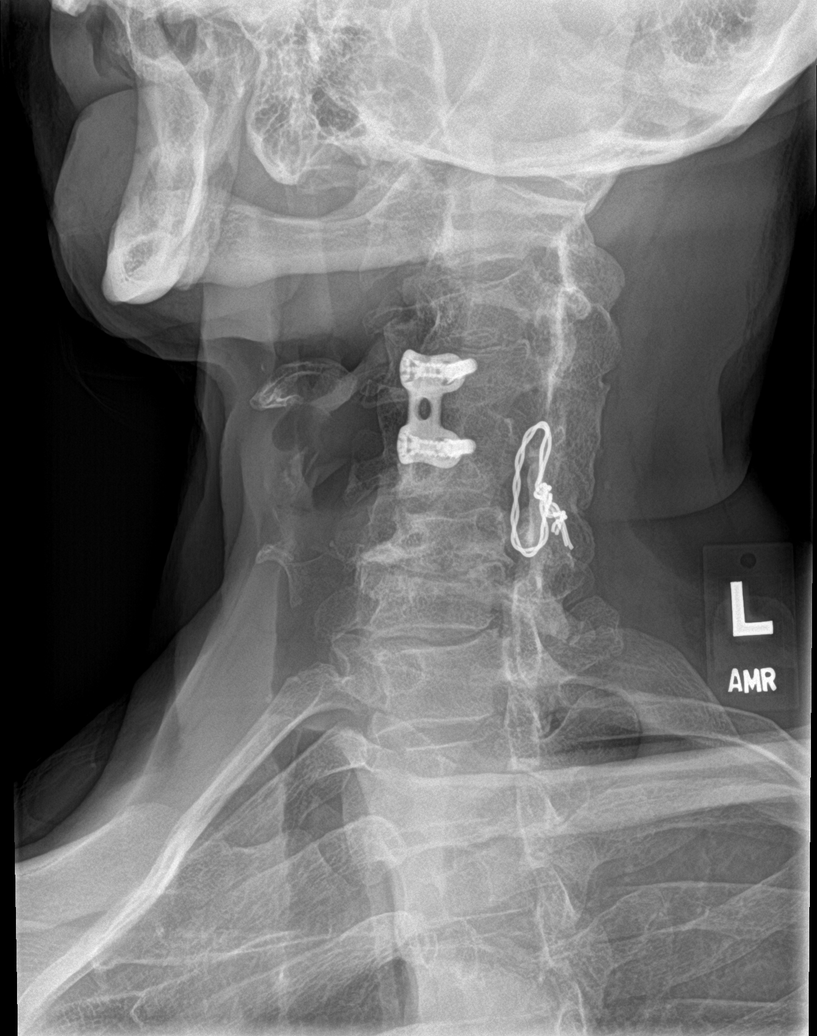

[c-spine ap]
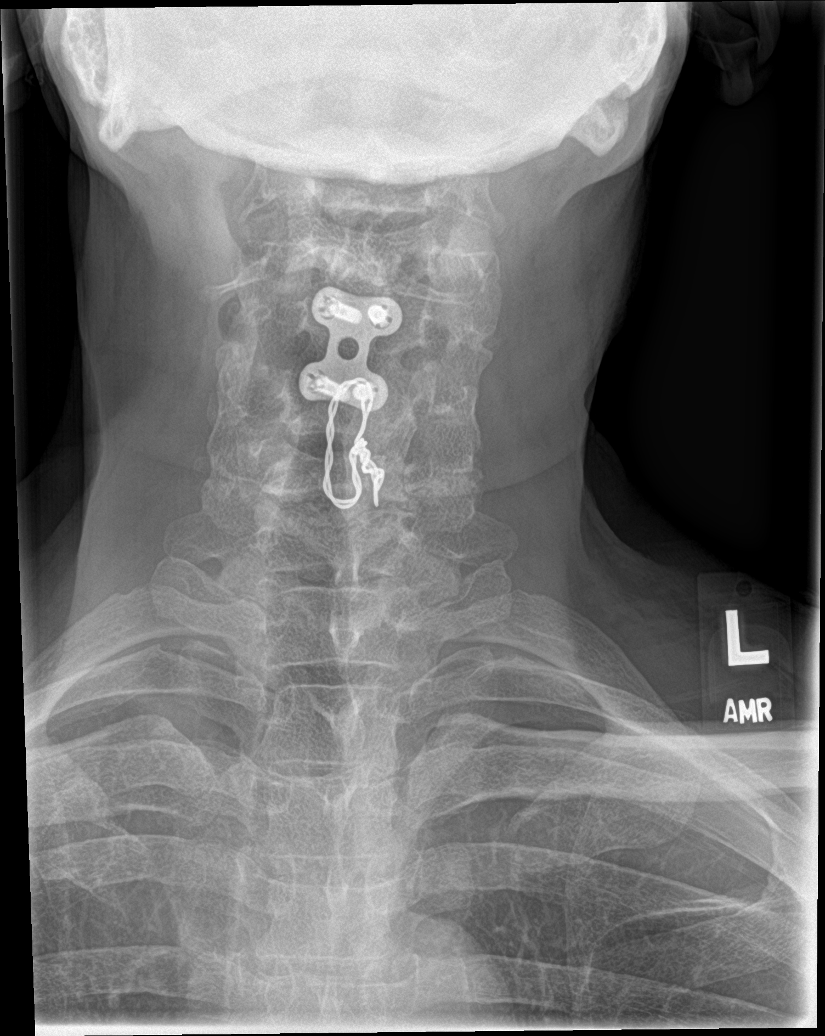

[c-spine open mouth]
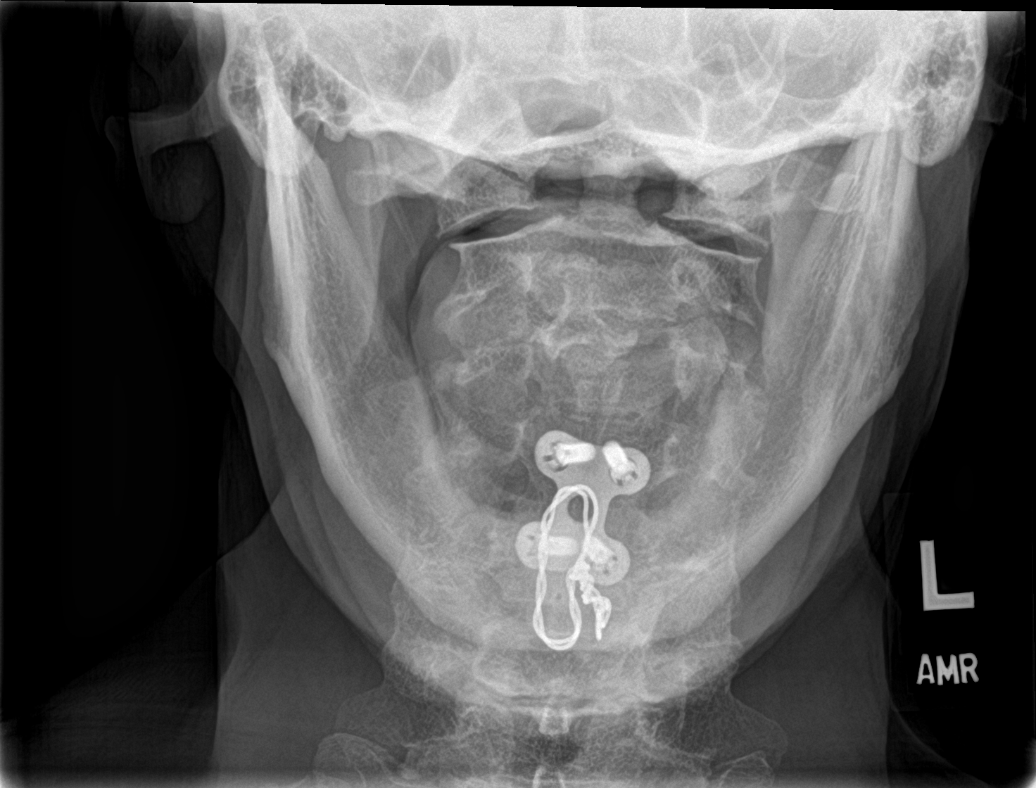

[c-spine swimmers trauma]
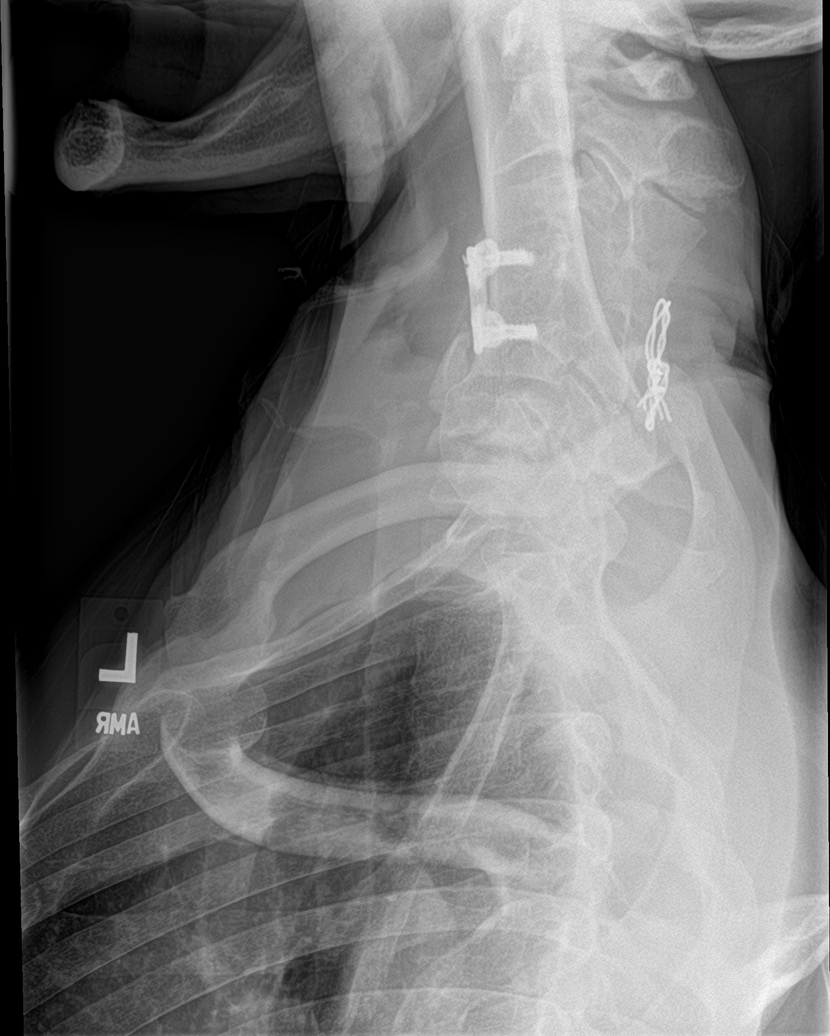

[6 of 6 positions shown; findings below may reference images not displayed]

FINDINGS: Straightening of the cervical lordosis. Prior C4-C5 anterior and
posterior fusion. Ankylosing changes of the posterior elements of C3
through C5. Osteoarthritic changes at all levels of the cervical
spine, most severe at C5-C6 and C6-C7.
IMPRESSION: 1. Prior C4-C5 anterior and posterior fusion.
2. Osteoarthritic changes of the cervical spine, moderate to severe
at C5-C6 and C6-C7.

## 2019-08-17 ENCOUNTER — Other Ambulatory Visit (HOSPITAL_COMMUNITY): Payer: Self-pay | Admitting: Pulmonary Disease

## 2019-08-17 ENCOUNTER — Other Ambulatory Visit: Payer: Self-pay | Admitting: Pulmonary Disease

## 2019-08-17 DIAGNOSIS — M542 Cervicalgia: Secondary | ICD-10-CM

## 2019-08-25 ENCOUNTER — Other Ambulatory Visit: Payer: Self-pay

## 2019-08-25 ENCOUNTER — Ambulatory Visit (HOSPITAL_COMMUNITY)
Admission: RE | Admit: 2019-08-25 | Discharge: 2019-08-25 | Disposition: A | Discharge status: Home / Self Care | Payer: PPO | Source: Ambulatory Visit | Attending: Pulmonary Disease | Admitting: Pulmonary Disease

## 2019-08-25 ENCOUNTER — Ambulatory Visit (HOSPITAL_COMMUNITY): Payer: PPO

## 2019-08-25 DIAGNOSIS — M542 Cervicalgia: Secondary | ICD-10-CM | POA: Diagnosis not present

## 2019-08-25 DIAGNOSIS — M47812 Spondylosis without myelopathy or radiculopathy, cervical region: Secondary | ICD-10-CM | POA: Diagnosis not present

## 2019-08-25 DIAGNOSIS — M4802 Spinal stenosis, cervical region: Secondary | ICD-10-CM | POA: Diagnosis not present

## 2019-08-25 DIAGNOSIS — M50223 Other cervical disc displacement at C6-C7 level: Secondary | ICD-10-CM | POA: Diagnosis not present

## 2019-08-25 IMAGING — MR MR CERVICAL SPINE W/O CM
4 of 5 series · 21 of 48 positions shown · non-contrast
Comparison: None.

CLINICAL DATA: Neck, right arm and right leg pain for 1 month.
History of cervical spine surgery in [[facility]].

EXAM:
MRI CERVICAL SPINE WITHOUT CONTRAST
TECHNIQUE: Multiplanar, multisequence MR imaging of the cervical spine was
performed. No intravenous contrast was administered.

[Series 3: T2 · sagittal · 3.0mm · 0.60mm/px · 7 of 17 slices shown (1 of 2)]
[im 1/17]
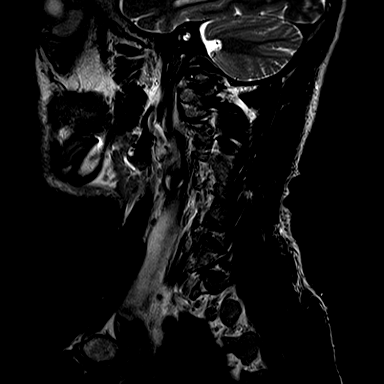
[im 3/17]
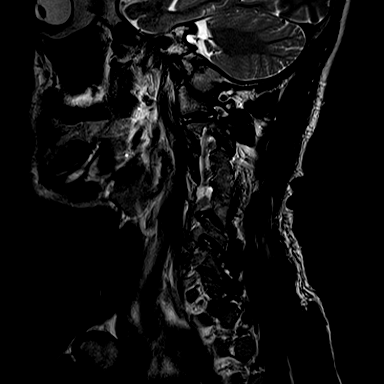
[im 6/17]
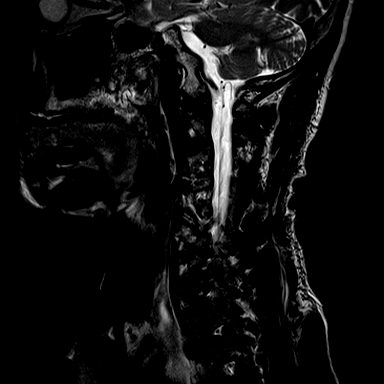
[im 9/17]
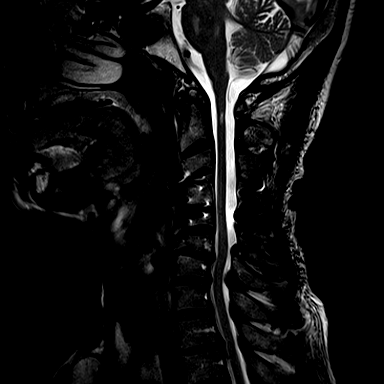
[im 11/17]
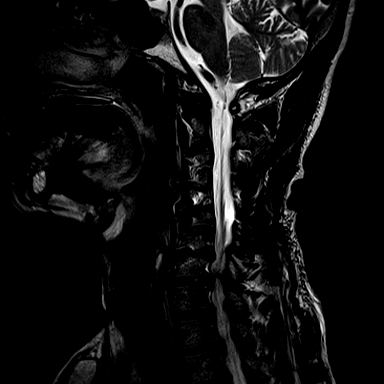
[im 14/17]
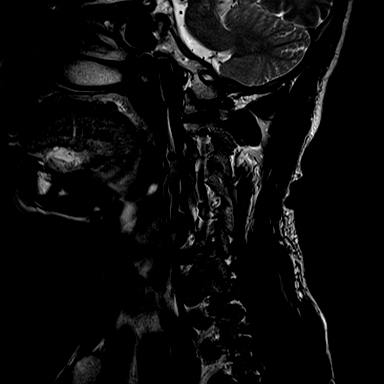
[im 17/17]
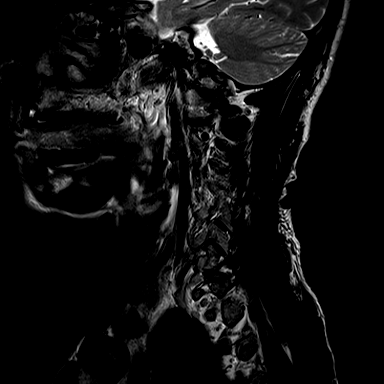

[Series 4: FLAIR · sagittal · 3.0mm · 0.71mm/px · 3 of 17 slices shown]
[im 3/17]
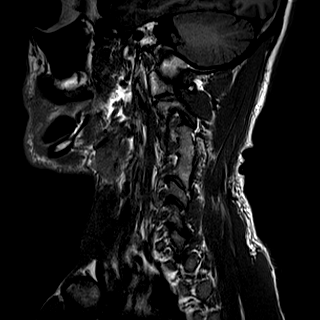
[im 9/17]
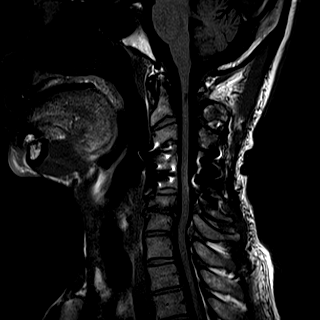
[im 14/17]
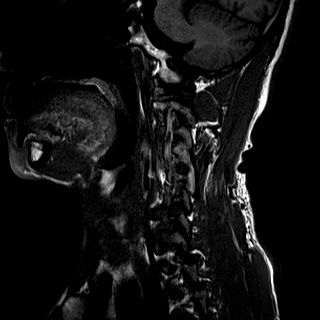

[Series 6: T2 · axial · 3.0mm · 0.30mm/px · z∈[-42,+66]mm · 8 of 34 slices shown (2 of 2)]
[im 1/34]
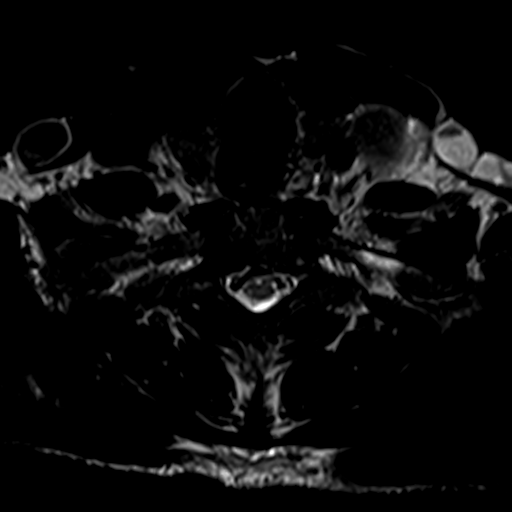
[im 6/34]
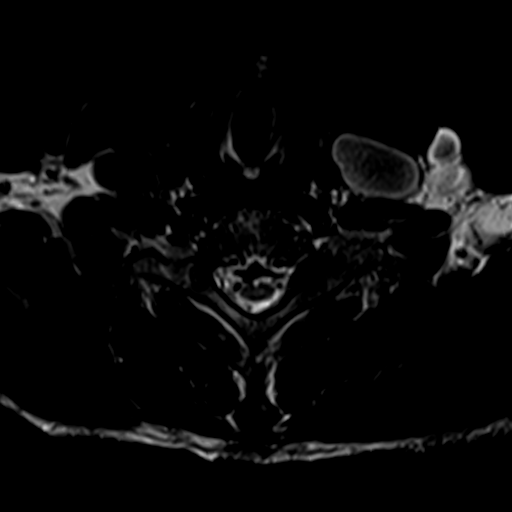
[im 11/34]
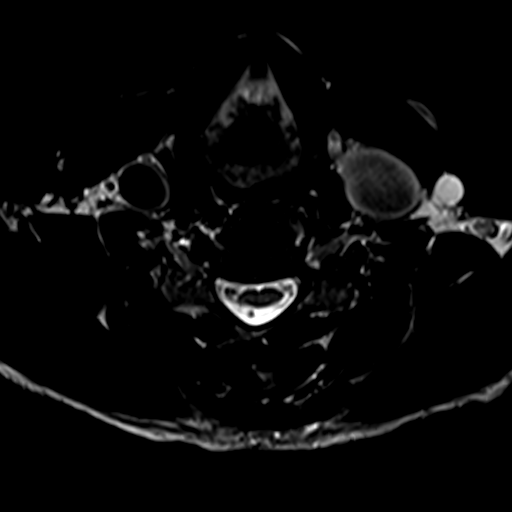
[im 16/34]
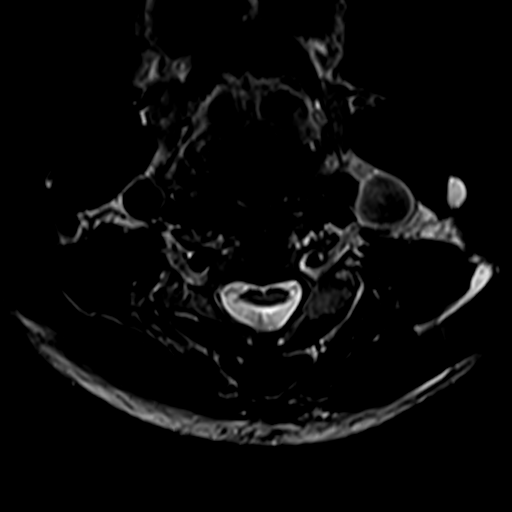
[im 18/34]
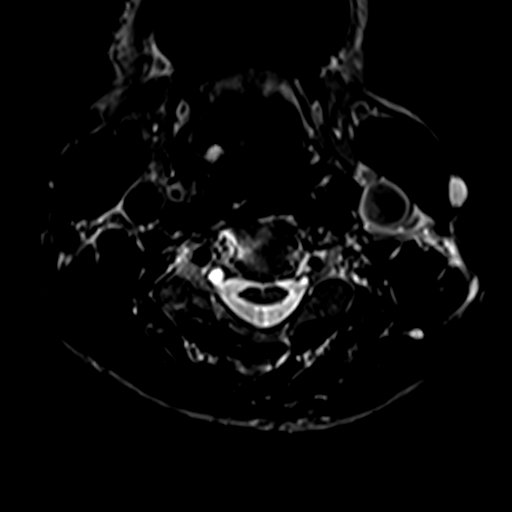
[im 23/34]
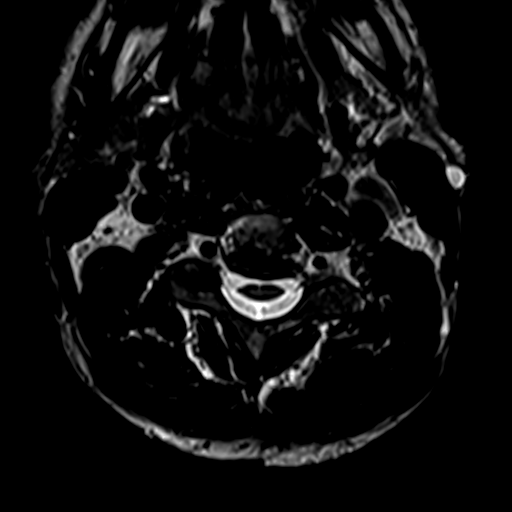
[im 28/34]
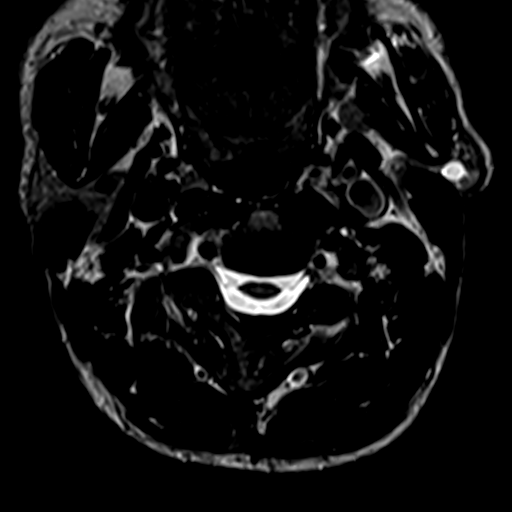
[im 34/34]
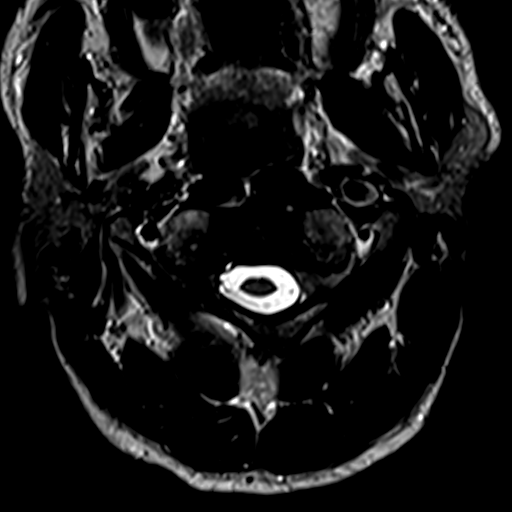

[Series 8: STIR · sagittal · 3.0mm · 0.36mm/px · 3 of 15 slices shown]
[im 3/15]
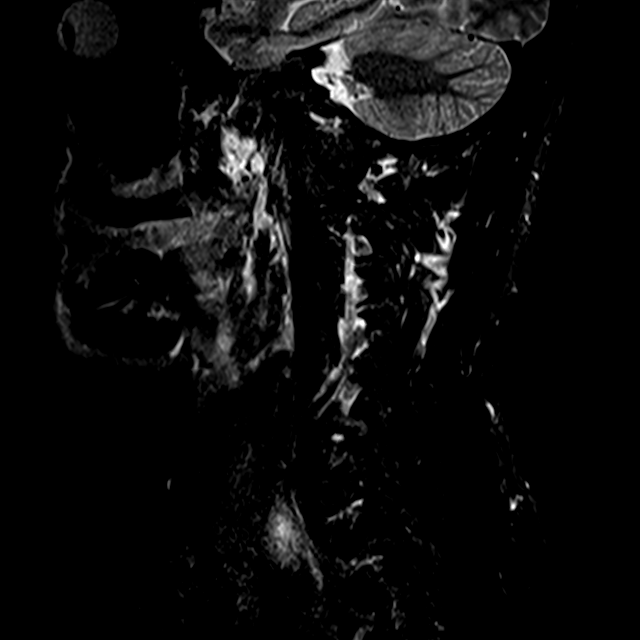
[im 9/15]
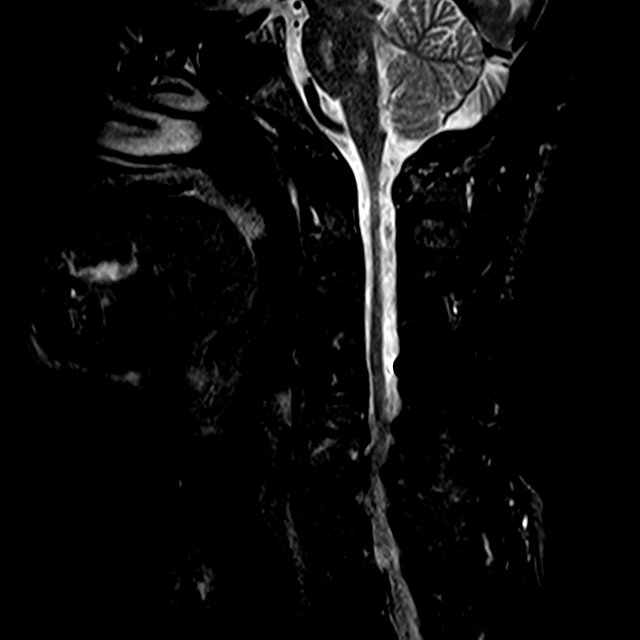
[im 15/15]
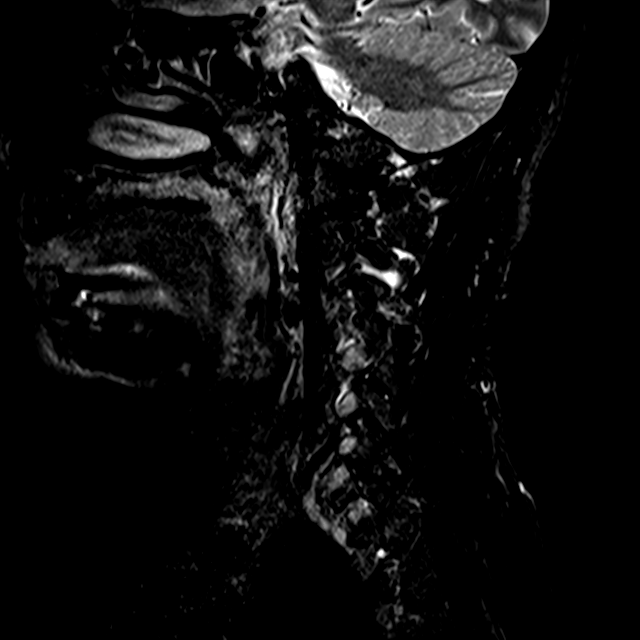

[21 of 48 positions shown; findings below may reference images not displayed]

FINDINGS: Alignment: There is mild reversal of the normal cervical lordosis.

Vertebrae: No fracture, evidence of discitis, or bone lesion. The
patient is status post C4-5 anterior and posterior fusion.
Degenerative endplate signal change C6-7 noted.

Cord: There is signal abnormality within the cervical cord posterior
to the dens consistent with myelomalacia, likely secondary to remote
ischemia. The cord appears somewhat thinned throughout.

Posterior Fossa, vertebral arteries, paraspinal tissues: Patchy
increased signal in the pons is most consistent with chronic
microvascular ischemic change.

Disc levels:

C2-3: Negative.

C3-4: No stenosis. No disc bulge or protrusion. The facet joints are
ankylosed bilaterally.

C4-5: Status post anterior and posterior fusion with solid bridging
bone across the disc interspace and facets. No stenosis.

C5-6: Minimal disc bulge and mild uncovertebral disease. The central
canal is open. Mild bilateral foraminal narrowing is more notable on
the right.

C6-7: There is a disc bulge and left worse than right uncovertebral
disease. The ventral thecal sac is effaced. Moderate to moderately
severe foraminal narrowing is worse on the left.

C6-7: Bulky ligamentum flavum thickening and a broad-based central
protrusion. There is moderate to moderately severe central canal
stenosis and moderately severe bilateral foraminal narrowing, worse
on the right.

T1-2 and T2-3 are imaged in the sagittal plane only. Broad-based
disc protrusion at T2-3 appear to slightly deforms the ventral cord.
There is a shallow bulge at T1-2.
IMPRESSION: The cord appears somewhat thinned throughout compatible with
atrophy. T2 hyperintensity in the cord posterior to C2 is consistent
with chronic myelomalacia and presumably related to remote ischemia.

Spondylosis appears worst at C4-5 where bulky ligamentum flavum
thickening, a broad-based central protrusion and uncovertebral
disease cause moderate to moderately severe central canal stenosis
and moderately severe bilateral foraminal narrowing.

Disc bulge at C6-7 effaces the ventral thecal sac. Moderate to
moderately severe foraminal narrowing at this level is worse on the
left.

Status post C4-5 fusion. The central canal and foramina are widely
patent.

## 2019-09-06 ENCOUNTER — Other Ambulatory Visit: Payer: Self-pay | Admitting: Pulmonary Disease

## 2019-09-06 DIAGNOSIS — M542 Cervicalgia: Secondary | ICD-10-CM

## 2019-09-12 ENCOUNTER — Ambulatory Visit
Admission: RE | Admit: 2019-09-12 | Discharge: 2019-09-12 | Disposition: A | Discharge status: Home / Self Care | Payer: PPO | Source: Ambulatory Visit | Attending: Pulmonary Disease | Admitting: Pulmonary Disease

## 2019-09-12 DIAGNOSIS — M542 Cervicalgia: Secondary | ICD-10-CM

## 2019-09-12 DIAGNOSIS — M4803 Spinal stenosis, cervicothoracic region: Secondary | ICD-10-CM | POA: Diagnosis not present

## 2019-09-12 IMAGING — XA DG INJECT/[PERSON_NAME] INC NEEDLE/CATH/PLC EPI/CERV/THOR W/IMG
2 series · 2 of 2 positions shown · non-contrast
Comparison: none

CLINICAL DATA: Cervical spondylosis without myelopathy. Right-sided
neck and right arm pain. Previous anterior and posterior fusion at
C4-5. Moderate to advanced spinal stenosis at C6-7 and C7-T1. Given
the degree of spinal stenosis at these levels, today's injection
will be performed at T1-2.

[Series 1: ortho standard · 1 of 1 slices shown (1 of 2)]
[im 1/1]
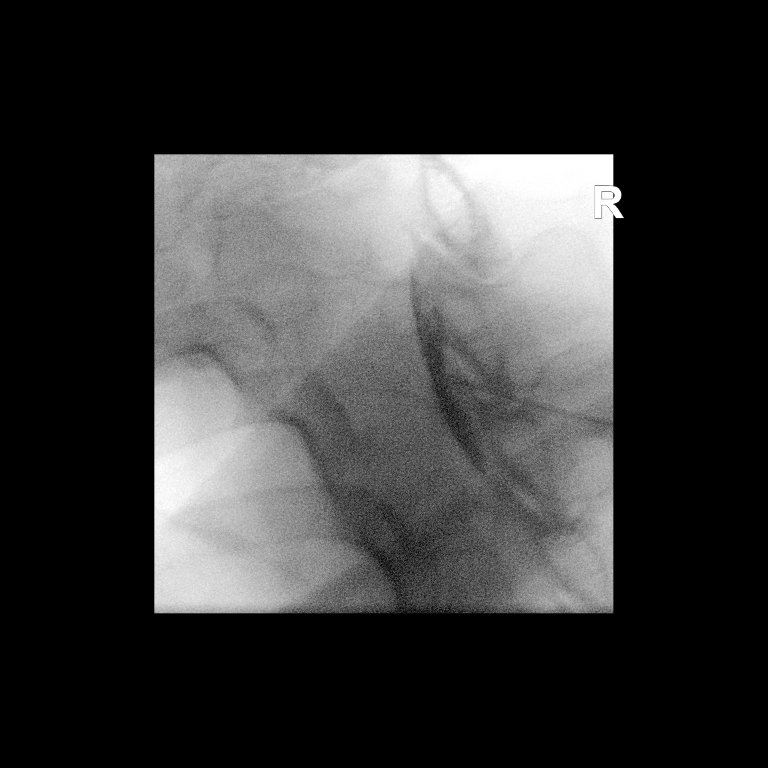

[Series 2: ortho standard · 1 of 1 slices shown (2 of 2)]
[im 1/1]
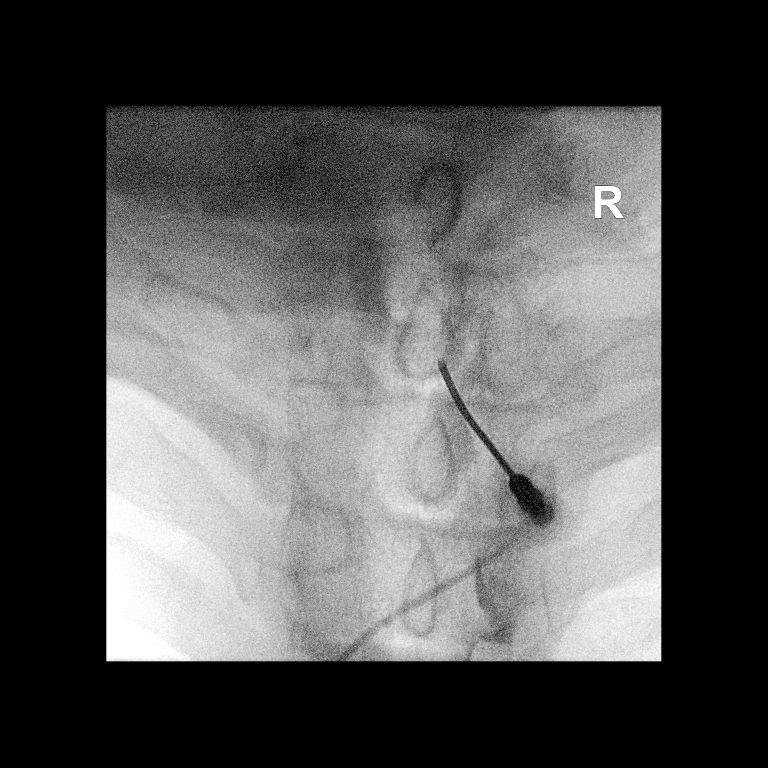

[2 of 2 positions shown; findings below may reference images not displayed]

FLUOROSCOPY TIME:  Fluoroscopy Time: 15 seconds

Radiation Exposure Index: 14.33 microGray*m^2

PROCEDURE:
The procedure, risks, benefits, and alternatives were explained to
the patient. Questions regarding the procedure were encouraged and
answered. The patient understands and consents to the procedure.

THORACIC EPIDURAL INJECTION

An interlaminar approach was performed on the right at T1-2. A
inch 20 gauge epidural needle was advanced using loss-of-resistance
technique.

DIAGNOSTIC EPIDURAL INJECTION

Injection of Isovue-M 300 shows a good epidural pattern with spread
above and below the level of needle placement, primarily on the
right. No vascular opacification is seen.

THERAPEUTIC EPIDURAL INJECTION

1.5 ml of Kenalog 40 mixed with 2 ml of normal saline were then
instilled. The procedure was well-tolerated, and the patient was
discharged thirty minutes following the injection in good condition.
IMPRESSION: Technically successful interlaminar epidural injection on the right
at T1-2.

## 2019-09-12 MED ORDER — TRIAMCINOLONE ACETONIDE 40 MG/ML IJ SUSP (RADIOLOGY): 60.0000 mg | Freq: Once | INTRAMUSCULAR | Status: DC

## 2019-09-12 MED ORDER — IOPAMIDOL (ISOVUE-M 300) INJECTION 61%: 1.0000 mL | Freq: Once | INTRAMUSCULAR | Status: DC | PRN

## 2019-10-10 ENCOUNTER — Other Ambulatory Visit: Payer: Self-pay | Admitting: *Deleted

## 2019-10-10 DIAGNOSIS — Z20822 Contact with and (suspected) exposure to covid-19: Secondary | ICD-10-CM

## 2019-10-12 LAB — NOVEL CORONAVIRUS, NAA: SARS-CoV-2, NAA: NOT DETECTED

## 2019-11-13 DIAGNOSIS — K2100 Gastro-esophageal reflux disease with esophagitis, without bleeding: Secondary | ICD-10-CM | POA: Diagnosis not present

## 2019-11-13 DIAGNOSIS — E785 Hyperlipidemia, unspecified: Secondary | ICD-10-CM | POA: Diagnosis not present

## 2019-11-13 DIAGNOSIS — N401 Enlarged prostate with lower urinary tract symptoms: Secondary | ICD-10-CM | POA: Diagnosis not present

## 2019-11-19 DIAGNOSIS — E785 Hyperlipidemia, unspecified: Secondary | ICD-10-CM | POA: Insufficient documentation

## 2019-11-19 DIAGNOSIS — N410 Acute prostatitis: Secondary | ICD-10-CM

## 2019-11-19 DIAGNOSIS — J019 Acute sinusitis, unspecified: Secondary | ICD-10-CM | POA: Insufficient documentation

## 2019-11-19 DIAGNOSIS — K432 Incisional hernia without obstruction or gangrene: Secondary | ICD-10-CM | POA: Insufficient documentation

## 2019-11-19 DIAGNOSIS — N138 Other obstructive and reflux uropathy: Secondary | ICD-10-CM | POA: Insufficient documentation

## 2019-11-19 DIAGNOSIS — K5904 Chronic idiopathic constipation: Secondary | ICD-10-CM | POA: Insufficient documentation

## 2019-11-19 DIAGNOSIS — R7401 Elevation of levels of liver transaminase levels: Secondary | ICD-10-CM | POA: Insufficient documentation

## 2019-11-19 DIAGNOSIS — N528 Other male erectile dysfunction: Secondary | ICD-10-CM | POA: Insufficient documentation

## 2019-11-19 DIAGNOSIS — M5416 Radiculopathy, lumbar region: Secondary | ICD-10-CM | POA: Insufficient documentation

## 2019-11-19 DIAGNOSIS — R21 Rash and other nonspecific skin eruption: Secondary | ICD-10-CM | POA: Insufficient documentation

## 2019-11-19 DIAGNOSIS — R7402 Elevation of levels of lactic acid dehydrogenase (LDH): Secondary | ICD-10-CM

## 2019-12-12 ENCOUNTER — Telehealth: Payer: Self-pay | Admitting: Family Medicine

## 2019-12-12 NOTE — Telephone Encounter (Signed)
Pt states he might of pulled a muscle in his left leg. His new pt appointment is in march. He wants to know if he can be seen for this before hand or if he needs to go to urgent care.

## 2019-12-12 NOTE — Telephone Encounter (Signed)
Urgent Care

## 2019-12-12 NOTE — Telephone Encounter (Signed)
Routing to Dr. Corum for advice ? 

## 2019-12-13 NOTE — Telephone Encounter (Signed)
Daniel Duffy, I tried to reach him to let him know but he didn't answer and I could not leave a  message on voicemail

## 2019-12-25 ENCOUNTER — Other Ambulatory Visit: Payer: Self-pay

## 2019-12-25 ENCOUNTER — Ambulatory Visit: Payer: PPO | Attending: Internal Medicine

## 2019-12-25 DIAGNOSIS — Z20822 Contact with and (suspected) exposure to covid-19: Secondary | ICD-10-CM

## 2019-12-26 LAB — NOVEL CORONAVIRUS, NAA: SARS-CoV-2, NAA: NOT DETECTED

## 2019-12-28 ENCOUNTER — Other Ambulatory Visit: Payer: Self-pay

## 2019-12-28 ENCOUNTER — Ambulatory Visit
Admission: EM | Admit: 2019-12-28 | Discharge: 2019-12-28 | Disposition: A | Discharge status: Home / Self Care | Payer: PPO | Attending: Emergency Medicine | Admitting: Emergency Medicine

## 2019-12-28 ENCOUNTER — Ambulatory Visit (HOSPITAL_COMMUNITY)
Admission: RE | Admit: 2019-12-28 | Discharge: 2019-12-28 | Disposition: A | Discharge status: Home / Self Care | Payer: PPO | Source: Ambulatory Visit | Attending: Emergency Medicine | Admitting: Emergency Medicine

## 2019-12-28 DIAGNOSIS — M79662 Pain in left lower leg: Secondary | ICD-10-CM

## 2019-12-28 DIAGNOSIS — M79609 Pain in unspecified limb: Secondary | ICD-10-CM | POA: Diagnosis not present

## 2019-12-28 NOTE — Discharge Instructions (Addendum)
Advised patient to to complete an ultrasound exam of the left calf Apt set for 3 pm entrance C off [address] Follow-up with Maryruth Hancock, MD Return or go to ED for worsening of symptoms

## 2019-12-28 NOTE — ED Provider Notes (Signed)
[facility]    CSN: [id number] Arrival date & time: 12/28/19  [id number]      History   Chief Complaint Chief Complaint  Patient presents with  . Tingling    left leg    HPI Daniel Duffy is a 69 y.o. male.   General [name] 69 years old male presented to the urgent care with a complaint of tingling and pain in the left calf for the past 2 weeks.  Denies a precipitating event, or specific injury.  Patient localizes pain and tingling to the left calf.  Describes it as constant, achy and tingling in character.  Has tried OTC medications without relief.  Symptoms are made worse with walking.  Denies similar symptoms in the past.  Denies fever, chills, appetite change, weight change, chest pain, nausea, vomiting, changes in bowel or bladder habits.      Past Medical History:  Diagnosis Date  . Abnormal weight loss   . Acid reflux   . Acute prostatitis   . Acute sinusitis, unspecified   . Chronic idiopathic constipation   . Colon polyps   . Enlarged prostate   . Hyperlipidemia, unspecified   . Incisional hernia without obstruction or gangrene   . Lumbago   . Nonspecific elevation of levels of transaminase and lactic acid dehydrogenase (LDH)   . Other male erectile dysfunction   . Other obstructive and reflux uropathy   . Radiculopathy, lumbar region   . Rash and other nonspecific skin eruption   . Reflux esophagitis     Patient Active Problem List   Diagnosis Date Noted  . Hyperlipidemia, unspecified   . Incisional hernia without obstruction or gangrene   . Other male erectile dysfunction   . Acute prostatitis   . Acute sinusitis, unspecified   . Chronic idiopathic constipation   . Nonspecific elevation of levels of transaminase and lactic acid dehydrogenase (LDH)   . Other obstructive and reflux uropathy   . Radiculopathy, lumbar region   . Rash and other nonspecific skin eruption   . Abdominal pain, epigastric 08/10/2017  . Abnormal weight loss  08/10/2017  . Elevated LFTs 08/10/2017  . Family history of colon cancer 03/23/2013  . Constipation 03/23/2013    Past Surgical History:  Procedure Laterality Date  . COLONOSCOPY  12/29/2005   [id number] hemorrhoids, anal papilla/Diminutive polyp on a stalk at 10 cm/otherwise normal rectum and colon, PATH: hyperplastic polyp  . COLONOSCOPY N/A 04/10/2013   RMR: tubular adenoma: next TCS 04/2018  . COLONOSCOPY WITH PROPOFOL N/A 02/10/2018   Procedure: COLONOSCOPY WITH PROPOFOL;  Surgeon: Daneil Dolin, MD;  [facility];  Service: Endoscopy;  Laterality: N/A;  . ESOPHAGOGASTRODUODENOSCOPY  12/29/2005   [id number] Schatzki's ring, small hiatal hernia, normal gastric mucosa, normal D1 and D2.   . HAND SURGERY     bilateral, got caught in a machine, two different occasions, right hand originally, than left hand. Left hand with skin graft  . SPINAL CORD DECOMPRESSION         Home Medications    Prior to Admission medications   Medication Sig Start Date End Date Taking? Authorizing Provider  Chlorphen-Phenyleph-ASA (ALKA-SELTZER PLUS COLD PO) Take 2 tablets by mouth daily as needed (cold symptoms).    [provider]  linaclotide Rolan Lipa) 145 MCG CAPS capsule Take 1 capsule (145 mcg total) by mouth daily before breakfast. Patient not taking: Reported on 01/18/2018 09/29/17   Mahala Menghini, PA-C  Multiple Vitamins-Minerals (MULTIVITAMINS THER. W/MINERALS) TABS Take  1 tablet by mouth daily.    [provider]  naproxen sodium (ALEVE) 220 MG tablet Take 220 mg by mouth daily as needed (pain).    [provider]  omeprazole (PRILOSEC) 20 MG capsule Take 1 capsule (20 mg total) by mouth daily. 03/23/13   Annitta Needs, NP  polyethylene glycol [facility] / Floria Raveling) packet Take 17 g by mouth every other day.    [provider]  tamsulosin (FLOMAX) 0.4 MG CAPS capsule Take 0.4 mg by mouth daily after breakfast.    [provider]    triamcinolone (KENALOG) 0.025 % cream Apply 1 application topically 2 (two) times daily.    [provider]    Family History Family History  Problem Relation Age of Onset  . Colon cancer Brother        42s  . Healthy Mother   . Healthy Father     Social History Social History   Tobacco Use  . Smoking status: Never Smoker  . Smokeless tobacco: Never Used  Substance Use Topics  . Alcohol use: No  . Drug use: No     Allergies   Patient has no known allergies.   Review of Systems Review of Systems  Constitutional: Negative.   Respiratory: Negative.   Cardiovascular: Negative.   Musculoskeletal:       Left calf muscle pain  Neurological: Negative.      Physical Exam Triage Vital Signs ED Triage Vitals [12/28/19 0951]  Enc Vitals Group     BP      Pulse      Resp      Temp      Temp src      SpO2      Weight      Height      Head Circumference      Peak Flow      Pain Score 8     Pain Loc      Pain Edu?      Excl. in [place]?    No data found.  Updated Vital Signs There were no vitals taken for this visit.  Visual Acuity Right Eye Distance:   Left Eye Distance:   Bilateral Distance:    Right Eye Near:   Left Eye Near:    Bilateral Near:     Physical Exam Constitutional:      General: He is not in acute distress.    Appearance: Normal appearance. He is normal weight. He is not ill-appearing or toxic-appearing.  Cardiovascular:     Rate and Rhythm: Normal rate and regular rhythm.     Pulses: Normal pulses.     Heart sounds: Normal heart sounds.  Pulmonary:     Effort: Pulmonary effort is normal. No respiratory distress.     Breath sounds: Normal breath sounds. No wheezing or rales.  Chest:     Chest wall: No tenderness.  Musculoskeletal:        General: No swelling or signs of injury. Normal range of motion.     Right lower leg: Normal. No edema.     Left lower leg: Normal. No swelling, deformity, lacerations or tenderness. No  edema.  Neurological:     General: No focal deficit present.     Mental Status: He is alert and oriented to person, place, and time. Mental status is at baseline.     Cranial Nerves: No cranial nerve deficit.     Sensory: No sensory deficit.  Motor: No weakness.      UC Treatments / Results  Labs (all labs ordered are listed, but only abnormal results are displayed) Labs Reviewed - No data to display  EKG   Radiology No results found.  Procedures Procedures (including critical care time)  Medications Ordered in UC Medications - No data to display  Initial Impression / Assessment and Plan / UC Course  I have reviewed the triage vital signs and the nursing notes.  Pertinent labs & imaging results that were available during my care of the patient were reviewed by me and considered in my medical decision making (see chart for details).    Patient stable for discharge Ultrasound of the left calf was ordered Advised patient to go to [facility] to have it complete To follow up with primary care Patient verbalized understanding the plan of care  Final Clinical Impressions(s) / UC Diagnoses   Final diagnoses:  Pain of left calf     Discharge Instructions     Advised patient to to complete an ultrasound exam of the left calf Apt set for 3 pm entrance C off [address] Follow-up with Maryruth Hancock, MD Return or go to ED for worsening of symptoms     ED Prescriptions    None     PDMP not reviewed this encounter.   Emerson Monte, FNP 12/28/19 1026

## 2019-12-28 NOTE — Progress Notes (Signed)
Lower extremity venous has been completed.   Preliminary results in CV Proc.   Abram Sander 12/28/2019 3:15 PM

## 2019-12-28 NOTE — Telephone Encounter (Signed)
Pt called in today and informed him.

## 2019-12-28 NOTE — ED Triage Notes (Signed)
Pt presents to UC w/ c/o tingling/ pain in left lower leg x2 weeks. Pt states he is not diabetic.

## 2020-02-26 ENCOUNTER — Ambulatory Visit: Payer: PPO | Admitting: Family Medicine

## 2020-03-04 DIAGNOSIS — Z0001 Encounter for general adult medical examination with abnormal findings: Secondary | ICD-10-CM | POA: Diagnosis not present

## 2020-03-04 DIAGNOSIS — Z681 Body mass index (BMI) 19 or less, adult: Secondary | ICD-10-CM | POA: Diagnosis not present

## 2020-03-04 DIAGNOSIS — M5416 Radiculopathy, lumbar region: Secondary | ICD-10-CM | POA: Diagnosis not present

## 2020-04-29 DIAGNOSIS — Z0001 Encounter for general adult medical examination with abnormal findings: Secondary | ICD-10-CM | POA: Diagnosis not present

## 2020-04-29 DIAGNOSIS — Z0000 Encounter for general adult medical examination without abnormal findings: Secondary | ICD-10-CM | POA: Diagnosis not present

## 2020-04-29 DIAGNOSIS — Z13220 Encounter for screening for lipoid disorders: Secondary | ICD-10-CM | POA: Diagnosis not present

## 2020-04-29 DIAGNOSIS — M5416 Radiculopathy, lumbar region: Secondary | ICD-10-CM | POA: Diagnosis not present

## 2020-07-01 DIAGNOSIS — Z6822 Body mass index (BMI) 22.0-22.9, adult: Secondary | ICD-10-CM | POA: Diagnosis not present

## 2020-07-01 DIAGNOSIS — J019 Acute sinusitis, unspecified: Secondary | ICD-10-CM | POA: Diagnosis not present

## 2020-07-01 DIAGNOSIS — R519 Headache, unspecified: Secondary | ICD-10-CM | POA: Diagnosis not present

## 2020-07-10 DIAGNOSIS — J3489 Other specified disorders of nose and nasal sinuses: Secondary | ICD-10-CM | POA: Diagnosis not present

## 2020-07-10 DIAGNOSIS — J019 Acute sinusitis, unspecified: Secondary | ICD-10-CM | POA: Diagnosis not present

## 2020-07-10 DIAGNOSIS — Z6822 Body mass index (BMI) 22.0-22.9, adult: Secondary | ICD-10-CM | POA: Diagnosis not present

## 2020-12-12 ENCOUNTER — Emergency Department (HOSPITAL_COMMUNITY)
Admission: EM | Admit: 2020-12-12 | Discharge: 2020-12-12 | Disposition: A | Discharge status: Home / Self Care | Payer: PPO | Attending: Emergency Medicine | Admitting: Emergency Medicine

## 2020-12-12 ENCOUNTER — Other Ambulatory Visit: Payer: Self-pay

## 2020-12-12 ENCOUNTER — Encounter (HOSPITAL_COMMUNITY): Payer: Self-pay | Admitting: Emergency Medicine

## 2020-12-12 DIAGNOSIS — R0789 Other chest pain: Secondary | ICD-10-CM | POA: Insufficient documentation

## 2020-12-12 DIAGNOSIS — Z5321 Procedure and treatment not carried out due to patient leaving prior to being seen by health care provider: Secondary | ICD-10-CM | POA: Diagnosis not present

## 2020-12-12 NOTE — ED Triage Notes (Signed)
Pt c/o non-radiating chest pain described as tightness that began this afternoon.

## 2020-12-16 ENCOUNTER — Encounter (HOSPITAL_COMMUNITY): Payer: Self-pay

## 2020-12-16 ENCOUNTER — Other Ambulatory Visit: Payer: Self-pay

## 2020-12-16 ENCOUNTER — Emergency Department (HOSPITAL_COMMUNITY)
Admission: EM | Admit: 2020-12-16 | Discharge: 2020-12-16 | Disposition: A | Discharge status: Home / Self Care | Payer: PPO | Attending: Emergency Medicine | Admitting: Emergency Medicine

## 2020-12-16 ENCOUNTER — Emergency Department (HOSPITAL_COMMUNITY): Payer: PPO

## 2020-12-16 DIAGNOSIS — R0789 Other chest pain: Secondary | ICD-10-CM | POA: Diagnosis not present

## 2020-12-16 DIAGNOSIS — K859 Acute pancreatitis without necrosis or infection, unspecified: Secondary | ICD-10-CM | POA: Insufficient documentation

## 2020-12-16 DIAGNOSIS — Z87891 Personal history of nicotine dependence: Secondary | ICD-10-CM | POA: Insufficient documentation

## 2020-12-16 DIAGNOSIS — R079 Chest pain, unspecified: Secondary | ICD-10-CM | POA: Diagnosis not present

## 2020-12-16 DIAGNOSIS — I309 Acute pericarditis, unspecified: Secondary | ICD-10-CM | POA: Diagnosis not present

## 2020-12-16 DIAGNOSIS — Z20822 Contact with and (suspected) exposure to covid-19: Secondary | ICD-10-CM | POA: Insufficient documentation

## 2020-12-16 DIAGNOSIS — Z1331 Encounter for screening for depression: Secondary | ICD-10-CM | POA: Diagnosis not present

## 2020-12-16 DIAGNOSIS — Z6822 Body mass index (BMI) 22.0-22.9, adult: Secondary | ICD-10-CM | POA: Diagnosis not present

## 2020-12-16 LAB — BASIC METABOLIC PANEL
Anion gap: 8 (ref 5–15)
BUN: 13 mg/dL (ref 8–23)
CO2: 23 mmol/L (ref 22–32)
Calcium: 10.7 mg/dL — ABNORMAL HIGH (ref 8.9–10.3)
Chloride: 105 mmol/L (ref 98–111)
Creatinine, Ser: 1.13 mg/dL (ref 0.61–1.24)
GFR, Estimated: >60 mL/min (ref >60)
Glucose, Bld: 109 mg/dL — ABNORMAL HIGH (ref 70–99)
Potassium: 4.6 mmol/L (ref 3.5–5.1)
Sodium: 136 mmol/L (ref 135–145)

## 2020-12-16 LAB — CBC
HCT: 45.8 % (ref 39.0–52.0)
Hemoglobin: 14.6 g/dL (ref 13.0–17.0)
MCH: 28.1 pg (ref 26.0–34.0)
MCHC: 31.9 g/dL (ref 30.0–36.0)
MCV: 88.1 fL (ref 80.0–100.0)
Platelets: 168 10*3/uL (ref 150–400)
RBC: 5.2 MIL/uL (ref 4.22–5.81)
RDW: 13.5 % (ref 11.5–15.5)
WBC: 5.2 10*3/uL (ref 4.0–10.5)
nRBC: 0 % (ref 0.0–0.2)

## 2020-12-16 LAB — D-DIMER, QUANTITATIVE: D-Dimer, Quant: 0.34 ug/mL-FEU (ref 0.00–0.50)

## 2020-12-16 LAB — TROPONIN I (HIGH SENSITIVITY)
Troponin I (High Sensitivity): 2 ng/L (ref <18)
Troponin I (High Sensitivity): <2 ng/L (ref <18)

## 2020-12-16 IMAGING — DX DG CHEST 1V PORT
1 series · 1 of 1 positions shown · non-contrast
Comparison: [DATE]

CLINICAL DATA: Pt presents to ED via [facility] for left sided chest
pain started [REDACTED]. Pt sent from [facility] was given Nitro x 1 and
baby ASA PTA

EXAM:
PORTABLE CHEST - 1 VIEW

[chest ap]
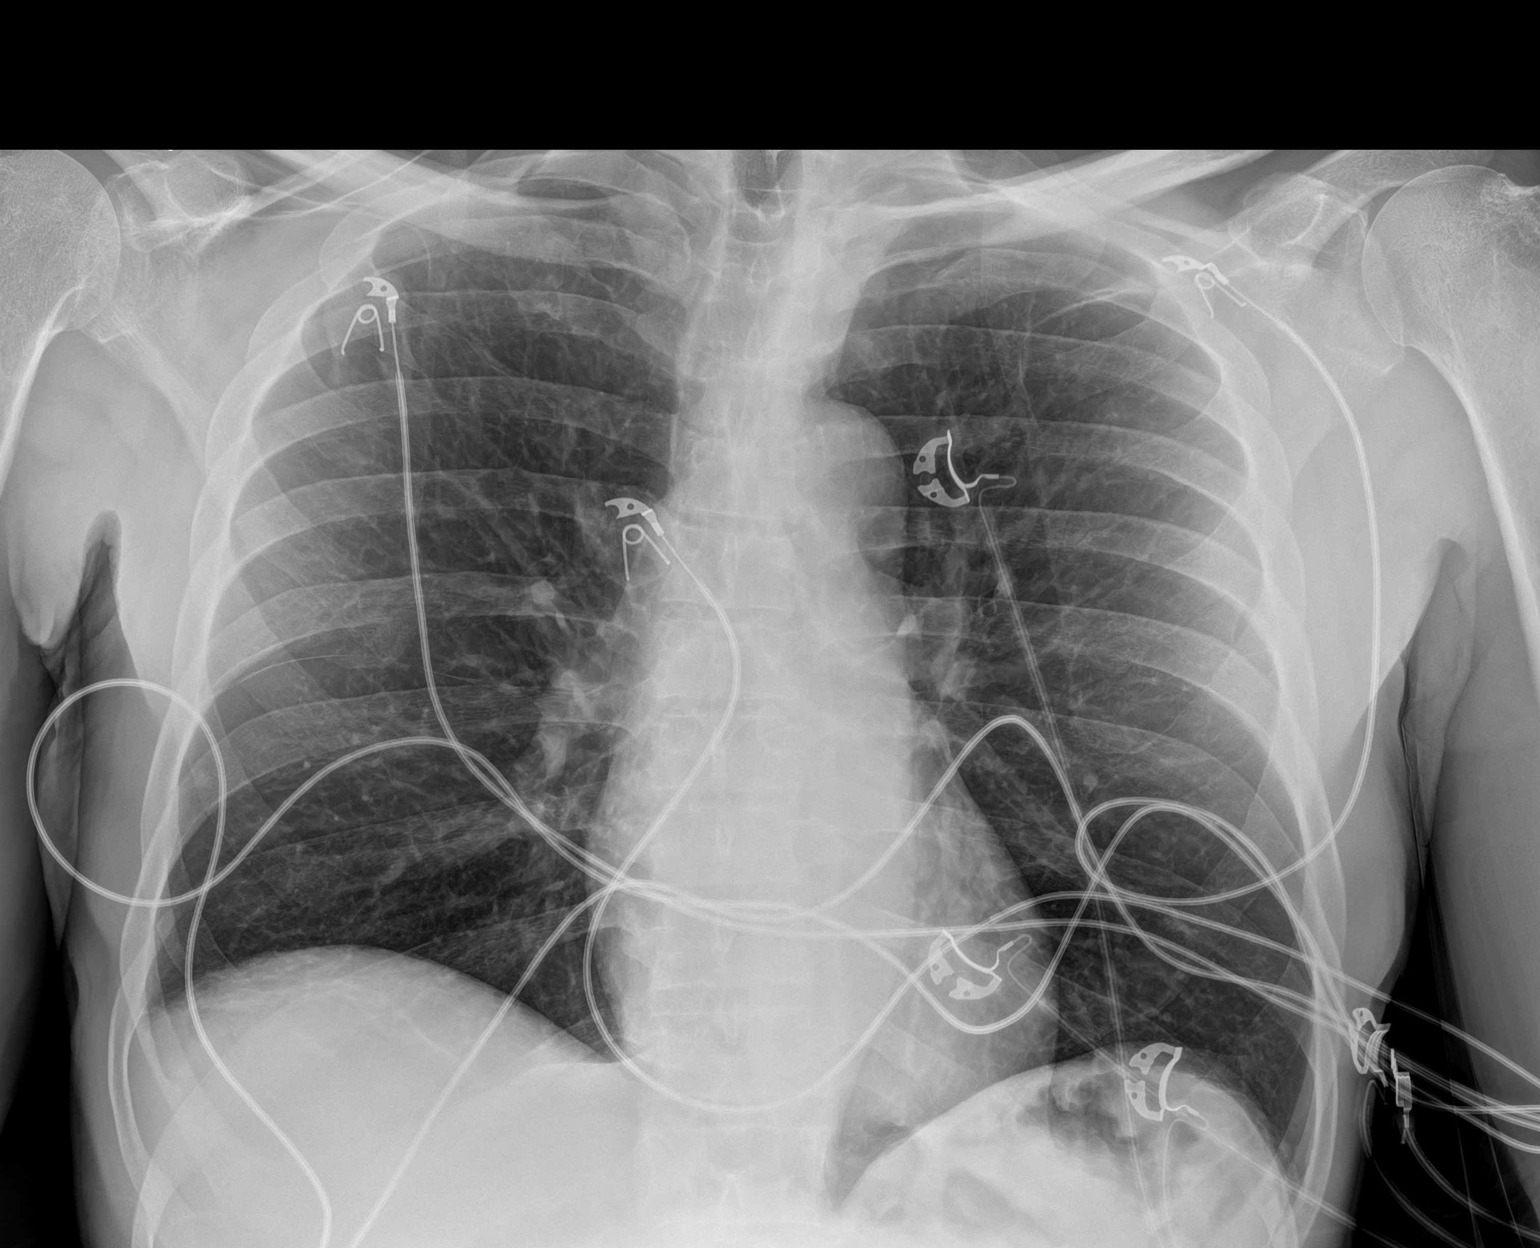

[1 of 1 positions shown; findings below may reference images not displayed]

FINDINGS: Lungs are clear.

Heart size and mediastinal contours are within normal limits.

No effusion.  No pneumothorax.

Cervical cerclage wires partially seen.  No acute bone abnormality.
IMPRESSION: No acute cardiopulmonary disease.

## 2020-12-16 MED ORDER — DEXAMETHASONE SODIUM PHOSPHATE 10 MG/ML IJ SOLN
10.0000 mg | Freq: Once | INTRAMUSCULAR | Status: AC
  Administered 2020-12-16: 10 mg via INTRAVENOUS
  Filled 2020-12-16: qty 1

## 2020-12-16 MED ORDER — KETOROLAC TROMETHAMINE 30 MG/ML IJ SOLN
30.0000 mg | Freq: Once | INTRAMUSCULAR | Status: AC
  Administered 2020-12-16: 30 mg via INTRAVENOUS
  Filled 2020-12-16: qty 1

## 2020-12-16 MED ORDER — PREDNISONE 10 MG (21) PO TBPK: ORAL_TABLET | ORAL | 0 refills | Status: DC

## 2020-12-16 NOTE — ED Triage Notes (Signed)
Pt presents to ED via [facility] for left sided chest pain started Thursday. Pt sent from [facility] was given Nitro x 1 and baby ASA PTA

## 2020-12-16 NOTE — ED Provider Notes (Signed)
[name] Provider Note   CSN: [id number] Arrival date & time: 12/16/20  1456     History Chief Complaint  Patient presents with  . Chest Pain    Daniel Duffy is a 70 y.o. male.  Pt presents to the ED today with cp.  Pt said cp started on Thursday, 1/6.  Pt did come to the ED on that day, but the wait was long and he left.  He went to his pcp today who sent him in.  Pt said the pain is more when he sits up.  He said it hurts to move.  He was given asa and 1 nitro by EMS and pain is gone.  Pt denies any sob or cough.  He's been fully covid vaccinated + booster.  He denies any leg pain or swelling.  He has never had anything like this in the past.        Past Medical History:  Diagnosis Date  . Abnormal weight loss   . Acid reflux   . Acute prostatitis   . Acute sinusitis, unspecified   . Chronic idiopathic constipation   . Colon polyps   . Enlarged prostate   . Hyperlipidemia, unspecified   . Incisional hernia without obstruction or gangrene   . Lumbago   . Nonspecific elevation of levels of transaminase and lactic acid dehydrogenase (LDH)   . Other male erectile dysfunction   . Other obstructive and reflux uropathy   . Radiculopathy, lumbar region   . Rash and other nonspecific skin eruption   . Reflux esophagitis     Patient Active Problem List   Diagnosis Date Noted  . Hyperlipidemia, unspecified   . Incisional hernia without obstruction or gangrene   . Other male erectile dysfunction   . Acute prostatitis   . Acute sinusitis, unspecified   . Chronic idiopathic constipation   . Nonspecific elevation of levels of transaminase and lactic acid dehydrogenase (LDH)   . Other obstructive and reflux uropathy   . Radiculopathy, lumbar region   . Rash and other nonspecific skin eruption   . Abdominal pain, epigastric 08/10/2017  . Abnormal weight loss 08/10/2017  . Elevated LFTs 08/10/2017  . Family history of colon cancer 03/23/2013  .  Constipation 03/23/2013    Past Surgical History:  Procedure Laterality Date  . COLONOSCOPY  12/29/2005   [id number] hemorrhoids, anal papilla/Diminutive polyp on a stalk at 10 cm/otherwise normal rectum and colon, PATH: hyperplastic polyp  . COLONOSCOPY N/A 04/10/2013   RMR: tubular adenoma: next TCS 04/2018  . COLONOSCOPY WITH PROPOFOL N/A 02/10/2018   Procedure: COLONOSCOPY WITH PROPOFOL;  Surgeon: Daneil Dolin, MD;  [facility];  Service: Endoscopy;  Laterality: N/A;  . ESOPHAGOGASTRODUODENOSCOPY  12/29/2005   [id number] Schatzki's ring, small hiatal hernia, normal gastric mucosa, normal D1 and D2.   . HAND SURGERY     bilateral, got caught in a machine, two different occasions, right hand originally, than left hand. Left hand with skin graft  . SPINAL CORD DECOMPRESSION         Family History  Problem Relation Age of Onset  . Colon cancer Brother        4s  . Healthy Mother   . Healthy Father     Social History   Tobacco Use  . Smoking status: Former Research scientist (life sciences)  . Smokeless tobacco: Never Used  Vaping Use  . Vaping Use: Never used  Substance Use Topics  . Alcohol use: No  .  Drug use: No    Home Medications Prior to Admission medications   Medication Sig Start Date End Date Taking? Authorizing Provider  predniSONE (STERAPRED UNI-PAK 21 TAB) 10 MG (21) TBPK tablet Take 6 tabs for 2 days, then 5 for 2 days, then 4 for 2 days, then 3 for 2 days, 2 for 2 days, then 1 for 2 days 12/16/20  Yes Isla Pence, MD  Chlorphen-Phenyleph-ASA (ALKA-SELTZER PLUS COLD PO) Take 2 tablets by mouth daily as needed (cold symptoms).    [provider]  linaclotide Rolan Lipa) 145 MCG CAPS capsule Take 1 capsule (145 mcg total) by mouth daily before breakfast. Patient not taking: Reported on 01/18/2018 09/29/17   Mahala Menghini, PA-C  Multiple Vitamins-Minerals (MULTIVITAMINS THER. W/MINERALS) TABS Take 1 tablet by mouth daily.    [provider]   naproxen sodium (ALEVE) 220 MG tablet Take 220 mg by mouth daily as needed (pain).    [provider]  omeprazole (PRILOSEC) 20 MG capsule Take 1 capsule (20 mg total) by mouth daily. 03/23/13   Annitta Needs, NP  polyethylene glycol [facility] / Floria Raveling) packet Take 17 g by mouth every other day.    [provider]  tamsulosin (FLOMAX) 0.4 MG CAPS capsule Take 0.4 mg by mouth daily after breakfast.    [provider]  triamcinolone (KENALOG) 0.025 % cream Apply 1 application topically 2 (two) times daily.    [provider]    Allergies    Patient has no known allergies.  Review of Systems   Review of Systems  Cardiovascular: Positive for chest pain.  All other systems reviewed and are negative.   Physical Exam Updated Vital Signs BP 113/77   Pulse 65   Temp 98.1 F (36.7 C) (Oral)   Resp 15   Ht 5\' 11"  (1.803 m)   Wt 70.8 kg   SpO2 100%   BMI 21.76 kg/m   Physical Exam Vitals and nursing note reviewed.  Constitutional:      Appearance: He is well-developed.  HENT:     Head: Normocephalic and atraumatic.  Eyes:     Extraocular Movements: Extraocular movements intact.     Pupils: Pupils are equal, round, and reactive to light.  Cardiovascular:     Rate and Rhythm: Normal rate and regular rhythm.     Heart sounds: Normal heart sounds.  Pulmonary:     Effort: Pulmonary effort is normal.     Breath sounds: Normal breath sounds.  Abdominal:     General: Bowel sounds are normal.     Palpations: Abdomen is soft.  Musculoskeletal:        General: Normal range of motion.     Cervical back: Normal range of motion and neck supple.     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     Capillary Refill: Capillary refill takes less than 2 seconds.  Neurological:     General: No focal deficit present.     Mental Status: He is alert and oriented  to person, place, and time.  Psychiatric:        Mood and Affect: Mood normal.        Behavior: Behavior normal.     ED Results / Procedures / Treatments   Labs (all labs ordered are listed, but only abnormal results are displayed) Labs Reviewed  BASIC METABOLIC PANEL - Abnormal;  Notable for the following components:      Result Value   Glucose, Bld 109 (*)    Calcium 10.7 (*)    All other components within normal limits  CBC  D-DIMER, QUANTITATIVE (NOT AT [facility])  TROPONIN I (HIGH SENSITIVITY)  TROPONIN I (HIGH SENSITIVITY)    EKG EKG Interpretation  Date/Time:  Monday December 16 2020 15:11:18 EST Ventricular Rate:  61 PR Interval:    QRS Duration: 87 QT Interval:  391 QTC Calculation: 394 R Axis:   84 Text Interpretation: Sinus rhythm Borderline right axis deviation ST elevation suggests acute pericarditis Confirmed by Isla Pence [phone]) on 12/16/2020 3:44:01 PM   Radiology DG Chest Portable 1 View  Result Date: 12/16/2020 CLINICAL DATA:  Pt presents to ED via [facility] for left sided chest pain started Thursday. Pt sent from [facility] was given Nitro x 1 and baby ASA PTA EXAM: PORTABLE CHEST - 1 VIEW COMPARISON:  06/11/2017 FINDINGS: Lungs are clear. Heart size and mediastinal contours are within normal limits. No effusion.  No pneumothorax. Cervical cerclage wires partially seen.  No acute bone abnormality. IMPRESSION: No acute cardiopulmonary disease. Electronically Signed   By: Lucrezia Europe M.D.   On: 12/16/2020 15:47    Procedures Procedures (including critical care time)  Medications Ordered in ED Medications  dexamethasone (DECADRON) injection 10 mg (10 mg Intravenous Given 12/16/20 1820)  ketorolac (TORADOL) 30 MG/ML injection 30 mg (30 mg Intravenous Given 12/16/20 1819)    ED Course  I have reviewed the triage vital signs and the nursing notes.  Pertinent labs & imaging results that were available during my care of the patient were reviewed by me and  considered in my medical decision making (see chart for details).    MDM Rules/Calculators/A&P                          Pt is feeling much better.  Cardiac work up is negative.  Pt's EKG suggests pericarditis.  Pt to f/u with pcp/cardiologist.  Pt is stable for d/c.  Return if worse.  Final Clinical Impression(s) / ED Diagnoses Final diagnoses:  Acute pericarditis, unspecified type    Rx / DC Orders ED Discharge Orders         Ordered    predniSONE (STERAPRED UNI-PAK 21 TAB) 10 MG (21) TBPK tablet        12/16/20 2007           Isla Pence, MD 12/16/20 2009

## 2020-12-17 LAB — SARS CORONAVIRUS 2 (TAT 6-24 HRS): SARS Coronavirus 2: NEGATIVE

## 2020-12-19 DIAGNOSIS — I319 Disease of pericardium, unspecified: Secondary | ICD-10-CM | POA: Diagnosis not present

## 2020-12-19 DIAGNOSIS — Z6822 Body mass index (BMI) 22.0-22.9, adult: Secondary | ICD-10-CM | POA: Diagnosis not present

## 2020-12-20 ENCOUNTER — Other Ambulatory Visit: Payer: Self-pay

## 2020-12-20 ENCOUNTER — Encounter: Payer: Self-pay | Admitting: Cardiology

## 2020-12-20 ENCOUNTER — Other Ambulatory Visit (HOSPITAL_COMMUNITY)
Admission: RE | Admit: 2020-12-20 | Discharge: 2020-12-20 | Disposition: A | Discharge status: Home / Self Care | Payer: PPO | Source: Ambulatory Visit | Attending: Cardiology | Admitting: Cardiology

## 2020-12-20 ENCOUNTER — Ambulatory Visit (INDEPENDENT_AMBULATORY_CARE_PROVIDER_SITE_OTHER): Payer: PPO | Admitting: Cardiology

## 2020-12-20 VITALS — BP 124/78 | HR 94 | Ht 71.5 in | Wt 155.0 lb

## 2020-12-20 DIAGNOSIS — R9431 Abnormal electrocardiogram [ECG] [EKG]: Secondary | ICD-10-CM

## 2020-12-20 DIAGNOSIS — R079 Chest pain, unspecified: Secondary | ICD-10-CM | POA: Diagnosis not present

## 2020-12-20 LAB — SEDIMENTATION RATE: Sed Rate: 16 mm/hr (ref 0–16)

## 2020-12-20 MED ORDER — COLCHICINE 0.6 MG PO TABS: 0.6000 mg | ORAL_TABLET | Freq: Every day | ORAL | 0 refills | Status: DC

## 2020-12-20 NOTE — Patient Instructions (Signed)
Medication Instructions:  Take colchicine 0.6 mg daily for 30 days and then Stop.   *If you need a refill on your cardiac medications before your next appointment, please call your pharmacy*   Lab Work:   ESR, CRP today  If you have labs (blood work) drawn today and your tests are completely normal, you will receive your results only by: Marland Kitchen MyChart Message (if you have MyChart) OR . A paper copy in the mail If you have any lab test that is abnormal or we need to change your treatment, we will call you to review the results.   Testing/Procedures: Your physician has requested that you have an echocardiogram. Echocardiography is a painless test that uses sound waves to create images of your heart. It provides your doctor with information about the size and shape of your heart and how well your heart's chambers and valves are working. This procedure takes approximately one hour. There are no restrictions for this procedure.     Follow-Up: At [facility], you and your health needs are our priority.  As part of our continuing mission to provide you with exceptional heart care, we have created designated Provider Care Teams.  These Care Teams include your primary Cardiologist (physician) and Advanced Practice Providers (APPs -  Physician Assistants and Nurse Practitioners) who all work together to provide you with the care you need, when you need it.  We recommend signing up for the patient portal called "MyChart".  Sign up information is provided on this After Visit Summary.  MyChart is used to connect with patients for Virtual Visits (Telemedicine).  Patients are able to view lab/test results, encounter notes, upcoming appointments, etc.  Non-urgent messages can be sent to your provider as well.   To learn more about what you can do with MyChart, go to [email or web address].    Your next appointment:   1 month(s)  The format for your next appointment:   In Person  Provider:   You  will see one of the following Advanced Practice Providers on your designated Care Team:    Bernerd Pho, PA-C   Ermalinda Barrios, PA-C       Other Instructions None       Thank you for choosing [facility] !

## 2020-12-20 NOTE — Progress Notes (Signed)
   Clinical Summary Mr. Demarest is a 70 y.o.male seen as new patient for the following medical problems.   1. Chest pain - seen in ER Jan 10,2022 with chest pain - trop neg x2, ddimer neg, COVID neg - CXR no acute process - EKG poor data quality, some evidence of diffuse ST elevation possibly consistent with pericarditis - ED started on a steroid course for possible pericarditis.   - pain had started 1 week ago. Midchest discomfort initially, some SOB. Symptoms ongoign for few days. Better with laying down, worst movement. Worst with deep breathing. Unsure of duration.  - pain resolving since ER discharge. Discharged on a steroid taper of prednisone.      Past Medical History:  Diagnosis Date  . Abnormal weight loss   . Acid reflux   . Acute prostatitis   . Acute sinusitis, unspecified   . Chronic idiopathic constipation   . Colon polyps   . Enlarged prostate   . Hyperlipidemia, unspecified   . Incisional hernia without obstruction or gangrene   . Lumbago   . Nonspecific elevation of levels of transaminase and lactic acid dehydrogenase (LDH)   . Other male erectile dysfunction   . Other obstructive and reflux uropathy   . Radiculopathy, lumbar region   . Rash and other nonspecific skin eruption   . Reflux esophagitis      No Known Allergies   Current Outpatient Medications  Medication Sig Dispense Refill  . Chlorphen-Phenyleph-ASA (ALKA-SELTZER PLUS COLD PO) Take 2 tablets by mouth daily as needed (cold symptoms).    . linaclotide (LINZESS) 145 MCG CAPS capsule Take 1 capsule (145 mcg total) by mouth daily before breakfast. (Patient not taking: Reported on 01/18/2018) 30 capsule 5  . Multiple Vitamins-Minerals (MULTIVITAMINS THER. W/MINERALS) TABS Take 1 tablet by mouth daily.    . naproxen sodium (ALEVE) 220 MG tablet Take 220 mg by mouth daily as needed (pain).    . omeprazole (PRILOSEC) 20 MG capsule Take 1 capsule (20 mg total) by mouth daily. 30 capsule 11  .  polyethylene glycol (MIRALAX / GLYCOLAX) packet Take 17 g by mouth every other day.    . predniSONE (STERAPRED UNI-PAK 21 TAB) 10 MG (21) TBPK tablet Take 6 tabs for 2 days, then 5 for 2 days, then 4 for 2 days, then 3 for 2 days, 2 for 2 days, then 1 for 2 days 42 tablet 0  . tamsulosin (FLOMAX) 0.4 MG CAPS capsule Take 0.4 mg by mouth daily after breakfast.    . triamcinolone (KENALOG) 0.025 % cream Apply 1 application topically 2 (two) times daily.     No current facility-administered medications for this visit.     Past Surgical History:  Procedure Laterality Date  . COLONOSCOPY  12/29/2005   RMR:Internal hemorrhoids, anal papilla/Diminutive polyp on a stalk at 10 cm/otherwise normal rectum and colon, PATH: hyperplastic polyp  . COLONOSCOPY N/A 04/10/2013   RMR: tubular adenoma: next TCS 04/2018  . COLONOSCOPY WITH PROPOFOL N/A 02/10/2018   Procedure: COLONOSCOPY WITH PROPOFOL;  Surgeon: Rourk, Robert M, MD;  [facility];  Service: Endoscopy;  Laterality: N/A;  . ESOPHAGOGASTRODUODENOSCOPY  12/29/2005   RMR:noncritical Schatzki's ring, small hiatal hernia, normal gastric mucosa, normal D1 and D2.   . HAND SURGERY     bilateral, got caught in a machine, two different occasions, right hand originally, than left hand. Left hand with skin graft  . SPINAL CORD DECOMPRESSION       No   Known Allergies    Family History  Problem Relation Age of Onset  . Colon cancer Brother        60s  . Healthy Mother   . Healthy Father      Social History Mr. Revere reports that he has quit smoking. He has never used smokeless tobacco. Mr. Raybourn reports no history of alcohol use.   Review of Systems CONSTITUTIONAL: No weight loss, fever, chills, weakness or fatigue.  HEENT: Eyes: No visual loss, blurred vision, double vision or yellow sclerae.No hearing loss, sneezing, congestion, runny nose or sore throat.  SKIN: No rash or itching.  CARDIOVASCULAR: per hpi RESPIRATORY: No  shortness of breath, cough or sputum.  GASTROINTESTINAL: No anorexia, nausea, vomiting or diarrhea. No abdominal pain or blood.  GENITOURINARY: No burning on urination, no polyuria NEUROLOGICAL: No headache, dizziness, syncope, paralysis, ataxia, numbness or tingling in the extremities. No change in bowel or bladder control.  MUSCULOSKELETAL: No muscle, back pain, joint pain or stiffness.  LYMPHATICS: No enlarged nodes. No history of splenectomy.  PSYCHIATRIC: No history of depression or anxiety.  ENDOCRINOLOGIC: No reports of sweating, cold or heat intolerance. No polyuria or polydipsia.  .   Physical Examination Today's Vitals   12/20/20 1441  BP: 124/78  Pulse: 94  SpO2: 97%  Weight: 155 lb (70.3 kg)  Height: 5' 11.5" (1.816 m)   Body mass index is 21.32 kg/m.  Gen: resting comfortably, no acute distress HEENT: no scleral icterus, pupils equal round and reactive, no palptable cervical adenopathy,  CV: RRR, no m/r/g, no jvd Resp: Clear to auscultation bilaterally GI: abdomen is soft, non-tender, non-distended, normal bowel sounds, no hepatosplenomegaly MSK: extremities are warm, no edema.  Skin: warm, no rash Neuro:  no focal deficits Psych: appropriate affect       Assessment and Plan  1. Chest pain -from ER notes concern for possible pericarditis, was started on steroid taper - we will obtain an ESR/CRP/echo to further evaluate. WOuld not commit to colchicine or additional antiinflamatories until more info is known.       Jonathan F. Branch, M.D. 

## 2020-12-22 LAB — HIGH SENSITIVITY CRP: CRP, High Sensitivity: 0.41 mg/L (ref 0.00–3.00)

## 2020-12-24 ENCOUNTER — Telehealth: Payer: Self-pay | Admitting: *Deleted

## 2020-12-24 NOTE — Telephone Encounter (Signed)
-----   Message from Arnoldo Lenis, MD sent at 12/23/2020 12:48 PM EST ----- Inflammation makers are normal, would use colchicine only for the 30 days that we have initially planned, he can finish the prednisone taper that was prescribed in the ER  Zandra Abts MD

## 2020-12-27 ENCOUNTER — Ambulatory Visit (HOSPITAL_COMMUNITY)
Admission: RE | Admit: 2020-12-27 | Discharge: 2020-12-27 | Disposition: A | Discharge status: Home / Self Care | Payer: PPO | Source: Ambulatory Visit | Attending: Cardiology | Admitting: Cardiology

## 2020-12-27 ENCOUNTER — Other Ambulatory Visit: Payer: Self-pay

## 2020-12-27 DIAGNOSIS — R079 Chest pain, unspecified: Secondary | ICD-10-CM | POA: Diagnosis not present

## 2020-12-27 LAB — ECHOCARDIOGRAM COMPLETE
Area-P 1/2: 3.11 cm2
S' Lateral: 2.29 cm

## 2020-12-27 NOTE — Progress Notes (Signed)
*  PRELIMINARY RESULTS* Echocardiogram 2D Echocardiogram has been performed.  Daniel Duffy 12/27/2020, 10:10 AM

## 2020-12-31 NOTE — Telephone Encounter (Signed)
Have tried to reach pt multiple times  °

## 2021-01-07 ENCOUNTER — Telehealth: Payer: Self-pay | Admitting: *Deleted

## 2021-01-07 NOTE — Telephone Encounter (Signed)
duplicate

## 2021-01-07 NOTE — Telephone Encounter (Signed)
My Chart message sent

## 2021-01-13 ENCOUNTER — Telehealth: Payer: Self-pay | Admitting: *Deleted

## 2021-01-13 NOTE — Telephone Encounter (Signed)
-----   Message from Arnoldo Lenis, MD sent at 01/13/2021 12:06 PM EST ----- Echo overall looks good, normal heart function   Zandra Abts MD

## 2021-01-13 NOTE — Telephone Encounter (Signed)
Pt aware.

## 2021-01-14 NOTE — Progress Notes (Signed)
Cardiology Office Note    Date:  01/20/2021   ID:  Daniel Duffy, DOB 1951/07/25, MRN [id number]  PCP:  Cory Munch, PA-C  Cardiologist: Carlyle Dolly, MD EPS: None  Chief Complaint  Patient presents with  . Follow-up    History of Present Illness:  Daniel Duffy is a 70 y.o. male patient who was in ER 12/16/20 with chest pain, trop negative EKG poor quality but some evidence diffuse ST elevation, possible pericarditis and discharged on steroid course.  Dr. Harl Bowie saw him 12/20/20 and started on colchicine. Echo 12/27/20 normal.  Patient comes in for f/u. Doing much better on colchicine. Still having some discomfort with exertion but goes away quickly. Can do much more than he had been able to do. Ran out of colchicine yesterday.     Past Medical History:  Diagnosis Date  . Abnormal weight loss   . Acid reflux   . Acute prostatitis   . Acute sinusitis, unspecified   . Chronic idiopathic constipation   . Colon polyps   . Enlarged prostate   . Hyperlipidemia, unspecified   . Incisional hernia without obstruction or gangrene   . Lumbago   . Nonspecific elevation of levels of transaminase and lactic acid dehydrogenase (LDH)   . Other male erectile dysfunction   . Other obstructive and reflux uropathy   . Radiculopathy, lumbar region   . Rash and other nonspecific skin eruption   . Reflux esophagitis     Past Surgical History:  Procedure Laterality Date  . COLONOSCOPY  12/29/2005   ZDG:LOVFIEPP hemorrhoids, anal papilla/Diminutive polyp on a stalk at 10 cm/otherwise normal rectum and colon, PATH: hyperplastic polyp  . COLONOSCOPY N/A 04/10/2013   RMR: tubular adenoma: next TCS 04/2018  . COLONOSCOPY WITH PROPOFOL N/A 02/10/2018   Procedure: COLONOSCOPY WITH PROPOFOL;  Surgeon: Daneil Dolin, MD;  [facility];  Service: Endoscopy;  Laterality: N/A;  . ESOPHAGOGASTRODUODENOSCOPY  12/29/2005   IRJ:JOACZYSAYTK Schatzki's ring, small hiatal hernia, normal  gastric mucosa, normal D1 and D2.   . HAND SURGERY     bilateral, got caught in a machine, two different occasions, right hand originally, than left hand. Left hand with skin graft  . SPINAL CORD DECOMPRESSION      Current Medications: Current Meds  Medication Sig  . Chlorphen-Phenyleph-ASA (ALKA-SELTZER PLUS COLD PO) Take 2 tablets by mouth daily as needed (cold symptoms).  . Multiple Vitamins-Minerals (MULTIVITAMINS THER. W/MINERALS) TABS Take 1 tablet by mouth daily.  Marland Kitchen omeprazole (PRILOSEC) 20 MG capsule Take 1 capsule (20 mg total) by mouth daily.  . polyethylene glycol (MIRALAX / GLYCOLAX) packet Take 17 g by mouth every other day.  . tamsulosin (FLOMAX) 0.4 MG CAPS capsule Take 0.4 mg by mouth.     Allergies:   Patient has no known allergies.   Social History   Socioeconomic History  . Marital status: Divorced    Spouse name: Not on file  . Number of children: Not on file  . Years of education: Not on file  . Highest education level: Not on file  Occupational History  . Not on file  Tobacco Use  . Smoking status: Former Research scientist (life sciences)  . Smokeless tobacco: Never Used  Vaping Use  . Vaping Use: Never used  Substance and Sexual Activity  . Alcohol use: No  . Drug use: No  . Sexual activity: Never  Other Topics Concern  . Not on file  Social History Narrative  . Not on file  Social Determinants of Health   Financial Resource Strain: Not on file  Food Insecurity: Not on file  Transportation Needs: Not on file  Physical Activity: Not on file  Stress: Not on file  Social Connections: Not on file     Family History:  The patient's family history includes CAD in his brother.   ROS:   Please see the history of present illness.    ROS All other systems reviewed and are negative.   PHYSICAL EXAM:   VS:  BP 134/70   Pulse 80   Ht 5' 11.5" (1.816 m)   Wt 162 lb (73.5 kg)   SpO2 95%   BMI 22.28 kg/m   Physical Exam  GEN: Thin, in no acute distress  Neck: no JVD,  carotid bruits, or masses Cardiac:RRR; no murmurs, rubs, or gallops  Respiratory:  clear to auscultation bilaterally, normal work of breathing GI: soft, nontender, nondistended, + BS Ext: without cyanosis, clubbing, or edema, Good distal pulses bilaterally Neuro:  Alert and Oriented x 3 Psych: euthymic mood, full affect  Wt Readings from Last 3 Encounters:  01/20/21 162 lb (73.5 kg)  12/20/20 155 lb (70.3 kg)  12/16/20 156 lb (70.8 kg)      Studies/Labs Reviewed:   EKG:  EKG is not ordered today.    Recent Labs: 12/16/2020: BUN 13; Creatinine, Ser 1.13; Hemoglobin 14.6; Platelets 168; Potassium 4.6; Sodium 136   Lipid Panel    Component Value Date/Time   CHOL 201 (A) 05/23/2019 0000   TRIG 65 05/23/2019 0000   HDL 51 05/23/2019 0000   LDLCALC 134 05/23/2019 0000    Additional studies/ records that were reviewed today include:  Echo 12/27/20 IMPRESSIONS     1. Left ventricular ejection fraction, by estimation, is 55 to 60%. The  left ventricle has normal function. The left ventricle has no regional  wall motion abnormalities. Left ventricular diastolic parameters are  indeterminate.   2. Right ventricular systolic function is normal. The right ventricular  size is normal. There is normal pulmonary artery systolic pressure. The  estimated right ventricular systolic pressure is 47.4 mmHg.   3. The mitral valve is grossly normal. Trivial mitral valve  regurgitation.   4. The aortic valve is tricuspid. Aortic valve regurgitation is not  visualized.   5. The inferior vena cava is normal in size with greater than 50%  respiratory variability, suggesting right atrial pressure of 3 mmHg.     Risk Assessment/Calculations:         ASSESSMENT:    1. Acute idiopathic pericarditis      PLAN:  In order of problems listed above:  Chest pain felt secondary to pericarditis started on colchicine. Echo normal CRP and SED normal. Patient's symptoms have improved but still  having some chest pain. Will refill colchicine for 30 days with another month refill. F/u with Dr. Harl Bowie in 6 weeks to reassess.   Shared Decision Making/Informed Consent        Medication Adjustments/Labs and Tests Ordered: Current medicines are reviewed at length with the patient today.  Concerns regarding medicines are outlined above.  Medication changes, Labs and Tests ordered today are listed in the Patient Instructions below. Patient Instructions  Medication Instructions:  Your physician recommends that you continue on your current medications as directed. Please refer to the Current Medication list given to you today.  *If you need a refill on your cardiac medications before your next appointment, please call your pharmacy*   Lab Work:  None Today If you have labs (blood work) drawn today and your tests are completely normal, you will receive your results only by: Marland Kitchen MyChart Message (if you have MyChart) OR . A paper copy in the mail If you have any lab test that is abnormal or we need to change your treatment, we will call you to review the results.   Testing/Procedures: None Today   Follow-Up: At [facility] you and your health needs are our priority.  As part of our continuing mission to provide you with exceptional heart care, we have created designated Provider Care Teams.  These Care Teams include your primary Cardiologist (physician) and Advanced Practice Providers (APPs -  Physician Assistants and Nurse Practitioners) who all work together to provide you with the care you need, when you need it.  We recommend signing up for the patient portal called "MyChart".  Sign up information is provided on this After Visit Summary.  MyChart is used to connect with patients for Virtual Visits (Telemedicine).  Patients are able to view lab/test results, encounter notes, upcoming appointments, etc.  Non-urgent messages can be sent to your provider as well.   To learn more about  what you can do with MyChart, go to [email or web address].    Your next appointment:   6 month(s)  The format for your next appointment:   In Person  Provider:   Carlyle Dolly, MD   Other Instructions None Today        Signed, Ermalinda Barrios, PA-C  01/20/2021 11:26 AM    [facility] [address], [place], [place]  [address] Phone: [phone]; Fax: [phone]

## 2021-01-20 ENCOUNTER — Encounter: Payer: Self-pay | Admitting: Physician Assistant

## 2021-01-20 ENCOUNTER — Ambulatory Visit: Payer: PPO | Admitting: Physician Assistant

## 2021-01-20 ENCOUNTER — Other Ambulatory Visit: Payer: Self-pay

## 2021-01-20 VITALS — BP 134/70 | HR 80 | Ht 71.5 in | Wt 162.0 lb

## 2021-01-20 DIAGNOSIS — I300 Acute nonspecific idiopathic pericarditis: Secondary | ICD-10-CM

## 2021-01-20 MED ORDER — COLCHICINE 0.6 MG PO TABS: 0.6000 mg | ORAL_TABLET | Freq: Every day | ORAL | 1 refills | Status: DC

## 2021-01-20 NOTE — Patient Instructions (Signed)
Medication Instructions:  Your physician recommends that you continue on your current medications as directed. Please refer to the Current Medication list given to you today.  *If you need a refill on your cardiac medications before your next appointment, please call your pharmacy*   Lab Work: None Today If you have labs (blood work) drawn today and your tests are completely normal, you will receive your results only by: Marland Kitchen MyChart Message (if you have MyChart) OR . A paper copy in the mail If you have any lab test that is abnormal or we need to change your treatment, we will call you to review the results.   Testing/Procedures: None Today   Follow-Up: At [facility], you and your health needs are our priority.  As part of our continuing mission to provide you with exceptional heart care, we have created designated Provider Care Teams.  These Care Teams include your primary Cardiologist (physician) and Advanced Practice Providers (APPs -  Physician Assistants and Nurse Practitioners) who all work together to provide you with the care you need, when you need it.  We recommend signing up for the patient portal called "MyChart".  Sign up information is provided on this After Visit Summary.  MyChart is used to connect with patients for Virtual Visits (Telemedicine).  Patients are able to view lab/test results, encounter notes, upcoming appointments, etc.  Non-urgent messages can be sent to your provider as well.   To learn more about what you can do with MyChart, go to [email or web address].    Your next appointment:   6 month(s)  The format for your next appointment:   In Person  Provider:   Carlyle Dolly, MD   Other Instructions None Today

## 2021-03-11 DIAGNOSIS — J019 Acute sinusitis, unspecified: Secondary | ICD-10-CM | POA: Diagnosis not present

## 2021-03-11 DIAGNOSIS — Z6823 Body mass index (BMI) 23.0-23.9, adult: Secondary | ICD-10-CM | POA: Diagnosis not present

## 2021-03-11 DIAGNOSIS — Z1389 Encounter for screening for other disorder: Secondary | ICD-10-CM | POA: Diagnosis not present

## 2021-03-11 DIAGNOSIS — Z0000 Encounter for general adult medical examination without abnormal findings: Secondary | ICD-10-CM | POA: Diagnosis not present

## 2021-03-11 DIAGNOSIS — Z1331 Encounter for screening for depression: Secondary | ICD-10-CM | POA: Diagnosis not present

## 2021-03-11 DIAGNOSIS — R6882 Decreased libido: Secondary | ICD-10-CM | POA: Diagnosis not present

## 2021-03-17 ENCOUNTER — Telehealth: Payer: Self-pay | Admitting: Cardiology

## 2021-03-17 ENCOUNTER — Other Ambulatory Visit: Payer: Self-pay | Admitting: Physician Assistant

## 2021-03-17 MED ORDER — COLCHICINE 0.6 MG PO TABS: 0.6000 mg | ORAL_TABLET | Freq: Every day | ORAL | 1 refills | Status: DC

## 2021-03-17 NOTE — Telephone Encounter (Signed)
New message      *STAT* If patient is at the pharmacy, call can be transferred to refill team.   1. Which medications need to be refilled? (please list name of each medication and dose if known) colchicine 0.6 MG tablet  2. Which pharmacy/location (including street and city if local pharmacy) is medication to be sent to? [facility] on [address]   3. Do they need a 30 day or 90 day supply?  [place]

## 2021-04-21 ENCOUNTER — Ambulatory Visit: Payer: PPO | Admitting: Urology

## 2021-04-21 ENCOUNTER — Other Ambulatory Visit: Payer: Self-pay

## 2021-04-21 ENCOUNTER — Encounter: Payer: Self-pay | Admitting: Urology

## 2021-04-21 VITALS — BP 142/92 | HR 88 | Temp 98.1°F | Ht 71.5 in | Wt 164.0 lb

## 2021-04-21 DIAGNOSIS — R3912 Poor urinary stream: Secondary | ICD-10-CM | POA: Diagnosis not present

## 2021-04-21 DIAGNOSIS — N401 Enlarged prostate with lower urinary tract symptoms: Secondary | ICD-10-CM | POA: Diagnosis not present

## 2021-04-21 DIAGNOSIS — N138 Other obstructive and reflux uropathy: Secondary | ICD-10-CM | POA: Diagnosis not present

## 2021-04-21 DIAGNOSIS — R972 Elevated prostate specific antigen [PSA]: Secondary | ICD-10-CM

## 2021-04-21 LAB — URINALYSIS, ROUTINE W REFLEX MICROSCOPIC
Bilirubin, UA: NEGATIVE
Glucose, UA: NEGATIVE
Ketones, UA: NEGATIVE
Leukocytes,UA: NEGATIVE
Nitrite, UA: NEGATIVE
Protein,UA: NEGATIVE
Specific Gravity, UA: 1.02 (ref 1.005–1.030)
Urobilinogen, Ur: 0.2 mg/dL (ref 0.2–1.0)
pH, UA: 7 (ref 5.0–7.5)

## 2021-04-21 LAB — MICROSCOPIC EXAMINATION
Bacteria, UA: NONE SEEN
Epithelial Cells (non renal): NONE SEEN /hpf (ref 0–10)
Renal Epithel, UA: NONE SEEN /hpf
WBC, UA: NONE SEEN /hpf (ref 0–5)

## 2021-04-21 NOTE — Progress Notes (Addendum)
04/21/2021 10:56 AM   Daniel Duffy 1951-05-04 [id number]  Referring provider: Cory Munch, PA-C [place],  [place] [address]  CC: PSA elevation   HPI:  New patient-  1) PSA elevation - he has a h/o PSA elevation but no bx. His 10/18 PSA was 4.4 and recheck 3.03. His 06/20 PSA was 3.9 and 04/22 PSA 6.9, hematocrit 45, testosterone 1364.  He has a history of low testosterone. He doesn't recall or list T replacement.   His brother had PCa and he recalls what sounds like XRT.   He has a h/o BPH on tamsulosin. PVR is 11.  Neurogenic risk includes spinal cord surgery.  He has some urgency and nocturia.    He has erectile dysfunction. His prior SHIM was 10 and he tried sildenafil.   He saw Dr. Louis Meckel in [facility] 2018 - 2019.   PMH: Past Medical History:  Diagnosis Date  . Abnormal weight loss   . Acid reflux   . Acute prostatitis   . Acute sinusitis, unspecified   . Chronic idiopathic constipation   . Colon polyps   . Enlarged prostate   . Hyperlipidemia, unspecified   . Incisional hernia without obstruction or gangrene   . Lumbago   . Nonspecific elevation of levels of transaminase and lactic acid dehydrogenase (LDH)   . Other male erectile dysfunction   . Other obstructive and reflux uropathy   . Radiculopathy, lumbar region   . Rash and other nonspecific skin eruption   . Reflux esophagitis     Surgical History: Past Surgical History:  Procedure Laterality Date  . COLONOSCOPY  12/29/2005   VZD:GLOVFIEP hemorrhoids, anal papilla/Diminutive polyp on a stalk at 10 cm/otherwise normal rectum and colon, PATH: hyperplastic polyp  . COLONOSCOPY N/A 04/10/2013   RMR: tubular adenoma: next TCS 04/2018  . COLONOSCOPY WITH PROPOFOL N/A 02/10/2018   Procedure: COLONOSCOPY WITH PROPOFOL;  Surgeon: Daneil Dolin, MD;  [facility];  Service: Endoscopy;  Laterality: N/A;  . ESOPHAGOGASTRODUODENOSCOPY  12/29/2005   PIR:JJOACZYSAYT Schatzki's  ring, small hiatal hernia, normal gastric mucosa, normal D1 and D2.   . HAND SURGERY     bilateral, got caught in a machine, two different occasions, right hand originally, than left hand. Left hand with skin graft  . SPINAL CORD DECOMPRESSION      Home Medications:  Allergies as of 04/21/2021   No Known Allergies     Medication List       Accurate as of Apr 21, 2021 10:56 AM. If you have any questions, ask your nurse or doctor.        ALKA-SELTZER PLUS COLD PO Take 2 tablets by mouth daily as needed (cold symptoms).   colchicine 0.6 MG tablet TAKE 1 TABLET(0.6 MG) BY MOUTH DAILY   multivitamins ther. w/minerals Tabs tablet Take 1 tablet by mouth daily.   omeprazole 20 MG capsule Commonly known as: PRILOSEC Take 1 capsule (20 mg total) by mouth daily.   polyethylene glycol 17 g packet Commonly known as: MIRALAX / GLYCOLAX Take 17 g by mouth every other day.   tamsulosin 0.4 MG Caps capsule Commonly known as: FLOMAX Take 0.4 mg by mouth.       Allergies: No Known Allergies  Family History: Family History  Problem Relation Age of Onset  . CAD Brother     Social History:  reports that he has quit smoking. He has never used smokeless tobacco. He reports that he does  not drink alcohol and does not use drugs.   Physical Exam: There were no vitals taken for this visit.  Constitutional:  Alert and oriented, No acute distress. HEENT: [place] AT, moist mucus membranes.  Trachea midline, no masses. Cardiovascular: No clubbing, cyanosis, or edema. Respiratory: Normal respiratory effort, no increased work of breathing. GI: Abdomen is soft, nontender, nondistended, no abdominal masses Lymph: No cervical or inguinal lymphadenopathy. Skin: No rashes, bruises or suspicious lesions. Neurologic: Grossly intact, no focal deficits, moving all 4 extremities. Psychiatric: Normal mood and affect. GU: prostate 40 g on exam and benign, no hard area or nodule   Laboratory Data: Lab  Results  Component Value Date   WBC 5.2 12/16/2020   HGB 14.6 12/16/2020   HCT 45.8 12/16/2020   MCV 88.1 12/16/2020   PLT 168 12/16/2020    Lab Results  Component Value Date   CREATININE 1.13 12/16/2020    Lab Results  Component Value Date   PSA 3.9 05/23/2019    No results found for: TESTOSTERONE  Lab Results  Component Value Date   HGBA1C 6.2 07/12/2015    Urinalysis    Component Value Date/Time   COLORURINE YELLOW 05/02/2017 1402   APPEARANCEUR CLEAR 05/02/2017 1402   LABSPEC 1.010 05/02/2017 1402   PHURINE 5.5 05/02/2017 1402   GLUCOSEU NEGATIVE 05/02/2017 1402   HGBUR NEGATIVE 05/02/2017 1402   BILIRUBINUR NEGATIVE 05/02/2017 1402   KETONESUR NEGATIVE 05/02/2017 1402   PROTEINUR NEGATIVE 05/02/2017 1402   UROBILINOGEN 0.2 01/29/2014 2300   NITRITE NEGATIVE 05/02/2017 1402   LEUKOCYTESUR NEGATIVE 05/02/2017 1402    No results found for: LABMICR, [place], RBCUA, LABEPIT, MUCUS, BACTERIA    Assessment & Plan:    Elevated PSA- I had a long discussion with the patient on the nature of elevated PSA - benign vs malignant causes. We discussed age specific levels and that PCa can be seen on a biopsy with very low PSA levels (<=2.5). We discussed the nature risks and benefits of continued surveillance, other lab tests, imaging as well as prostate biopsy. We discussed the management of prostate cancer might include active surveillance or treatment depending on biopsy findings.  We also discussed relationship to testosterone and prostate cancer.  Prostate cancer is associated with a low testosterone.  All questions answered. His DRE is benign. PSA was sent to determine f/u vs biopsy.   BPH - cont tamsulosin   No follow-ups on file.  Festus Aloe, MD

## 2021-04-21 NOTE — Progress Notes (Signed)
post void residual= 11   Urological Symptom Review  Patient is experiencing the following symptoms: Frequent urination Get up at night to urinate Leakage of urine Erection problems (male only)  Hard to postpone urination   Review of Systems  Gastrointestinal (upper)  : Indigestion/heartburn  Gastrointestinal (lower) : Diarrhea Constipation  Constitutional : Weight loss  Skin: Negative for skin symptoms  Eyes: Blurred vision  Ear/Nose/Throat : Sinus problems  Hematologic/Lymphatic: Negative for Hematologic/Lymphatic symptoms  Cardiovascular : Negative for cardiovascular symptoms  Respiratory : Negative for respiratory symptoms  Endocrine: Negative for endocrine symptoms  Musculoskeletal: Negative for musculoskeletal symptoms  Neurological: Headaches Dizziness  Psychologic: Anxiety

## 2021-04-21 NOTE — Patient Instructions (Signed)
Prostate Cancer Screening  Prostate cancer screening is a test that is done to check for the presence of prostate cancer in men. The prostate gland is a walnut-sized gland that is located below the bladder and in front of the rectum in males. The function of the prostate is to add fluid to semen during ejaculation. Prostate cancer is the second most common type of cancer in men. Who should have prostate cancer screening?  Screening recommendations vary based on age and other risk factors. Screening is recommended if:  You are older than age 55. If you are age 55-69, talk with your health care provider about your need for screening and how often screening should be done. Because most prostate cancers are slow growing and will not cause death, screening is generally reserved in this age group for men who have a 10-15-year life expectancy.  You are younger than age 55, and you have these risk factors: ? Being a black male or a male of African descent. ? Having a father, brother, or uncle who has been diagnosed with prostate cancer. The risk is higher if your family member's cancer occurred at an early age. Screening is not recommended if:  You are younger than age 40.  You are between the ages of 40 and 54 and you have no risk factors.  You are 70 years of age or older. At this age, the risks that screening can cause are greater than the benefits that it may provide. If you are at high risk for prostate cancer, your health care provider may recommend that you have screenings more often or that you start screening at a younger age. How is screening for prostate cancer done? The recommended prostate cancer screening test is a blood test called the prostate-specific antigen (PSA) test. PSA is a protein that is made in the prostate. As you age, your prostate naturally produces more PSA. Abnormally high PSA levels may be caused by:  Prostate cancer.  An enlarged prostate that is not caused by cancer  (benign prostatic hyperplasia, BPH). This condition is very common in older men.  A prostate gland infection (prostatitis). Depending on the PSA results, you may need more tests, such as:  A physical exam to check the size of your prostate gland.  Blood and imaging tests.  A procedure to remove tissue samples from your prostate gland for testing (biopsy). What are the benefits of prostate cancer screening?  Screening can help to identify cancer at an early stage, before symptoms start and when the cancer can be treated more easily.  There is a small chance that screening may lower your risk of dying from prostate cancer. The chance is small because prostate cancer is a slow-growing cancer, and most men with prostate cancer die from a different cause. What are the risks of prostate cancer screening? The main risk of prostate cancer screening is diagnosing and treating prostate cancer that would never have caused any symptoms or problems. This is called overdiagnosisand overtreatment. PSA screening cannot tell you if your PSA is high due to cancer or a different cause. A prostate biopsy is the only procedure to diagnose prostate cancer. Even the results of a biopsy may not tell you if your cancer needs to be treated. Slow-growing prostate cancer may not need any treatment other than monitoring, so diagnosing and treating it may cause unnecessary stress or other side effects. A prostate biopsy may also cause:  Infection or fever.  A false negative. This is   a result that shows that you do not have prostate cancer when you actually do have prostate cancer. Questions to ask your health care provider  When should I start prostate cancer screening?  What is my risk for prostate cancer?  How often do I need screening?  What type of screening tests do I need?  How do I get my test results?  What do my results mean?  Do I need treatment? Where to find more information  The American Cancer  Society: www.cancer.org  American Urological Association: www.auanet.org Contact a health care provider if:  You have difficulty urinating.  You have pain when you urinate or ejaculate.  You have blood in your urine or semen.  You have pain in your back or in the area of your prostate. Summary  Prostate cancer is a common type of cancer in men. The prostate gland is located below the bladder and in front of the rectum. This gland adds fluid to semen during ejaculation.  Prostate cancer screening may identify cancer at an early stage, when the cancer can be treated more easily.  The prostate-specific antigen (PSA) test is the recommended screening test for prostate cancer.  Discuss the risks and benefits of prostate cancer screening with your health care provider. If you are age 42 or older, the risks that screening can cause are greater than the benefits that it may provide. This information is not intended to replace advice given to you by your health care provider. Make sure you discuss any questions you have with your health care provider. Document Revised: 03/15/2020 Document Reviewed: 07/06/2019 Elsevier Patient Education  [place].

## 2021-04-22 LAB — PSA, TOTAL AND FREE
PSA, Free Pct: 23.3 %
PSA, Free: 1.33 ng/mL
Prostate Specific Ag, Serum: 5.7 ng/mL — ABNORMAL HIGH (ref 0.0–4.0)

## 2021-04-24 ENCOUNTER — Telehealth: Payer: Self-pay

## 2021-04-24 ENCOUNTER — Other Ambulatory Visit: Payer: Self-pay

## 2021-04-24 DIAGNOSIS — R972 Elevated prostate specific antigen [PSA]: Secondary | ICD-10-CM

## 2021-04-24 MED ORDER — LEVOFLOXACIN 750 MG PO TABS: 750.0000 mg | ORAL_TABLET | Freq: Once | ORAL | 0 refills | Status: AC

## 2021-04-24 NOTE — Telephone Encounter (Signed)
Patient called and instructions reviewed with patient. Medication sent to pharmacy. Instructions mailed to patient as well.  Appointment scheduled with patient. Voiced understanding.

## 2021-04-24 NOTE — Telephone Encounter (Signed)
-----   Message from Festus Aloe, MD sent at 04/23/2021  8:23 AM EDT ----- Daniel Duffy - let Mr Baltzell know his PSA remains elevated at 5.7 and I recommend we schedule a prostate biopsy as discussed. Go ahead and get him scheduled and send in abx. Thanks.    ----- Message ----- From: Dorisann Frames, RN Sent: 04/22/2021   3:03 PM EDT To: Festus Aloe, MD  Please review

## 2021-05-07 NOTE — Telephone Encounter (Signed)
The patient called and wanted to make sure of his appointment time and he had took some ibuprofen a couple of weeks ago per him and he was checking that it would not affect the procedure and that would be out of his system per the patient. No other concerns.

## 2021-05-12 ENCOUNTER — Ambulatory Visit (HOSPITAL_COMMUNITY)
Admission: RE | Admit: 2021-05-12 | Discharge: 2021-05-12 | Disposition: A | Discharge status: Home / Self Care | Payer: PPO | Source: Ambulatory Visit | Attending: Urology | Admitting: Urology

## 2021-05-12 ENCOUNTER — Encounter (HOSPITAL_COMMUNITY): Payer: Self-pay

## 2021-05-12 ENCOUNTER — Ambulatory Visit (INDEPENDENT_AMBULATORY_CARE_PROVIDER_SITE_OTHER): Payer: PPO | Admitting: Urology

## 2021-05-12 ENCOUNTER — Other Ambulatory Visit: Payer: Self-pay

## 2021-05-12 ENCOUNTER — Other Ambulatory Visit: Payer: Self-pay | Admitting: Urology

## 2021-05-12 DIAGNOSIS — R972 Elevated prostate specific antigen [PSA]: Secondary | ICD-10-CM

## 2021-05-12 DIAGNOSIS — D291 Benign neoplasm of prostate: Secondary | ICD-10-CM | POA: Insufficient documentation

## 2021-05-12 MED ORDER — LIDOCAINE HCL (PF) 2 % IJ SOLN: 10.0000 mL | Freq: Once | INTRAMUSCULAR | Status: AC

## 2021-05-12 MED ORDER — GENTAMICIN SULFATE 40 MG/ML IJ SOLN
INTRAMUSCULAR | Status: AC
  Administered 2021-05-12: 160 mg via INTRAMUSCULAR
  Filled 2021-05-12: qty 4

## 2021-05-12 MED ORDER — LIDOCAINE HCL (PF) 2 % IJ SOLN
INTRAMUSCULAR | Status: AC
  Administered 2021-05-12: 10 mL
  Filled 2021-05-12: qty 10

## 2021-05-12 MED ORDER — GENTAMICIN SULFATE 40 MG/ML IJ SOLN: 160.0000 mg | Freq: Once | INTRAMUSCULAR | Status: AC

## 2021-05-12 NOTE — Discharge Instructions (Signed)

## 2021-05-12 NOTE — Progress Notes (Signed)
F/u -   1) PSA elevation - he has a h/o PSA elevation but no bx. His 10/18 PSA was 4.4 and recheck 3.03. His 06/20 PSA was 3.9 and 04/22 PSA 6.9, hematocrit 45, testosterone 1364.  He has a history of low testosterone. He doesn't recall or list T replacement.   His 05/22 PSA was 5.7. Here for bx. Doing well. No dysuria or hematuria.   His brother had PCa and he recalls what sounds like XRT.   2)  BPH on tamsulosin. PVR is 11.  Neurogenic risk includes spinal cord surgery.  He has some urgency and nocturia.    3) ED - His prior SHIM was 10 and he tried sildenafil. He saw Dr. Louis Meckel in [facility] 2018 - 2019.     Prostate Biopsy Procedure   Informed consent was obtained after discussing risks/benefits of the procedure.  A time out was performed to ensure correct patient identity.  Pre-Procedure: - Gentamicin given prophylactically - Levaquin 500 mg administered PO -DRE: prostate 30 g and smooth - no hard area or nodule  -Transrectal Ultrasound performed revealing a 52.56 ml prostate -No significant hypoechoic or median lobe noted  Procedure: - Prostate block performed using 10 cc 1% lidocaine and biopsies taken from sextant areas, a total of 12 under ultrasound guidance.  Post-Procedure: - Patient tolerated the procedure well - He was counseled to seek immediate medical attention if experiences any severe pain, significant bleeding, or fevers - Return in 1-2 weeks to discuss biopsy results

## 2021-05-12 NOTE — Sedation Documentation (Signed)
PT tolerated prostate biopsy procedure and antibiotic injections well today. Labs obtained and sent for pathology. PT ambulatory at discharge with no acute distress noted and verbalized understanding of discharge instructions.  

## 2021-05-12 NOTE — Patient Instructions (Signed)

## 2021-05-26 ENCOUNTER — Other Ambulatory Visit: Payer: Self-pay

## 2021-05-26 ENCOUNTER — Encounter: Payer: Self-pay | Admitting: Urology

## 2021-05-26 ENCOUNTER — Ambulatory Visit: Payer: PPO | Admitting: Urology

## 2021-05-26 VITALS — BP 131/82 | HR 90 | Ht 71.0 in | Wt 161.0 lb

## 2021-05-26 DIAGNOSIS — N528 Other male erectile dysfunction: Secondary | ICD-10-CM

## 2021-05-26 DIAGNOSIS — R3912 Poor urinary stream: Secondary | ICD-10-CM

## 2021-05-26 DIAGNOSIS — N138 Other obstructive and reflux uropathy: Secondary | ICD-10-CM

## 2021-05-26 DIAGNOSIS — N401 Enlarged prostate with lower urinary tract symptoms: Secondary | ICD-10-CM

## 2021-05-26 DIAGNOSIS — R972 Elevated prostate specific antigen [PSA]: Secondary | ICD-10-CM

## 2021-05-26 NOTE — Progress Notes (Addendum)
05/26/2021 3:00 PM   Daniel Duffy 31-Dec-1950 [id number]  Referring provider: Cory Munch, PA-C [address] [place],  [place] [address]  No chief complaint on file.   HPI:  F/u -    1) PSA elevation - he has a h/o PSA elevation. Biopsy 06/22 benign with 53 gram prostate. His 10/18 PSA was 4.4 and recheck 3.03. His 06/20 PSA was 3.9 and 04/22 PSA 6.9, hematocrit 45, testosterone 1364.  He has a history of low testosterone. He doesn't recall or list T replacement.    His 05/22 PSA was 5.7. His brother had PCa and he recalls what sounds like XRT.   2)  BPH on tamsulosin. Prostate 53 g on [place] bx. PVR was 11.  Neurogenic risk includes spinal cord surgery.  He has some urgency and nocturia.     3) ED - His prior SHIM was 10 and he tried sildenafil. He saw Dr. Louis Meckel in [facility] 2018 - 2019. He loses an erection too soon.   Today, seen for the above.   PMH: Past Medical History:  Diagnosis Date   Abnormal weight loss    Acid reflux    Acute prostatitis    Acute sinusitis, unspecified    Chronic idiopathic constipation    Colon polyps    Enlarged prostate    Hyperlipidemia, unspecified    Incisional hernia without obstruction or gangrene    Lumbago    Nonspecific elevation of levels of transaminase and lactic acid dehydrogenase (LDH)    Other male erectile dysfunction    Other obstructive and reflux uropathy    Radiculopathy, lumbar region    Rash and other nonspecific skin eruption    Reflux esophagitis     Surgical History: Past Surgical History:  Procedure Laterality Date   COLONOSCOPY  12/29/2005   VZD:GLOVFIEP hemorrhoids, anal papilla/Diminutive polyp on a stalk at 10 cm/otherwise normal rectum and colon, PATH: hyperplastic polyp   COLONOSCOPY N/A 04/10/2013   RMR: tubular adenoma: next TCS 04/2018   COLONOSCOPY WITH PROPOFOL N/A 02/10/2018   Procedure: COLONOSCOPY WITH PROPOFOL;  Surgeon: Daneil Dolin, MD;  [facility];  Service:  Endoscopy;  Laterality: N/A;   ESOPHAGOGASTRODUODENOSCOPY  12/29/2005   PIR:JJOACZYSAYT Schatzki's ring, small hiatal hernia, normal gastric mucosa, normal D1 and D2.    HAND SURGERY     bilateral, got caught in a machine, two different occasions, right hand originally, than left hand. Left hand with skin graft   SPINAL CORD DECOMPRESSION      Home Medications:  Allergies as of 05/26/2021   No Known Allergies      Medication List        Accurate as of May 26, 2021  3:00 PM. If you have any questions, ask your nurse or doctor.          ALKA-SELTZER PLUS COLD PO Take 2 tablets by mouth daily as needed (cold symptoms).   colchicine 0.6 MG tablet TAKE 1 TABLET(0.6 MG) BY MOUTH DAILY   multivitamins ther. w/minerals Tabs tablet Take 1 tablet by mouth daily.   omeprazole 20 MG capsule Commonly known as: PRILOSEC Take 1 capsule (20 mg total) by mouth daily.   polyethylene glycol 17 g packet Commonly known as: MIRALAX / GLYCOLAX Take 17 g by mouth every other day.   tamsulosin 0.4 MG Caps capsule Commonly known as: FLOMAX Take 0.4 mg by mouth.        Allergies: No Known Allergies  Family History: Family  History  Problem Relation Age of Onset   CAD Brother     Social History:  reports that he has quit smoking. He has never used smokeless tobacco. He reports that he does not drink alcohol and does not use drugs.   Physical Exam: BP 131/82   Pulse 90   Ht 5\' 11"  (1.803 m)   Wt 161 lb (73 kg)   BMI 22.45 kg/m   Constitutional:  Alert and oriented, No acute distress. HEENT: [place] AT, moist mucus membranes.  Trachea midline, no masses. Cardiovascular: No clubbing, cyanosis, or edema. Respiratory: Normal respiratory effort, no increased work of breathing. GI: Abdomen is soft, nontender, nondistended, no abdominal masses GU: No CVA tenderness Skin: No rashes, bruises or suspicious lesions. Neurologic: Grossly intact, no focal deficits, moving all 4  extremities. Psychiatric: Normal mood and affect.  Laboratory Data: Lab Results  Component Value Date   WBC 5.2 12/16/2020   HGB 14.6 12/16/2020   HCT 45.8 12/16/2020   MCV 88.1 12/16/2020   PLT 168 12/16/2020    Lab Results  Component Value Date   CREATININE 1.13 12/16/2020    Lab Results  Component Value Date   PSA 3.9 05/23/2019    No results found for: TESTOSTERONE  Lab Results  Component Value Date   HGBA1C 6.2 07/12/2015    Urinalysis    Component Value Date/Time   COLORURINE YELLOW 05/02/2017 1402   APPEARANCEUR Clear 04/21/2021 1111   LABSPEC 1.010 05/02/2017 1402   PHURINE 5.5 05/02/2017 1402   GLUCOSEU Negative 04/21/2021 1111   HGBUR NEGATIVE 05/02/2017 1402   BILIRUBINUR Negative 04/21/2021 1111   KETONESUR NEGATIVE 05/02/2017 1402   PROTEINUR Negative 04/21/2021 1111   PROTEINUR NEGATIVE 05/02/2017 1402   UROBILINOGEN 0.2 01/29/2014 2300   NITRITE Negative 04/21/2021 1111   NITRITE NEGATIVE 05/02/2017 1402   LEUKOCYTESUR Negative 04/21/2021 1111    Lab Results  Component Value Date   LABMICR See below: 04/21/2021   WBCUA None seen 04/21/2021   LABEPIT None seen 04/21/2021   MUCUS Present 04/21/2021   BACTERIA None seen 04/21/2021     Assessment & Plan:    1) bph with LUTS  - disc again nature r/b of alpha blocker, 5ari, daily pde5i and procedures. He will cont tamsulosin  2) PSA elevation - discussed PSAD and biopsy benign   3) ED - disc nature r/b/a to pde5i and he will think about it.   Festus Aloe, MD  [facility]  [address] [place],  [address] [phone]

## 2021-05-26 NOTE — Progress Notes (Signed)
Urological Symptom Review  Patient is experiencing the following symptoms:   Biopsy follow-up  Review of Systems  Gastrointestinal (upper)  :   Gastrointestinal (lower) :   Constitutional :  Skin:   Eyes:   Ear/Nose/Throat :   Hematologic/Lymphatic:   Cardiovascular :   Respiratory :   Endocrine:   Musculoskeletal:   Neurological:   Psychologic:

## 2021-08-05 ENCOUNTER — Other Ambulatory Visit: Payer: Self-pay

## 2021-08-05 ENCOUNTER — Ambulatory Visit: Payer: PPO | Admitting: Cardiology

## 2021-08-05 ENCOUNTER — Encounter: Payer: Self-pay | Admitting: Cardiology

## 2021-08-05 VITALS — BP 122/74 | HR 79 | Ht 71.0 in | Wt 155.4 lb

## 2021-08-05 DIAGNOSIS — R0789 Other chest pain: Secondary | ICD-10-CM

## 2021-08-05 NOTE — Patient Instructions (Signed)
Medication Instructions:  Your physician has recommended you make the following change in your medication:  STOP Colchicine 0.6 mg tablets   *If you need a refill on your cardiac medications before your next appointment, please call your pharmacy*   Lab Work: None If you have labs (blood work) drawn today and your tests are completely normal, you will receive your results only by: [place] (if you have MyChart) OR A paper copy in the mail If you have any lab test that is abnormal or we need to change your treatment, we will call you to review the results.   Testing/Procedures: None   Follow-Up: At [facility], you and your health needs are our priority.  As part of our continuing mission to provide you with exceptional heart care, we have created designated Provider Care Teams.  These Care Teams include your primary Cardiologist (physician) and Advanced Practice Providers (APPs -  Physician Assistants and Nurse Practitioners) who all work together to provide you with the care you need, when you need it.  We recommend signing up for the patient portal called "MyChart".  Sign up information is provided on this After Visit Summary.  MyChart is used to connect with patients for Virtual Visits (Telemedicine).  Patients are able to view lab/test results, encounter notes, upcoming appointments, etc.  Non-urgent messages can be sent to your provider as well.   To learn more about what you can do with MyChart, go to [email or web address].    Your next appointment:   Follow Up: As Needed   Other Instructions

## 2021-08-05 NOTE — Progress Notes (Signed)
Clinical Summary Mr. Gamel is a 70 y.o.male seen today for follow up of the following medical problems.   1. Chest pain - seen in ER Jan 10,2022 with chest pain - trop neg x2, ddimer neg, COVID neg - CXR no acute process - EKG poor data quality, some evidence of diffuse ST elevation possibly consistent with pericarditis - ED started on a steroid course for possible pericarditis.     - pain had started 1 week ago. Midchest discomfort initially, some SOB. Symptoms ongoign for few days. Better with laying down, worst movement. Worst with deep breathing. Unsure of duration.  - pain resolving since ER discharge. Discharged on a steroid taper of prednisone  Jan 2022 CRP normal, ESR normal Jan 2022 echo LVEF 55-60%, no WMAs, normal RV function, no effusion  - no recurrent chest pain.   Past Medical History:  Diagnosis Date   Abnormal weight loss    Acid reflux    Acute prostatitis    Acute sinusitis, unspecified    Chronic idiopathic constipation    Colon polyps    Enlarged prostate    Hyperlipidemia, unspecified    Incisional hernia without obstruction or gangrene    Lumbago    Nonspecific elevation of levels of transaminase and lactic acid dehydrogenase (LDH)    Other male erectile dysfunction    Other obstructive and reflux uropathy    Radiculopathy, lumbar region    Rash and other nonspecific skin eruption    Reflux esophagitis      No Known Allergies   Current Outpatient Medications  Medication Sig Dispense Refill   Chlorphen-Phenyleph-ASA (ALKA-SELTZER PLUS COLD PO) Take 2 tablets by mouth daily as needed (cold symptoms).     colchicine 0.6 MG tablet TAKE 1 TABLET(0.6 MG) BY MOUTH DAILY 90 tablet 2   Multiple Vitamins-Minerals (MULTIVITAMINS THER. W/MINERALS) TABS Take 1 tablet by mouth daily.     omeprazole (PRILOSEC) 20 MG capsule Take 1 capsule (20 mg total) by mouth daily. 30 capsule 11   polyethylene glycol (MIRALAX / GLYCOLAX) packet Take 17 g by mouth  every other day.     tamsulosin (FLOMAX) 0.4 MG CAPS capsule Take 0.4 mg by mouth.     No current facility-administered medications for this visit.     Past Surgical History:  Procedure Laterality Date   COLONOSCOPY  12/29/2005   SFK:CLEXNTZG hemorrhoids, anal papilla/Diminutive polyp on a stalk at 10 cm/otherwise normal rectum and colon, PATH: hyperplastic polyp   COLONOSCOPY N/A 04/10/2013   RMR: tubular adenoma: next TCS 04/2018   COLONOSCOPY WITH PROPOFOL N/A 02/10/2018   Procedure: COLONOSCOPY WITH PROPOFOL;  Surgeon: Daneil Dolin, MD;  [facility];  Service: Endoscopy;  Laterality: N/A;   ESOPHAGOGASTRODUODENOSCOPY  12/29/2005   YFV:[id number] Schatzki's ring, small hiatal hernia, normal gastric mucosa, normal D1 and D2.    HAND SURGERY     bilateral, got caught in a machine, two different occasions, right hand originally, than left hand. Left hand with skin graft   SPINAL CORD DECOMPRESSION       No Known Allergies    Family History  Problem Relation Age of Onset   CAD Brother      Social History Mr. Streater reports that he has quit smoking. He has never used smokeless tobacco. Mr. Gearhart reports no history of alcohol use.   Review of Systems CONSTITUTIONAL: No weight loss, fever, chills, weakness or fatigue.  HEENT: Eyes: No visual loss, blurred vision, double vision or  yellow sclerae.No hearing loss, sneezing, congestion, runny nose or sore throat.  SKIN: No rash or itching.  CARDIOVASCULAR: per hpi RESPIRATORY: No shortness of breath, cough or sputum.  GASTROINTESTINAL: No anorexia, nausea, vomiting or diarrhea. No abdominal pain or blood.  GENITOURINARY: No burning on urination, no polyuria NEUROLOGICAL: No headache, dizziness, syncope, paralysis, ataxia, numbness or tingling in the extremities. No change in bowel or bladder control.  MUSCULOSKELETAL: No muscle, back pain, joint pain or stiffness.  LYMPHATICS: No enlarged nodes. No history of  splenectomy.  PSYCHIATRIC: No history of depression or anxiety.  ENDOCRINOLOGIC: No reports of sweating, cold or heat intolerance. No polyuria or polydipsia.  Marland Kitchen   Physical Examination Today's Vitals   08/05/21 0844  BP: 122/74  Pulse: 79  SpO2: 97%  Weight: 155 lb 6.4 oz (70.5 kg)  Height: $Remove'5\' 11"'tVhkjpC$  (1.803 m)   Body mass index is 21.67 kg/m.  Gen: resting comfortably, no acute distress HEENT: no scleral icterus, pupils equal round and reactive, no palptable cervical adenopathy,  CV: RRR, no mr/g, no jvd Resp: Clear to auscultation bilaterally GI: abdomen is soft, non-tender, non-distended, normal bowel sounds, no hepatosplenomegaly MSK: extremities are warm, no edema.  Skin: warm, no rash Neuro:  no focal deficits Psych: appropriate affect   Diagnostic Studies Jan 2022 echo IMPRESSIONS     1. Left ventricular ejection fraction, by estimation, is 55 to 60%. The  left ventricle has normal function. The left ventricle has no regional  wall motion abnormalities. Left ventricular diastolic parameters are  indeterminate.   2. Right ventricular systolic function is normal. The right ventricular  size is normal. There is normal pulmonary artery systolic pressure. The  estimated right ventricular systolic pressure is 58.5 mmHg.   3. The mitral valve is grossly normal. Trivial mitral valve  regurgitation.   4. The aortic valve is tricuspid. Aortic valve regurgitation is not  visualized.   5. The inferior vena cava is normal in size with greater than 50%  respiratory variability, suggesting right atrial pressure of 3 mmHg.     Assessment and Plan  1. Chest pain -possible pericarditis based on history and EKG changes, no effusion by echo and normal inflammatory markers though they were drawn after he was on prednisone from the ER - symptoms have resolved, will d/c colchicine. - no indication for any further management or testing, f/u as needed.       Arnoldo Lenis,  M.D.

## 2021-11-17 ENCOUNTER — Other Ambulatory Visit: Payer: Self-pay

## 2021-11-17 ENCOUNTER — Other Ambulatory Visit: Payer: PPO

## 2021-11-17 DIAGNOSIS — R972 Elevated prostate specific antigen [PSA]: Secondary | ICD-10-CM | POA: Diagnosis not present

## 2021-11-18 LAB — PSA: Prostate Specific Ag, Serum: 6 ng/mL — ABNORMAL HIGH (ref 0.0–4.0)

## 2021-11-24 ENCOUNTER — Other Ambulatory Visit: Payer: Self-pay

## 2021-11-24 ENCOUNTER — Ambulatory Visit (INDEPENDENT_AMBULATORY_CARE_PROVIDER_SITE_OTHER): Payer: PPO | Admitting: Urology

## 2021-11-24 ENCOUNTER — Encounter: Payer: Self-pay | Admitting: Urology

## 2021-11-24 VITALS — BP 126/84 | HR 98 | Wt 155.0 lb

## 2021-11-24 DIAGNOSIS — R972 Elevated prostate specific antigen [PSA]: Secondary | ICD-10-CM

## 2021-11-24 DIAGNOSIS — N401 Enlarged prostate with lower urinary tract symptoms: Secondary | ICD-10-CM | POA: Diagnosis not present

## 2021-11-24 DIAGNOSIS — N138 Other obstructive and reflux uropathy: Secondary | ICD-10-CM | POA: Diagnosis not present

## 2021-11-24 DIAGNOSIS — R3912 Poor urinary stream: Secondary | ICD-10-CM | POA: Diagnosis not present

## 2021-11-24 LAB — URINALYSIS, ROUTINE W REFLEX MICROSCOPIC
Bilirubin, UA: NEGATIVE
Glucose, UA: NEGATIVE
Ketones, UA: NEGATIVE
Leukocytes,UA: NEGATIVE
Nitrite, UA: NEGATIVE
Protein,UA: NEGATIVE
RBC, UA: NEGATIVE
Specific Gravity, UA: 1.02 (ref 1.005–1.030)
Urobilinogen, Ur: 0.2 mg/dL (ref 0.2–1.0)
pH, UA: 7 (ref 5.0–7.5)

## 2021-11-24 NOTE — Progress Notes (Signed)
Urological Symptom Review  Patient is experiencing the following symptoms: Get up at night to urinate   Review of Systems  Gastrointestinal (upper)  : Negative for upper GI symptoms  Gastrointestinal (lower) : Constipation  Constitutional : Negative for symptoms  Skin: Negative for skin symptoms  Eyes: Negative for eye symptoms  Ear/Nose/Throat : Sinus problems  Hematologic/Lymphatic: Negative for Hematologic/Lymphatic symptoms  Cardiovascular : Negative for cardiovascular symptoms  Respiratory : Negative for respiratory symptoms  Endocrine: Negative for endocrine symptoms  Musculoskeletal: Negative for musculoskeletal symptoms  Neurological: Negative for neurological symptoms  Psychologic: Negative for psychiatric symptoms

## 2021-11-24 NOTE — Progress Notes (Signed)
11/24/2021 9:04 AM   Daniel Duffy 07-May-1951 [id number]  Referring provider: Cory Munch, PA-C [place],  [place] [address]  No chief complaint on file.   HPI:  1) PSA elevation - he has a h/o PSA elevation. Biopsy 06/22 benign with 53 gram prostate with a PSA of 5.7. His 10/18 PSA was 4.4 and recheck 3.03. His 06/20 PSA was 3.9 and 04/22 PSA 6.9, hematocrit 45, testosterone 1364.  He has a history of low testosterone. He doesn't recall or list T replacement.    His 05/22 PSA was 5.7. His 12/22 PSA was 6.0 (psad 0.11). His brother had PCa and he recalls what sounds like XRT.   2)  BPH on tamsulosin. Prostate 53 g on [place] bx. PVR was 11.  Neurogenic risk includes spinal cord surgery.  He has some urgency and nocturia.  AUASS = 6. He takes tamsulosin.    3) ED - His prior SHIM was 10 and he tried sildenafil. He saw Dr. Louis Meckel in [facility] 2018 - 2019. He loses an erection too soon.     Today, seen for the above.    PMH: Past Medical History:  Diagnosis Date   Abnormal weight loss    Acid reflux    Acute prostatitis    Acute sinusitis, unspecified    Chronic idiopathic constipation    Colon polyps    Enlarged prostate    Hyperlipidemia, unspecified    Incisional hernia without obstruction or gangrene    Lumbago    Nonspecific elevation of levels of transaminase and lactic acid dehydrogenase (LDH)    Other male erectile dysfunction    Other obstructive and reflux uropathy    Radiculopathy, lumbar region    Rash and other nonspecific skin eruption    Reflux esophagitis     Surgical History: Past Surgical History:  Procedure Laterality Date   COLONOSCOPY  12/29/2005   [id number] hemorrhoids, anal papilla/Diminutive polyp on a stalk at 10 cm/otherwise normal rectum and colon, PATH: hyperplastic polyp   COLONOSCOPY N/A 04/10/2013   RMR: tubular adenoma: next TCS 04/2018   COLONOSCOPY WITH PROPOFOL N/A 02/10/2018   Procedure: COLONOSCOPY WITH  PROPOFOL;  Surgeon: Daneil Dolin, MD;  [facility];  Service: Endoscopy;  Laterality: N/A;   ESOPHAGOGASTRODUODENOSCOPY  12/29/2005   [id number] Schatzki's ring, small hiatal hernia, normal gastric mucosa, normal D1 and D2.    HAND SURGERY     bilateral, got caught in a machine, two different occasions, right hand originally, than left hand. Left hand with skin graft   SPINAL CORD DECOMPRESSION      Home Medications:  Allergies as of 11/24/2021   No Known Allergies      Medication List        Accurate as of November 24, 2021  9:04 AM. If you have any questions, ask your nurse or doctor.          ALKA-SELTZER PLUS COLD PO Take 2 tablets by mouth daily as needed (cold symptoms).   Fluzone High-Dose Quadrivalent 0.7 ML Susy Generic drug: Influenza Vac High-Dose Quad   multivitamins ther. w/minerals Tabs tablet Take 1 tablet by mouth daily.   omeprazole 20 MG capsule Commonly known as: PRILOSEC Take 1 capsule (20 mg total) by mouth daily.   polyethylene glycol 17 g packet Commonly known as: MIRALAX / GLYCOLAX Take 17 g by mouth every other day.   tamsulosin 0.4 MG Caps capsule Commonly known as: FLOMAX Take 0.4 mg  by mouth.        Allergies: No Known Allergies  Family History: Family History  Problem Relation Age of Onset   CAD Brother     Social History:  reports that he has quit smoking. He has never used smokeless tobacco. He reports that he does not drink alcohol and does not use drugs.   Physical Exam: BP 126/84    Pulse 98    Wt 155 lb (70.3 kg)    BMI 21.62 kg/m   Constitutional:  Alert and oriented, No acute distress. HEENT: [place] AT, moist mucus membranes.  Trachea midline, no masses. Cardiovascular: No clubbing, cyanosis, or edema. Respiratory: Normal respiratory effort, no increased work of breathing. GI: Abdomen is soft, nontender, nondistended, no abdominal masses Skin: No rashes, bruises or suspicious lesions. Neurologic:  Grossly intact, no focal deficits, moving all 4 extremities. Psychiatric: Normal mood and affect.  Laboratory Data: Lab Results  Component Value Date   WBC 5.2 12/16/2020   HGB 14.6 12/16/2020   HCT 45.8 12/16/2020   MCV 88.1 12/16/2020   PLT 168 12/16/2020    Lab Results  Component Value Date   CREATININE 1.13 12/16/2020    Lab Results  Component Value Date   PSA 3.9 05/23/2019    No results found for: TESTOSTERONE  Lab Results  Component Value Date   HGBA1C 6.2 07/12/2015    Urinalysis    Component Value Date/Time   COLORURINE YELLOW 05/02/2017 1402   APPEARANCEUR Clear 04/21/2021 1111   LABSPEC 1.010 05/02/2017 1402   PHURINE 5.5 05/02/2017 1402   GLUCOSEU Negative 04/21/2021 1111   HGBUR NEGATIVE 05/02/2017 1402   BILIRUBINUR Negative 04/21/2021 1111   KETONESUR NEGATIVE 05/02/2017 1402   PROTEINUR Negative 04/21/2021 1111   PROTEINUR NEGATIVE 05/02/2017 1402   UROBILINOGEN 0.2 01/29/2014 2300   NITRITE Negative 04/21/2021 1111   NITRITE NEGATIVE 05/02/2017 1402   LEUKOCYTESUR Negative 04/21/2021 1111    Lab Results  Component Value Date   LABMICR See below: 04/21/2021   WBCUA None seen 04/21/2021   LABEPIT None seen 04/21/2021   MUCUS Present 04/21/2021   BACTERIA None seen 04/21/2021    Pertinent Imaging: N/a  Assessment & Plan:    1. Weak urinary stream Cont tamsulosin  - Urinalysis, Routine w reflex microscopic  2. BPH with obstruction/lower urinary tract symptoms - Urinalysis, Routine w reflex microscopic  3. PSA elevation - disc nature of PSA elevation - benign vs malignant. PSAD remains normal but upper end and +FH PCa. Discussed nature r/b/a to prostate MRI. He will proceed. ALso discussed management of potential MRI findings with AS vs bx and management of PCa with AS vs treatment.    No follow-ups on file.  Festus Aloe, MD  [facility]  [address] [place],  [address] [phone]

## 2021-12-10 ENCOUNTER — Other Ambulatory Visit: Payer: Self-pay

## 2021-12-10 ENCOUNTER — Ambulatory Visit (HOSPITAL_COMMUNITY)
Admission: RE | Admit: 2021-12-10 | Discharge: 2021-12-10 | Disposition: A | Discharge status: Home / Self Care | Payer: PPO | Source: Ambulatory Visit | Attending: Urology | Admitting: Urology

## 2021-12-10 DIAGNOSIS — R972 Elevated prostate specific antigen [PSA]: Secondary | ICD-10-CM | POA: Insufficient documentation

## 2021-12-10 DIAGNOSIS — Z8042 Family history of malignant neoplasm of prostate: Secondary | ICD-10-CM | POA: Diagnosis not present

## 2021-12-10 DIAGNOSIS — Z8546 Personal history of malignant neoplasm of prostate: Secondary | ICD-10-CM | POA: Diagnosis not present

## 2021-12-10 DIAGNOSIS — N402 Nodular prostate without lower urinary tract symptoms: Secondary | ICD-10-CM | POA: Diagnosis not present

## 2021-12-10 IMAGING — MR MR PROSTATE WO/W CM
12 series · 48 of 48 positions shown · IV contrast (GADAVIST)
Comparison: None

CLINICAL DATA: Elevated PSA, suspected prostate cancer in a
70-year-old male with history of negative biopsies in the past. Most
recent PSA of 6.0. Also with family history of prostate cancer.

EXAM:
MR PROSTATE WITHOUT AND WITH CONTRAST
TECHNIQUE: Multiplanar multisequence MRI images were obtained of the pelvis
centered about the prostate. Pre and post contrast images were
obtained.
CONTRAST:  7mL GADAVIST GADOBUTROL 1 MMOL/ML IV SOLN

[Series 4: T1 · axial · 5.0mm · 1.12mm/px · 1 of 72 slices shown (1 of 2)]
[im 1/72]
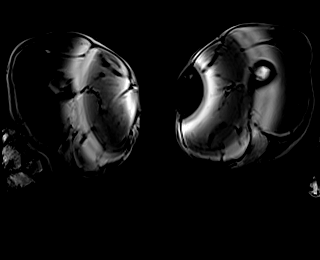

[Series 5: T1 · axial · 5.0mm · 1.12mm/px · 1 of 72 slices shown (2 of 2)]
[im 1/72]
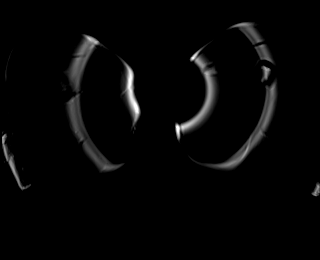

[Series 6: T2 · axial · 3.0mm · 0.50mm/px · 1 of 25 slices shown (1 of 3)]
[im 1/25]
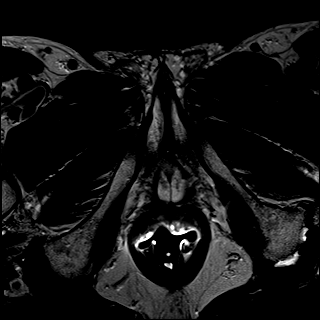

[Series 7: T2 · coronal · 3.0mm · 0.47mm/px · 1 of 24 slices shown (2 of 3)]
[im 1/24]
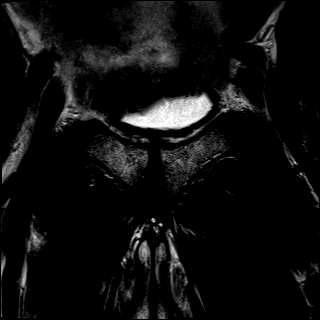

[Series 8: T2 · axial · 1.0mm · 1.00mm/px · z∈[-19,+44]mm · 2 of 64 slices shown (3 of 3)]
[im 1/64]
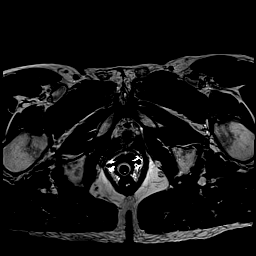
[im 64/64]
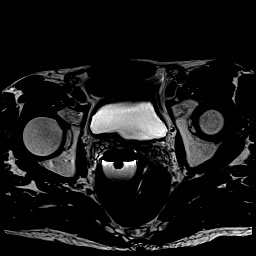

[Series 9: ep2d_diff_b100_500_800_tra_endo**_tracew_dfc_mix · axial · 3.0mm · 1.60mm/px · z∈[-15,+48]mm · 2 of 62 slices shown]
[im 1/62]
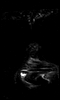
[im 62/62]
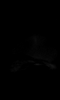

[Series 10: ep2d_diff_b100_500_800_tra_endo**_adc_dfc_mix · axial · 3.0mm · 1.60mm/px · 1 of 25 slices shown]
[im 1/25]
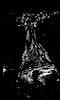

[Series 11: ep2d_diff_b100_500_800_tra_endo**_calc_bval_dfc_mix · axial · 3.0mm · 1.60mm/px · 1 of 25 slices shown]
[im 1/25]
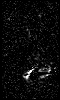

[Series 12: ep2d_diff_bvalue (id) · axial · 3.0mm · 1.60mm/px · 1 of 25 slices shown]
[im 1/25]
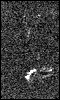

[Series 13: axial multiphase · axial · 3.0mm · 0.98mm/px · z∈[-30,+63]mm · 18 of 640 slices shown]
[im 1/640]
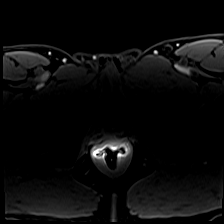
[im 38/640]
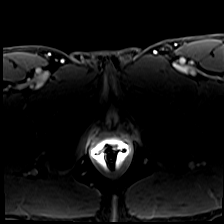
[im 76/640]
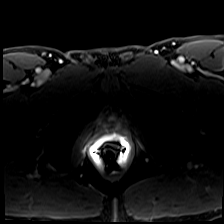
[im 113/640]
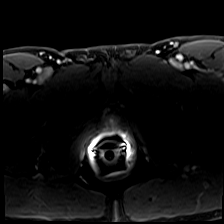
[im 151/640]
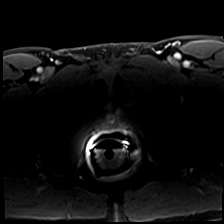
[im 188/640]
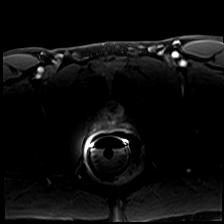
[im 226/640]
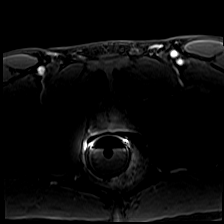
[im 264/640]
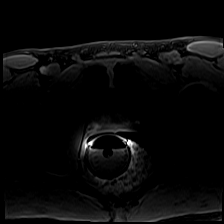
[im 301/640]
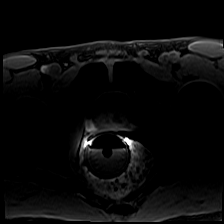
[im 339/640]
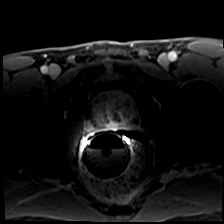
[im 376/640]
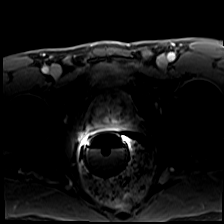
[im 414/640]
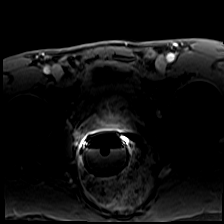
[im 452/640]
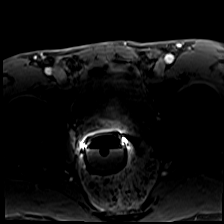
[im 489/640]
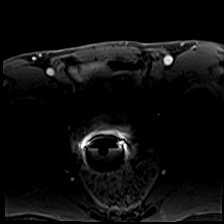
[im 527/640]
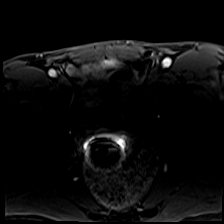
[im 564/640]
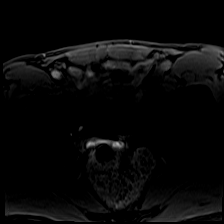
[im 602/640]
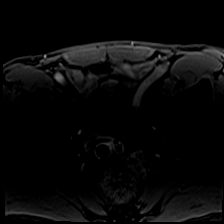
[im 640/640]
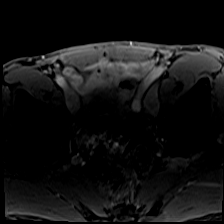

[Series 14: axial multiphase_sub · axial · 3.0mm · 0.98mm/px · z∈[-30,+63]mm · 17 of 608 slices shown]
[im 1/608]
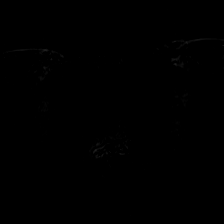
[im 38/608]
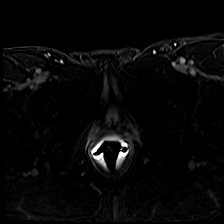
[im 76/608]
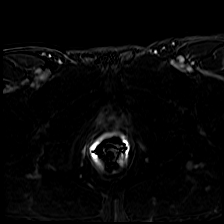
[im 114/608]
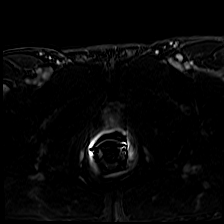
[im 152/608]
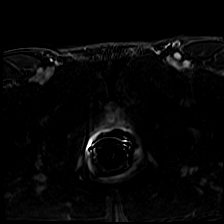
[im 190/608]
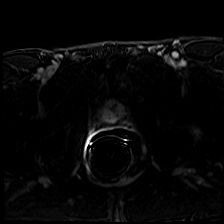
[im 228/608]
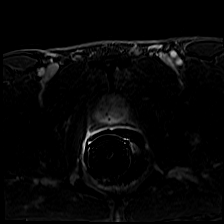
[im 266/608]
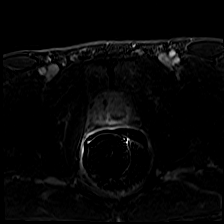
[im 304/608]
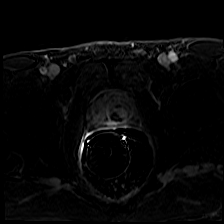
[im 342/608]
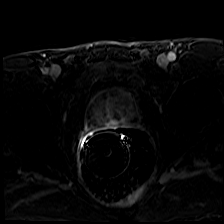
[im 380/608]
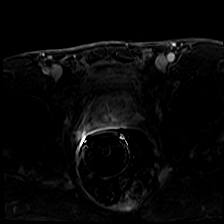
[im 418/608]
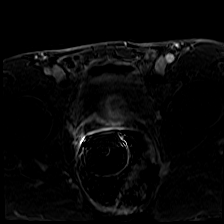
[im 456/608]
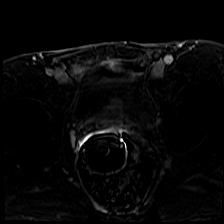
[im 494/608]
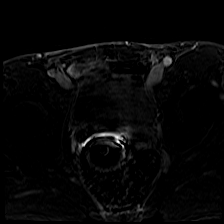
[im 532/608]
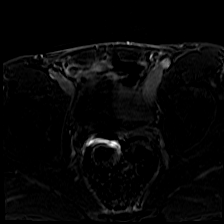
[im 570/608]
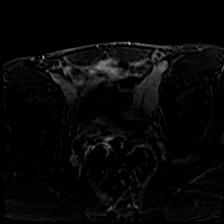
[im 608/608]
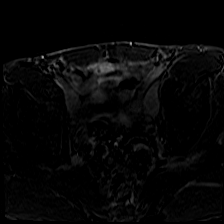

[Series 16: iliac crest thru · axial · 2.5mm · 1.12mm/px · z∈[-53,+164]mm · 2 of 88 slices shown]
[im 1/88]
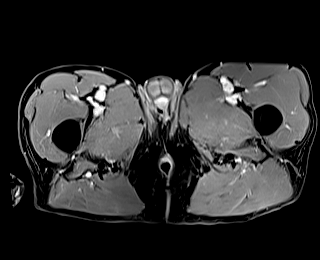
[im 88/88]
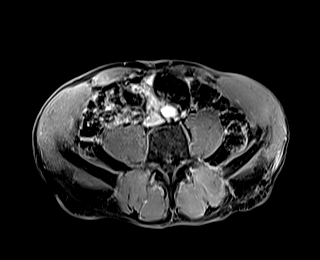

[48 of 48 positions shown; findings below may reference images not displayed]

FINDINGS: Prostate: Diffusion is limited by abundant stool and some mixed gas
in the rectum and the adjacent endorectal coil. Baseline T1
hyperintensity noted at the RIGHT prostate base perhaps from prior
biopsy with scattered areas in the peripheral zone elsewhere. No
areas of suspicious enhancement.

Transitional zone: BPH nodules in the transitional zone. Partially
extruded nodule at the LEFT apex extending inferiorly from the
gland. No high-risk lesion in the transitional zone.

Peripheral zone: Linear and wedge-shaped areas of T2 hypointensity
throughout the peripheral zone. No high-risk lesion in the
peripheral zone.

Volume: 5.5 x 3.6 x 4.5 (volume = 47 cc) cm

Transcapsular spread:  Absent

Seminal vesicle involvement: Absent

Neurovascular bundle involvement: Absent

Pelvic adenopathy: Absent

Bone metastasis: Absent

Other findings: None aside from incidental findings of abundant
stool in the colon with limitations secondary to susceptibility
artifact discussed above.
IMPRESSION: 1. No evidence of high-risk lesion.
2. Changes of BPH and mild changes of presumed sequela of prior
prostatitis. Overall assessment PIRADS category 2.

## 2021-12-10 MED ORDER — GADOBUTROL 1 MMOL/ML IV SOLN
7.0000 mL | Freq: Once | INTRAVENOUS | Status: AC | PRN
  Administered 2021-12-10: 7 mL via INTRAVENOUS

## 2021-12-22 NOTE — Progress Notes (Signed)
Let patient know his prostate MRI did not show any prostate cancer which is great.  That being said, his PSA is a bit high for his prostate size so I need to see him back in about 3 months with a PSA prior.  Thanks.

## 2022-01-21 DIAGNOSIS — H6980 Other specified disorders of Eustachian tube, unspecified ear: Secondary | ICD-10-CM | POA: Diagnosis not present

## 2022-01-21 DIAGNOSIS — E663 Overweight: Secondary | ICD-10-CM | POA: Diagnosis not present

## 2022-01-21 DIAGNOSIS — Z6823 Body mass index (BMI) 23.0-23.9, adult: Secondary | ICD-10-CM | POA: Diagnosis not present

## 2022-01-21 DIAGNOSIS — H6590 Unspecified nonsuppurative otitis media, unspecified ear: Secondary | ICD-10-CM | POA: Diagnosis not present

## 2022-02-12 ENCOUNTER — Ambulatory Visit
Admission: EM | Admit: 2022-02-12 | Discharge: 2022-02-12 | Disposition: A | Discharge status: Home / Self Care | Payer: PPO | Attending: Urgent Care | Admitting: Urgent Care

## 2022-02-12 ENCOUNTER — Other Ambulatory Visit: Payer: Self-pay

## 2022-02-12 DIAGNOSIS — U071 COVID-19: Secondary | ICD-10-CM | POA: Diagnosis not present

## 2022-02-12 DIAGNOSIS — J019 Acute sinusitis, unspecified: Secondary | ICD-10-CM

## 2022-02-12 MED ORDER — LEVOCETIRIZINE DIHYDROCHLORIDE 5 MG PO TABS: 5.0000 mg | ORAL_TABLET | Freq: Every evening | ORAL | 0 refills | Status: DC

## 2022-02-12 MED ORDER — AMOXICILLIN-POT CLAVULANATE 875-125 MG PO TABS: 1.0000 | ORAL_TABLET | Freq: Two times a day (BID) | ORAL | 0 refills | Status: DC

## 2022-02-12 MED ORDER — PROMETHAZINE-DM 6.25-15 MG/5ML PO SYRP: 5.0000 mL | ORAL_SOLUTION | Freq: Every evening | ORAL | 0 refills | Status: DC | PRN

## 2022-02-12 MED ORDER — BENZONATATE 100 MG PO CAPS: 100.0000 mg | ORAL_CAPSULE | Freq: Three times a day (TID) | ORAL | 0 refills | Status: DC | PRN

## 2022-02-12 NOTE — ED Triage Notes (Signed)
Pt states he has had a cough for past 2 weeks and tested positive for covid last night  ?

## 2022-02-12 NOTE — ED Provider Notes (Signed)
?[facility] ? ? ?MRN: [id number] DOB: [birth year] ? ?Subjective:  ? ?Daniel Duffy is a 71 y.o. male presenting for 2-week history of persistent coughing, persistent sinus congestion and postnasal drainage.  Patient tested for COVID-19 last night and was positive.  He does have a history of sinusitis.  Does not take any medications for this.  No history of CKD.  No chest pain, shortness of breath or wheezing, chest congestion.  He is not a smoker.  No history of asthma. ? ?No current facility-administered medications for this encounter. ? ?Current Outpatient Medications:  ?  Chlorphen-Phenyleph-ASA (ALKA-SELTZER PLUS COLD PO), Take 2 tablets by mouth daily as needed (cold symptoms)., Disp: , Rfl:  ?  FLUZONE HIGH-DOSE QUADRIVALENT 0.7 ML SUSY, , Disp: , Rfl:  ?  Multiple Vitamins-Minerals (MULTIVITAMINS THER. W/MINERALS) TABS, Take 1 tablet by mouth daily., Disp: , Rfl:  ?  omeprazole (PRILOSEC) 20 MG capsule, Take 1 capsule (20 mg total) by mouth daily., Disp: 30 capsule, Rfl: 11 ?  polyethylene glycol (MIRALAX / GLYCOLAX) packet, Take 17 g by mouth every other day., Disp: , Rfl:  ?  tamsulosin (FLOMAX) 0.4 MG CAPS capsule, Take 0.4 mg by mouth., Disp: , Rfl:   ? ?Allergies  ?Allergen Reactions  ? Nitrates, Organic   ? ? ?Past Medical History:  ?Diagnosis Date  ? Abnormal weight loss   ? Acid reflux   ? Acute prostatitis   ? Acute sinusitis, unspecified   ? Chronic idiopathic constipation   ? Colon polyps   ? Enlarged prostate   ? Hyperlipidemia, unspecified   ? Incisional hernia without obstruction or gangrene   ? Lumbago   ? Nonspecific elevation of levels of transaminase and lactic acid dehydrogenase (LDH)   ? Other male erectile dysfunction   ? Other obstructive and reflux uropathy   ? Radiculopathy, lumbar region   ? Rash and other nonspecific skin eruption   ? Reflux esophagitis   ?  ? ?Past Surgical History:  ?Procedure Laterality Date  ? COLONOSCOPY  12/29/2005  ? INO:[id number] hemorrhoids,  anal papilla/Diminutive polyp on a stalk at 10 cm/otherwise normal rectum and colon, PATH: hyperplastic polyp  ? COLONOSCOPY N/A 04/10/2013  ? RMR: tubular adenoma: next TCS 04/2018  ? COLONOSCOPY WITH PROPOFOL N/A 02/10/2018  ? Procedure: COLONOSCOPY WITH PROPOFOL;  Surgeon: Daneil Dolin, MD;  [facility];  Service: Endoscopy;  Laterality: N/A;  ? ESOPHAGOGASTRODUODENOSCOPY  12/29/2005  ? [id number] Schatzki's ring, small hiatal hernia, normal gastric mucosa, normal D1 and D2.   ? HAND SURGERY    ? bilateral, got caught in a machine, two different occasions, right hand originally, than left hand. Left hand with skin graft  ? SPINAL CORD DECOMPRESSION    ? ? ?Family History  ?Problem Relation Age of Onset  ? CAD Brother   ? ? ?Social History  ? ?Tobacco Use  ? Smoking status: Former  ? Smokeless tobacco: Never  ?Vaping Use  ? Vaping Use: Never used  ?Substance Use Topics  ? Alcohol use: No  ? Drug use: No  ? ? ?ROS ? ? ?Objective:  ? ?Vitals: ?BP 124/82   Pulse 84   Temp 98 ?F (36.7 ?C)   Resp 18   SpO2 95%  ? ?Physical Exam ?Constitutional:   ?   General: He is not in acute distress. ?   Appearance: Normal appearance. He is well-developed and normal weight. He is not ill-appearing, toxic-appearing or diaphoretic.  ?HENT:  ?  Head: Normocephalic and atraumatic.  ?   Right Ear: External ear normal.  ?   Left Ear: External ear normal.  ?   Nose: Congestion and rhinorrhea present.  ?   Mouth/Throat:  ?   Mouth: Mucous membranes are moist.  ?Eyes:  ?   General: No scleral icterus.    ?   Right eye: No discharge.     ?   Left eye: No discharge.  ?   Extraocular Movements: Extraocular movements intact.  ?Cardiovascular:  ?   Rate and Rhythm: Normal rate and regular rhythm.  ?   Heart sounds: Normal heart sounds. No murmur heard. ?  No friction rub. No gallop.  ?Pulmonary:  ?   Effort: Pulmonary effort is normal. No respiratory distress.  ?   Breath sounds: Normal breath sounds. No stridor. No wheezing,  rhonchi or rales.  ?Musculoskeletal:  ?   Cervical back: Normal range of motion.  ?Neurological:  ?   Mental Status: He is alert and oriented to person, place, and time.  ?Psychiatric:     ?   Mood and Affect: Mood normal.     ?   Behavior: Behavior normal.     ?   Thought Content: Thought content normal.     ?   Judgment: Judgment normal.  ? ? ?Assessment and Plan :  ? ?PDMP not reviewed this encounter. ? ?1. Acute non-recurrent sinusitis, unspecified location   ?2. COVID-19   ? ?Deferred imaging given clear cardiopulmonary exam, hemodynamically stable vital signs.  At 2 weeks, he no longer has to quarantine and just needs supportive care.  However, I will address a secondary sinusitis with Augmentin.  Recommend supportive care otherwise. Counseled patient on potential for adverse effects with medications prescribed/recommended today, ER and return-to-clinic precautions discussed, patient verbalized understanding. ? ?  ?Jaynee Eagles, PA-C ?02/12/22 [id number] ? ?

## 2022-03-16 ENCOUNTER — Other Ambulatory Visit: Payer: PPO

## 2022-03-16 DIAGNOSIS — Z0001 Encounter for general adult medical examination with abnormal findings: Secondary | ICD-10-CM | POA: Diagnosis not present

## 2022-03-16 DIAGNOSIS — M5416 Radiculopathy, lumbar region: Secondary | ICD-10-CM | POA: Diagnosis not present

## 2022-03-16 DIAGNOSIS — Z6822 Body mass index (BMI) 22.0-22.9, adult: Secondary | ICD-10-CM | POA: Diagnosis not present

## 2022-03-16 DIAGNOSIS — Z1331 Encounter for screening for depression: Secondary | ICD-10-CM | POA: Diagnosis not present

## 2022-03-17 ENCOUNTER — Other Ambulatory Visit: Payer: PPO

## 2022-03-17 DIAGNOSIS — R972 Elevated prostate specific antigen [PSA]: Secondary | ICD-10-CM

## 2022-03-18 LAB — PSA: Prostate Specific Ag, Serum: 8.7 ng/mL — ABNORMAL HIGH (ref 0.0–4.0)

## 2022-03-23 ENCOUNTER — Encounter: Payer: Self-pay | Admitting: Urology

## 2022-03-23 ENCOUNTER — Ambulatory Visit: Payer: PPO | Admitting: Urology

## 2022-03-23 VITALS — BP 136/75 | HR 91 | Wt 159.0 lb

## 2022-03-23 DIAGNOSIS — R3912 Poor urinary stream: Secondary | ICD-10-CM | POA: Diagnosis not present

## 2022-03-23 DIAGNOSIS — N138 Other obstructive and reflux uropathy: Secondary | ICD-10-CM

## 2022-03-23 DIAGNOSIS — N401 Enlarged prostate with lower urinary tract symptoms: Secondary | ICD-10-CM

## 2022-03-23 DIAGNOSIS — R972 Elevated prostate specific antigen [PSA]: Secondary | ICD-10-CM

## 2022-03-23 NOTE — Progress Notes (Signed)
? ?03/23/2022 ?9:22 AM  ? ?Daniel Duffy ?12/22/1950 ?[id number] ? ?Referring provider: Cory Munch, PA-C ?[address] ?[address] ?[place],  [place] [address] ? ?No chief complaint on file. ? ? ?HPI: ?F/u -  ? ?1) PSA elevation -a 10/18 PSA was 4.4 and recheck 3.03. His 06/20 PSA was 3.9 but 04/22 PSA 6.9, hematocrit 45, testosterone 1364 (he has a history of low testosterone. He doesn't recall or list T replacement). Prostate biopsy 06/22 benign with 53 gram prostate and a PSA of 5.7.  ? ?His 12/22 PSA was 6.0 (psad 0.11), so we went ahead and set up a Jan 2023 prostate MRI which was benign (PIRADS 2) with a 47 g prostate.  ? ?His PSA has increased Apr 2023 to 8.7. He thinks he had sex prior.  ? ?His brother had PCa and he recalls what sounds like XRT. ?  ?2)  BPH on tamsulosin. Prostate 47 - 53 g on imaging. PVR was 11.  Neurogenic risk includes spinal cord surgery.  He has some urgency and nocturia.  AUASS = 4-6. He takes tamsulosin.  ?  ?3) ED - His prior SHIM was 10 and he tried sildenafil. He saw Dr. Louis Meckel in [facility] 2018 - 2019. He loses an erection too soon.  ?  ?  ?Today, seen for the above.  ? ? ?PMH: ?Past Medical History:  ?Diagnosis Date  ? Abnormal weight loss   ? Acid reflux   ? Acute prostatitis   ? Acute sinusitis, unspecified   ? Chronic idiopathic constipation   ? Colon polyps   ? Enlarged prostate   ? Hyperlipidemia, unspecified   ? Incisional hernia without obstruction or gangrene   ? Lumbago   ? Nonspecific elevation of levels of transaminase and lactic acid dehydrogenase (LDH)   ? Other male erectile dysfunction   ? Other obstructive and reflux uropathy   ? Radiculopathy, lumbar region   ? Rash and other nonspecific skin eruption   ? Reflux esophagitis   ? ? ?Surgical History: ?Past Surgical History:  ?Procedure Laterality Date  ? COLONOSCOPY  12/29/2005  ? [id number] hemorrhoids, anal papilla/Diminutive polyp on a stalk at 10 cm/otherwise normal rectum and colon, PATH: hyperplastic polyp   ? COLONOSCOPY N/A 04/10/2013  ? RMR: tubular adenoma: next TCS 04/2018  ? COLONOSCOPY WITH PROPOFOL N/A 02/10/2018  ? Procedure: COLONOSCOPY WITH PROPOFOL;  Surgeon: Daneil Dolin, MD;  [facility];  Service: Endoscopy;  Laterality: N/A;  ? ESOPHAGOGASTRODUODENOSCOPY  12/29/2005  ? [id number] Schatzki's ring, small hiatal hernia, normal gastric mucosa, normal D1 and D2.   ? HAND SURGERY    ? bilateral, got caught in a machine, two different occasions, right hand originally, than left hand. Left hand with skin graft  ? SPINAL CORD DECOMPRESSION    ? ? ?Home Medications:  ?Allergies as of 03/23/2022   ? ?   Reactions  ? Nitrates, Organic   ? ?  ? ?  ?Medication List  ?  ? ?  ? Accurate as of March 23, 2022  9:22 AM. If you have any questions, ask your nurse or doctor.  ?  ?  ? ?  ? ?STOP taking these medications   ? ?ALKA-SELTZER PLUS COLD PO ?  ?amoxicillin-clavulanate 875-125 MG tablet ?Commonly known as: AUGMENTIN ?  ?benzonatate 100 MG capsule ?Commonly known as: TESSALON ?  ?promethazine-dextromethorphan 6.25-15 MG/5ML syrup ?Commonly known as: PROMETHAZINE-DM ?  ? ?  ? ?TAKE these medications   ? ?Fluzone  High-Dose Quadrivalent 0.7 ML Susy ?Generic drug: Influenza Vac High-Dose Quad ?  ?levocetirizine 5 MG tablet ?Commonly known as: XYZAL ?Take 1 tablet (5 mg total) by mouth every evening. ?  ?multivitamins ther. w/minerals Tabs tablet ?Take 1 tablet by mouth daily. ?  ?omeprazole 20 MG capsule ?Commonly known as: PRILOSEC ?Take 1 capsule (20 mg total) by mouth daily. ?  ?polyethylene glycol 17 g packet ?Commonly known as: MIRALAX / GLYCOLAX ?Take 17 g by mouth every other day. ?  ?tamsulosin 0.4 MG Caps capsule ?Commonly known as: FLOMAX ?Take 0.4 mg by mouth. ?  ? ?  ? ? ?Allergies:  ?Allergies  ?Allergen Reactions  ? Nitrates, Organic   ? ? ?Family History: ?Family History  ?Problem Relation Age of Onset  ? CAD Brother   ? ? ?Social History:  reports that he has quit smoking. He has never used  smokeless tobacco. He reports that he does not drink alcohol and does not use drugs. ? ? ?Physical Exam: ?BP 136/75   Pulse 91   Wt 159 lb (72.1 kg)   BMI 22.18 kg/m?   ?Constitutional:  Alert and oriented, No acute distress. ?HEENT: [place] AT, moist mucus membranes.  Trachea midline, no masses. ?Cardiovascular: No clubbing, cyanosis, or edema. ?Respiratory: Normal respiratory effort, no increased work of breathing. ?GI: Abdomen is soft, nontender, nondistended, no abdominal masses ?GU: No CVA tenderness ?Lymph: No cervical or inguinal lymphadenopathy. ?Skin: No rashes, bruises or suspicious lesions. ?Neurologic: Grossly intact, no focal deficits, moving all 4 extremities. ?Psychiatric: Normal mood and affect. ?DRE: benign exam today - prostate about 50 g and smooth. No hard area or nodule. Easy exam.  ? ?Laboratory Data: ?Lab Results  ?Component Value Date  ? WBC 5.2 12/16/2020  ? HGB 14.6 12/16/2020  ? HCT 45.8 12/16/2020  ? MCV 88.1 12/16/2020  ? PLT 168 12/16/2020  ? ? ?Lab Results  ?Component Value Date  ? CREATININE 1.13 12/16/2020  ? ? ?Lab Results  ?Component Value Date  ? PSA 3.9 05/23/2019  ? ? ?No results found for: TESTOSTERONE ? ?Lab Results  ?Component Value Date  ? HGBA1C 6.2 07/12/2015  ? ? ?Urinalysis ?   ?Component Value Date/Time  ? [place] 05/02/2017 1402  ? APPEARANCEUR Clear 11/24/2021 0906  ? LABSPEC 1.010 05/02/2017 1402  ? PHURINE 5.5 05/02/2017 1402  ? GLUCOSEU Negative 11/24/2021 0906  ? [place] NEGATIVE 05/02/2017 1402  ? BILIRUBINUR Negative 11/24/2021 0906  ? [place] NEGATIVE 05/02/2017 1402  ? PROTEINUR Negative 11/24/2021 0906  ? PROTEINUR NEGATIVE 05/02/2017 1402  ? UROBILINOGEN 0.2 01/29/2014 2300  ? NITRITE Negative 11/24/2021 0906  ? NITRITE NEGATIVE 05/02/2017 1402  ? LEUKOCYTESUR Negative 11/24/2021 0906  ? ? ?Lab Results  ?Component Value Date  ? LABMICR Comment 11/24/2021  ? [place] None seen 04/21/2021  ? LABEPIT None seen 04/21/2021  ? MUCUS Present 04/21/2021  ?  BACTERIA None seen 04/21/2021  ? ? ?Pertinent Imaging: ?Prostate MRI - images reviewed  ? ? ?Assessment & Plan:   ? ?1. Elevated PSA ?Benign exam - increase possibly related to inflammation (recent covid) or ejaculation. Check PSa in 1 mo and then repeat prostate bx if it remains higher than his previous levels. If PSA lower, will see back for repeat PSA in 6 months.  ? ?- Urinalysis, Routine w reflex microscopic ? ?2. BPH - continue tamsulosin  ? ?No follow-ups on file. ? ?Festus Aloe, MD ? ?[facility]  ?[place],  [place] [address] ?[phone] ? ? ?

## 2022-03-24 LAB — URINALYSIS, ROUTINE W REFLEX MICROSCOPIC
Bilirubin, UA: NEGATIVE
Glucose, UA: NEGATIVE
Leukocytes,UA: NEGATIVE
Nitrite, UA: NEGATIVE
Protein,UA: NEGATIVE
RBC, UA: NEGATIVE
Specific Gravity, UA: 1.025 (ref 1.005–1.030)
Urobilinogen, Ur: 0.2 mg/dL (ref 0.2–1.0)
pH, UA: 5.5 (ref 5.0–7.5)

## 2022-04-28 ENCOUNTER — Other Ambulatory Visit: Payer: PPO

## 2022-04-28 DIAGNOSIS — R972 Elevated prostate specific antigen [PSA]: Secondary | ICD-10-CM

## 2022-04-29 LAB — PSA, TOTAL AND FREE
PSA, Free Pct: 24.5 %
PSA, Free: 1.35 ng/mL
Prostate Specific Ag, Serum: 5.5 ng/mL — ABNORMAL HIGH (ref 0.0–4.0)

## 2022-04-29 NOTE — Progress Notes (Signed)
Patient called with no answer and unable to leave voicemail. Letter sent via mail.

## 2022-07-28 DIAGNOSIS — K5792 Diverticulitis of intestine, part unspecified, without perforation or abscess without bleeding: Secondary | ICD-10-CM | POA: Diagnosis not present

## 2022-07-28 DIAGNOSIS — R1030 Lower abdominal pain, unspecified: Secondary | ICD-10-CM | POA: Diagnosis not present

## 2022-07-28 DIAGNOSIS — N411 Chronic prostatitis: Secondary | ICD-10-CM | POA: Diagnosis not present

## 2022-07-28 DIAGNOSIS — Z6821 Body mass index (BMI) 21.0-21.9, adult: Secondary | ICD-10-CM | POA: Diagnosis not present

## 2022-07-28 DIAGNOSIS — M5416 Radiculopathy, lumbar region: Secondary | ICD-10-CM | POA: Diagnosis not present

## 2022-07-30 ENCOUNTER — Emergency Department (HOSPITAL_COMMUNITY)
Admission: EM | Admit: 2022-07-30 | Discharge: 2022-07-30 | Disposition: A | Discharge status: Home / Self Care | Payer: PPO | Attending: Emergency Medicine | Admitting: Emergency Medicine

## 2022-07-30 ENCOUNTER — Emergency Department (HOSPITAL_COMMUNITY): Payer: PPO

## 2022-07-30 ENCOUNTER — Encounter (HOSPITAL_COMMUNITY): Payer: Self-pay

## 2022-07-30 ENCOUNTER — Other Ambulatory Visit: Payer: Self-pay

## 2022-07-30 DIAGNOSIS — R1030 Lower abdominal pain, unspecified: Secondary | ICD-10-CM | POA: Diagnosis not present

## 2022-07-30 DIAGNOSIS — M549 Dorsalgia, unspecified: Secondary | ICD-10-CM | POA: Insufficient documentation

## 2022-07-30 DIAGNOSIS — R1084 Generalized abdominal pain: Secondary | ICD-10-CM | POA: Insufficient documentation

## 2022-07-30 DIAGNOSIS — R109 Unspecified abdominal pain: Secondary | ICD-10-CM | POA: Diagnosis present

## 2022-07-30 DIAGNOSIS — K5900 Constipation, unspecified: Secondary | ICD-10-CM | POA: Diagnosis not present

## 2022-07-30 DIAGNOSIS — R9431 Abnormal electrocardiogram [ECG] [EKG]: Secondary | ICD-10-CM | POA: Diagnosis not present

## 2022-07-30 DIAGNOSIS — N400 Enlarged prostate without lower urinary tract symptoms: Secondary | ICD-10-CM | POA: Diagnosis not present

## 2022-07-30 LAB — CBC
HCT: 38.3 % — ABNORMAL LOW (ref 39.0–52.0)
Hemoglobin: 12.4 g/dL — ABNORMAL LOW (ref 13.0–17.0)
MCH: 28.1 pg (ref 26.0–34.0)
MCHC: 32.4 g/dL (ref 30.0–36.0)
MCV: 86.8 fL (ref 80.0–100.0)
Platelets: 188 10*3/uL (ref 150–400)
RBC: 4.41 MIL/uL (ref 4.22–5.81)
RDW: 14.6 % (ref 11.5–15.5)
WBC: 6.3 10*3/uL (ref 4.0–10.5)
nRBC: 0 % (ref 0.0–0.2)

## 2022-07-30 LAB — URINALYSIS, ROUTINE W REFLEX MICROSCOPIC
Bacteria, UA: NONE SEEN
Bilirubin Urine: NEGATIVE
Glucose, UA: NEGATIVE mg/dL
Ketones, ur: NEGATIVE mg/dL
Nitrite: NEGATIVE
Protein, ur: NEGATIVE mg/dL
Specific Gravity, Urine: 1.011 (ref 1.005–1.030)
pH: 6 (ref 5.0–8.0)

## 2022-07-30 LAB — COMPREHENSIVE METABOLIC PANEL
ALT: 24 U/L (ref 0–44)
AST: 26 U/L (ref 15–41)
Albumin: 3.2 g/dL — ABNORMAL LOW (ref 3.5–5.0)
Alkaline Phosphatase: 65 U/L (ref 38–126)
Anion gap: 4 — ABNORMAL LOW (ref 5–15)
BUN: 11 mg/dL (ref 8–23)
CO2: 26 mmol/L (ref 22–32)
Calcium: 11.7 mg/dL — ABNORMAL HIGH (ref 8.9–10.3)
Chloride: 103 mmol/L (ref 98–111)
Creatinine, Ser: 1.12 mg/dL (ref 0.61–1.24)
GFR, Estimated: >60 mL/min (ref >60)
Glucose, Bld: 142 mg/dL — ABNORMAL HIGH (ref 70–99)
Potassium: 4.7 mmol/L (ref 3.5–5.1)
Sodium: 133 mmol/L — ABNORMAL LOW (ref 135–145)
Total Bilirubin: 0.5 mg/dL (ref 0.3–1.2)
Total Protein: 7 g/dL (ref 6.5–8.1)

## 2022-07-30 LAB — LIPASE, BLOOD: Lipase: 25 U/L (ref 11–51)

## 2022-07-30 MED ORDER — POLYETHYLENE GLYCOL 3350 17 G PO PACK: 17.0000 g | PACK | Freq: Every day | ORAL | 0 refills | Status: DC

## 2022-07-30 MED ORDER — DOCUSATE SODIUM 250 MG PO CAPS: 250.0000 mg | ORAL_CAPSULE | Freq: Every day | ORAL | 0 refills | Status: DC

## 2022-07-30 MED ORDER — SODIUM CHLORIDE 0.9 % IV BOLUS
500.0000 mL | Freq: Once | INTRAVENOUS | Status: AC
  Administered 2022-07-30: 500 mL via INTRAVENOUS

## 2022-07-30 MED ORDER — IOHEXOL 300 MG/ML  SOLN
100.0000 mL | Freq: Once | INTRAMUSCULAR | Status: AC | PRN
  Administered 2022-07-30: 100 mL via INTRAVENOUS

## 2022-07-30 MED ORDER — ONDANSETRON 8 MG PO TBDP: 8.0000 mg | ORAL_TABLET | Freq: Once | ORAL | Status: DC

## 2022-07-30 NOTE — ED Provider Notes (Signed)
[facility] EMERGENCY DEPARTMENT Provider Note   CSN: [id number] Arrival date & time: 07/30/22  8338     History  Chief Complaint  Patient presents with   Abdominal Pain    Daniel Duffy is a 71 y.o. male with h/o prostatitis presenting for abdominal pain. Pain started 1 week ago while asleep. Located in [place]. Pain is intermittent but worse with meals. Feels like tightness and "constipation".  Was seen on Monday at [facility]. He was unsure what his diagnosis was but has been taking medications since then. His medical provider advised him to be seen in the emergency department for possible CT scan regarding his ongoing abdominal pain.  Patient also states that he cannot sleep at night because the pain keeps him awake.  Denies fever, nausea, vomiting, diarrhea.  Denies shortness of breath and chest pain.  Does endorse back pain but states he has had back pain for a many years.   Abdominal Pain      Home Medications Prior to Admission medications   Medication Sig Start Date End Date Taking? Authorizing Provider  polyethylene glycol (MIRALAX) 17 g packet Take 17 g by mouth daily. 07/30/22  Yes Harriet Pho, PA-C  levocetirizine (XYZAL) 5 MG tablet Take 1 tablet (5 mg total) by mouth every evening. 02/12/22   Jaynee Eagles, PA-C  Multiple Vitamins-Minerals (MULTIVITAMINS THER. W/MINERALS) TABS Take 1 tablet by mouth daily.    [provider]  omeprazole (PRILOSEC) 20 MG capsule Take 1 capsule (20 mg total) by mouth daily. 03/23/13   Annitta Needs, NP  tamsulosin (FLOMAX) 0.4 MG CAPS capsule Take 0.4 mg by mouth.    [provider]      Allergies    Nitrates, organic    Review of Systems   Review of Systems  Gastrointestinal:  Positive for abdominal pain.    Physical Exam Updated Vital Signs BP 107/75 (BP Location: Left Arm)   Pulse 87   Temp 97.9 F (36.6 C) (Oral)   Resp 17   Ht '5\' 11"'$  (1.803 m)   Wt 67.1 kg   SpO2 100%   BMI 20.64 kg/m  Physical  Exam Vitals and nursing note reviewed.  HENT:     Head: Normocephalic and atraumatic.     Mouth/Throat:     Mouth: Mucous membranes are moist.  Eyes:     General:        Right eye: No discharge.        Left eye: No discharge.     Conjunctiva/sclera: Conjunctivae normal.  Cardiovascular:     Rate and Rhythm: Normal rate and regular rhythm.     Pulses: Normal pulses.     Heart sounds: Normal heart sounds.  Pulmonary:     Effort: Pulmonary effort is normal.     Breath sounds: Normal breath sounds.  Abdominal:     General: Abdomen is flat.     Palpations: Abdomen is soft.     Tenderness: There is generalized abdominal tenderness and tenderness in the epigastric area.  Skin:    General: Skin is warm and dry.  Neurological:     General: No focal deficit present.  Psychiatric:        Mood and Affect: Mood normal.     ED Results / Procedures / Treatments   Labs (all labs ordered are listed, but only abnormal results are displayed) Labs Reviewed  COMPREHENSIVE METABOLIC PANEL - Abnormal; Notable for the following components:      Result Value  Sodium 133 (*)    Glucose, Bld 142 (*)    Calcium 11.7 (*)    Albumin 3.2 (*)    Anion gap 4 (*)    All other components within normal limits  CBC - Abnormal; Notable for the following components:   Hemoglobin 12.4 (*)    HCT 38.3 (*)    All other components within normal limits  URINALYSIS, ROUTINE W REFLEX MICROSCOPIC - Abnormal; Notable for the following components:   APPearance HAZY (*)    Hgb urine dipstick SMALL (*)    Leukocytes,Ua MODERATE (*)    All other components within normal limits  LIPASE, BLOOD    EKG None  Radiology CT Abdomen Pelvis W Contrast  Result Date: 07/30/2022 CLINICAL DATA:  Lower abdominal pain and nausea for 1 week EXAM: CT ABDOMEN AND PELVIS WITH CONTRAST TECHNIQUE: Multidetector CT imaging of the abdomen and pelvis was performed using the standard protocol following bolus administration of  intravenous contrast. RADIATION DOSE REDUCTION: This exam was performed according to the departmental dose-optimization program which includes automated exposure control, adjustment of the mA and/or kV according to patient size and/or use of iterative reconstruction technique. CONTRAST:  122m OMNIPAQUE IOHEXOL 300 MG/ML  SOLN COMPARISON:  05/02/2017 FINDINGS: Lower chest: No acute abnormality. Hepatobiliary: Multiple hepatic cysts, largest at the posterior right hepatic dome measuring 2.0 cm. Additional subcentimeter lesions, too small to characterize. Unremarkable gallbladder. No hyperdense gallstone. No biliary dilatation. Pancreas: Unremarkable. No pancreatic ductal dilatation or surrounding inflammatory changes. Spleen: Normal in size without focal abnormality. Adrenals/Urinary Tract: Unremarkable adrenal glands. Kidneys enhance symmetrically without focal lesion, stone, or hydronephrosis. Ureters are nondilated. Urinary bladder appears unremarkable. Stomach/Bowel: Evaluation of the bowel is limited in the absence of enteric contrast and lack of intra-abdominal fat. Within this limitation, there is no bowel obstruction. There is a moderate to large amount of stool throughout the colon. No inflammatory changes are evident. Vascular/Lymphatic: Aortic atherosclerosis. No enlarged abdominal or pelvic lymph nodes. Reproductive: Prostatomegaly. Other: No free fluid. No abdominopelvic fluid collection. No pneumoperitoneum. No abdominal wall hernia. Musculoskeletal: Cachectic appearance with decreased subcutaneous fat and relative lack of intra-fat. Mild lower lumbar spondylosis. No suspicious lytic or sclerotic bone lesion. IMPRESSION: 1. No acute abdominopelvic findings. 2. Moderate to large amount of stool throughout the colon. Correlate for constipation. 3. Prostatomegaly. 4. Aortic Atherosclerosis (ICD10-I70.0). Electronically Signed   By: NDavina PokeD.O.   On: 07/30/2022 13:02    Procedures Procedures     Medications Ordered in ED Medications  ondansetron (ZOFRAN-ODT) disintegrating tablet 8 mg (0 mg Oral Hold 07/30/22 1134)  sodium chloride 0.9 % bolus 500 mL (0 mLs Intravenous Stopped 07/30/22 1319)  iohexol (OMNIPAQUE) 300 MG/ML solution 100 mL (100 mLs Intravenous Contrast Given 07/30/22 1229)    ED Course/ Medical Decision Making/ A&P                           Medical Decision Making Amount and/or Complexity of Data Reviewed Labs: ordered.    This patient presents to the ED for concern of abdominal pain, this involves a number of treatment options, and is a complaint that carries with it a high risk of complications and morbidity.  The differential diagnosis includes acute pancreatitis, appendicitis, gallbladder pathology, and ACS.   Co morbidities: Discussed in HPI     EMR reviewed including pt PMHx, past surgical history and past visits to ER.   See HPI for more details  Lab Tests:   I ordered and independently interpreted labs. Labs notable for pyuria, hyperglycemia   Imaging Studies:  NAD. I personally reviewed all imaging studies and no acute abnormality found. I agree with radiology interpretation.    Cardiac Monitoring:  The patient was maintained on a cardiac monitor.  I personally viewed and interpreted the cardiac monitored which showed an underlying rhythm of: NSR EKG non-ischemic   Medicines ordered:  I ordered medication including Zofran for nausea and fluid bolus for volume resuscitation. Reevaluation of the patient after these medicines showed that the patient improved I have reviewed the patients home medicines and have made adjustments as needed     Consults/Attending Physician   I discussed this case with my attending physician who cosigned this note including patient's presenting symptoms, physical exam, and planned diagnostics and interventions. Attending physician stated agreement with plan or made changes to plan which were  implemented.   Reevaluation:  After the interventions noted above I re-evaluated patient and found that they have :improved     Problem List / ED Course:  Presented for abdominal pain.  Exam revealed generalized abdominal pain with some focal tenderness in the epigastric region.  CT scan revealed large stool burden and prostatomegaly.  Patient has known history and is managed for acute prostatitis.  Zofran for nausea and fluid bolus for volume resuscitation.  Patient went to the bathroom and had a large bowel movement during this encounter.  Patient stated that he was feeling much better after his bowel movement.  Discharged home and advised to follow-up with PCP.  Ordered MiraLAX to be taken as needed for constipation but encouraged good hydration and fiber intake.  Discussed return precautions.   Dispostion:  After consideration of the diagnostic results and the patients response to treatment, I feel that the patent would benefit from discharge home and follow-up with PCP in the next few days regarding ongoing constipation.         Final Clinical Impression(s) / ED Diagnoses Final diagnoses:  Generalized abdominal pain  Constipation, unspecified constipation type    Rx / DC Orders ED Discharge Orders          Ordered    polyethylene glycol (MIRALAX) 17 g packet  Daily        07/30/22 1328    docusate sodium (COLACE) 250 MG capsule  Daily,   Status:  Discontinued        07/30/22 [place], Roland Prine K, PA-C 07/30/22 1343    Milton Ferguson, MD 07/30/22 [phone]

## 2022-07-30 NOTE — ED Notes (Signed)
Pt ambulated to restroom with little assistance

## 2022-07-30 NOTE — ED Triage Notes (Signed)
Pt presents to ED with complaints of lower abdominal pain and nausea x 1 week.

## 2022-07-30 NOTE — Discharge Instructions (Addendum)
Diagnosed with non for abdominal pain and constipation.  CT scan showed large stool burden.  Advised good hydration and fiber intake.  I have also prescribed MiraLAX to be taken as needed for constipation.  Otherwise advised to follow-up with PCP.  If abdominal pain is worse, new bloody urine or bloody stools, new back pain please return to the emergency department for further evaluation.

## 2022-08-26 ENCOUNTER — Encounter (HOSPITAL_COMMUNITY): Payer: Self-pay | Admitting: *Deleted

## 2022-08-26 ENCOUNTER — Other Ambulatory Visit: Payer: Self-pay

## 2022-08-26 ENCOUNTER — Emergency Department (HOSPITAL_COMMUNITY)
Admission: EM | Admit: 2022-08-26 | Discharge: 2022-08-26 | Disposition: A | Discharge status: Home / Self Care | Payer: PPO | Attending: Emergency Medicine | Admitting: Emergency Medicine

## 2022-08-26 ENCOUNTER — Emergency Department (HOSPITAL_COMMUNITY): Payer: PPO

## 2022-08-26 DIAGNOSIS — S4991XA Unspecified injury of right shoulder and upper arm, initial encounter: Secondary | ICD-10-CM | POA: Diagnosis present

## 2022-08-26 DIAGNOSIS — M25511 Pain in right shoulder: Secondary | ICD-10-CM | POA: Diagnosis not present

## 2022-08-26 DIAGNOSIS — S20211A Contusion of right front wall of thorax, initial encounter: Secondary | ICD-10-CM | POA: Insufficient documentation

## 2022-08-26 DIAGNOSIS — W19XXXA Unspecified fall, initial encounter: Secondary | ICD-10-CM

## 2022-08-26 DIAGNOSIS — R0781 Pleurodynia: Secondary | ICD-10-CM | POA: Diagnosis not present

## 2022-08-26 DIAGNOSIS — S40011A Contusion of right shoulder, initial encounter: Secondary | ICD-10-CM | POA: Insufficient documentation

## 2022-08-26 DIAGNOSIS — W010XXA Fall on same level from slipping, tripping and stumbling without subsequent striking against object, initial encounter: Secondary | ICD-10-CM | POA: Diagnosis not present

## 2022-08-26 MED ORDER — HYDROCODONE-ACETAMINOPHEN 5-325 MG PO TABS: ORAL_TABLET | ORAL | 0 refills | Status: DC

## 2022-08-26 MED ORDER — HYDROCODONE-ACETAMINOPHEN 5-325 MG PO TABS
1.0000 | ORAL_TABLET | Freq: Once | ORAL | Status: AC
  Administered 2022-08-26: 1 via ORAL
  Filled 2022-08-26: qty 1

## 2022-08-26 NOTE — ED Triage Notes (Signed)
Pt states he fell and landed on his right side after missing a step this am

## 2022-08-26 NOTE — ED Notes (Signed)
Patient transported to X-ray 

## 2022-08-26 NOTE — ED Provider Notes (Signed)
[facility] Provider Note   CSN: [id number] Arrival date & time: 08/26/22  1022     History  Chief Complaint  Patient presents with   Daniel Duffy    Daniel Duffy is a 71 y.o. male.   Fall Associated symptoms include chest pain (Right rib pain). Pertinent negatives include no abdominal pain, no headaches and no shortness of breath.        Daniel Duffy is a 70 y.o. male who presents to the Emergency Department complaining of right shoulder and right rib pain secondary to mechanical fall that occurred this morning.  He states that he tripped over the step and fell on the ground landing with his right arm underneath him.  He complains of lower right lateral rib pain that worsens with movement and deep breathing.  He also has some pain to the posterior right shoulder.  Denies head injury or LOC, shortness of breath, neck or back pain.  He does not take blood thinners.   Home Medications Prior to Admission medications   Medication Sig Start Date End Date Taking? Authorizing Provider  levocetirizine (XYZAL) 5 MG tablet Take 1 tablet (5 mg total) by mouth every evening. 02/12/22   Jaynee Eagles, PA-C  Multiple Vitamins-Minerals (MULTIVITAMINS THER. W/MINERALS) TABS Take 1 tablet by mouth daily.    [provider]  omeprazole (PRILOSEC) 20 MG capsule Take 1 capsule (20 mg total) by mouth daily. 03/23/13   Annitta Needs, NP  polyethylene glycol (MIRALAX) 17 g packet Take 17 g by mouth daily. 07/30/22   Harriet Pho, PA-C  tamsulosin (FLOMAX) 0.4 MG CAPS capsule Take 0.4 mg by mouth.    [provider]      Allergies    Nitrates, organic    Review of Systems   Review of Systems  Constitutional:  Negative for appetite change, chills and fever.  Eyes:  Negative for visual disturbance.  Respiratory:  Negative for chest tightness and shortness of breath.   Cardiovascular:  Positive for chest pain (Right rib pain). Negative for leg swelling.   Gastrointestinal:  Negative for abdominal pain, nausea and vomiting.  Musculoskeletal:  Positive for arthralgias (Right shoulder pain). Negative for back pain, joint swelling and neck pain.  Skin:  Negative for color change and wound.  Neurological:  Negative for dizziness, syncope, weakness, numbness and headaches.  Psychiatric/Behavioral:  Negative for confusion.     Physical Exam Updated Vital Signs BP 133/79   Pulse 67   Temp 98 F (36.7 C) (Oral)   Resp 16   Ht '5\' 11"'$  (1.803 m)   Wt 67.1 kg   SpO2 100%   BMI 20.64 kg/m  Physical Exam Vitals and nursing note reviewed.  Constitutional:      General: He is not in acute distress.    Appearance: Normal appearance. He is not ill-appearing or toxic-appearing.  HENT:     Head: Atraumatic.  Eyes:     Extraocular Movements: Extraocular movements intact.     Conjunctiva/sclera: Conjunctivae normal.     Pupils: Pupils are equal, round, and reactive to light.  Cardiovascular:     Rate and Rhythm: Normal rate and regular rhythm.     Pulses: Normal pulses.  Pulmonary:     Effort: Pulmonary effort is normal. No respiratory distress.  Chest:     Chest wall: Tenderness (Focal tenderness to palpation of the anterior lateral lower right ribs.  No ecchymosis, bony deformity or crepitus on exam.) present.  Abdominal:  General: There is no distension.     Palpations: Abdomen is soft.     Tenderness: There is no abdominal tenderness. There is no guarding.  Musculoskeletal:        General: Tenderness (Tenderness to palpation along the right shoulder joint and uppermost right scapula.  No bony deformities or step-offs.  Patient able to perform full range of motion of the right shoulder without difficulty.) and signs of injury present.     Cervical back: Normal range of motion. No tenderness.  Skin:    General: Skin is warm.     Capillary Refill: Capillary refill takes less than 2 seconds.  Neurological:     General: No focal deficit  present.     Mental Status: He is alert.     Sensory: No sensory deficit.     Motor: No weakness.     ED Results / Procedures / Treatments   Labs (all labs ordered are listed, but only abnormal results are displayed) Labs Reviewed - No data to display  EKG None  Radiology DG Ribs Unilateral W/Chest Right  Result Date: 08/26/2022 CLINICAL DATA:  Right rib pain after fall. EXAM: RIGHT RIBS AND CHEST - 3+ VIEW COMPARISON:  December 16, 2020. FINDINGS: No fracture or other bone lesions are seen involving the ribs. There is no evidence of pneumothorax or pleural effusion. Both lungs are clear. Heart size and mediastinal contours are within normal limits. IMPRESSION: Negative. Electronically Signed   By: Marijo Conception M.D.   On: 08/26/2022 11:17   DG Shoulder Right  Result Date: 08/26/2022 CLINICAL DATA:  Right shoulder pain after fall. EXAM: RIGHT SHOULDER - 2+ VIEW COMPARISON:  None Available. FINDINGS: There is no evidence of fracture or dislocation. There is no evidence of arthropathy or other focal bone abnormality. Soft tissues are unremarkable. IMPRESSION: Negative. Electronically Signed   By: Marijo Conception M.D.   On: 08/26/2022 11:15     Procedures Procedures    Medications Ordered in ED Medications - No data to display  ED Course/ Medical Decision Making/ A&P                           Medical Decision Making Patient here for evaluation of injury sustained in mechanical fall that occurred this morning.  Fell landing on his right side with his right arm underneath him.  Complaining of rib and shoulder pain.  Denies head injury or LOC.  No neck or back pain or shortness of breath.  He does not take blood thinners.  He was able to stand on his own without assistance after the fall.   On exam, patient has focal tenderness to the anterior lateral right ribs.  No crepitus or bony deformity.  No spinal tenderness on exam.  No focal neurodeficits.  Vital signs reassuring.   Differential would include but not limited to fracture, sprain, contusion, pneumothorax.  Clinically, I suspect this is related to contusion of the rib.  Possible occult fracture given mechanism of his fall.  Lung sounds are clear on exam, will obtain images of the ribs and chest for further evaluation.  I suspect he will be appropriate for discharge home.  Amount and/or Complexity of Data Reviewed Radiology: ordered.    Details: X-ray of the right shoulder without acute bony findings  Right ribs with chest negative for acute findings Discussion of management or test interpretation with external provider(s): Patient here with likely shoulder and rib  contusions secondary to mechanical fall  Neurovascularly intact.  Agreeable to symptomatic treatment.  He will follow-up closely outpatient with orthopedics in 1 week if his symptoms are not improving.  Return precautions were discussed.  Given incentive spirometry for home use  Risk Prescription drug management.           Final Clinical Impression(s) / ED Diagnoses Final diagnoses:  Fall, initial encounter  Contusion of rib on right side, initial encounter  Contusion of right shoulder, initial encounter    Rx / DC Orders ED Discharge Orders     None         Kem Parkinson, PA-C 08/28/22 1356    Fredia Sorrow, MD 08/28/22 2347

## 2022-08-26 NOTE — Discharge Instructions (Signed)
Your x-rays today were negative.  You likely have bruised your shoulder and ribs.  This should heal over time.  Use the incentive spirometer as directed to help prevent pneumonia.  Use a folded towel or pillow held to your side to cough and take deep breaths several times a day.  Take the pain medication as directed.  You may also need to take stool softeners with it to help prevent constipation.  Follow-up with your primary care provider for recheck or the orthopedic provider listed.

## 2022-09-04 ENCOUNTER — Telehealth: Payer: Self-pay

## 2022-09-04 NOTE — Telephone Encounter (Signed)
        Patient  visited [facility] on 9/20    Telephone encounter attempt :  1st  A HIPAA compliant voice message was left requesting a return call.  Instructed patient to call back    [facility] [place] [employer]  [phone] [address], [place], [place] [address] Phone: [phone] Email: [email or web address]

## 2022-09-15 ENCOUNTER — Other Ambulatory Visit: Payer: PPO

## 2022-09-15 DIAGNOSIS — R972 Elevated prostate specific antigen [PSA]: Secondary | ICD-10-CM

## 2022-09-16 LAB — PSA: Prostate Specific Ag, Serum: 5.2 ng/mL — ABNORMAL HIGH (ref 0.0–4.0)

## 2022-09-21 ENCOUNTER — Encounter: Payer: Self-pay | Admitting: Urology

## 2022-09-21 ENCOUNTER — Ambulatory Visit: Payer: PPO | Admitting: Urology

## 2022-09-21 VITALS — BP 113/76 | HR 79

## 2022-09-21 DIAGNOSIS — N401 Enlarged prostate with lower urinary tract symptoms: Secondary | ICD-10-CM | POA: Diagnosis not present

## 2022-09-21 DIAGNOSIS — R972 Elevated prostate specific antigen [PSA]: Secondary | ICD-10-CM

## 2022-09-21 LAB — URINALYSIS, ROUTINE W REFLEX MICROSCOPIC
Bilirubin, UA: NEGATIVE
Glucose, UA: NEGATIVE
Ketones, UA: NEGATIVE
Leukocytes,UA: NEGATIVE
Nitrite, UA: NEGATIVE
Protein,UA: NEGATIVE
Specific Gravity, UA: 1.015 (ref 1.005–1.030)
Urobilinogen, Ur: 0.2 mg/dL (ref 0.2–1.0)
pH, UA: 6 (ref 5.0–7.5)

## 2022-09-21 LAB — MICROSCOPIC EXAMINATION: Bacteria, UA: NONE SEEN

## 2022-09-21 NOTE — Progress Notes (Unsigned)
09/21/2022 9:23 AM   Daniel Duffy 1951-11-21 [id number]  Referring provider: Cory Munch, PA-C [address] [place],  [place] [address]  No chief complaint on file.   HPI:  F/u -    1) PSA elevation - a 10/18 PSA was 4.4 and recheck 3.03. His 06/20 PSA was 3.9 but 04/22 PSA 6.9, hematocrit 45, testosterone 1364 (he has a history of low testosterone. He doesn't recall or list T replacement). Prostate biopsy 06/22 benign with 53 gram prostate and a PSA of 5.7.    His 12/22 PSA was 6.0 (psad 0.11), so we went ahead and set up a Jan 2023 prostate MRI which was benign (PIRADS 2) with a 47 g prostate.    His PSA has increased Apr 2023 to 8.7. He thinks he had sex prior. His brother had PCa and he recalls what sounds like XRT.  Bx: Jun 2022 - benign, prostate 53 g   Staging: Jan 2023 pMRI - benign, prostate 53 g  Aug 2023 CT a/p - benign, prostate 80 g   2)  BPH on tamsulosin. Prostate 50-80 g on imaging. PVR was 11.  Neurogenic risk includes spinal cord surgery.  He has some urgency and nocturia.  AUASS = 4-6. He takes tamsulosin.    3) ED - His prior SHIM was 10 and he tried sildenafil. He saw Dr. Louis Meckel in [facility] 2018 - 2019. He loses an erection too soon.      Today, seen for the above. PSA recheck May 2023 was 5.5 with a normal DRE Apr 2023. His Oct 2023 PSA stable at 5.2. CT a/p Aug 2023 after  fall revealed an 80 g prostate and no LAD or bone lesions. AUASS = 6.      PMH: Past Medical History:  Diagnosis Date   Abnormal weight loss    Acid reflux    Acute prostatitis    Acute sinusitis, unspecified    Chronic idiopathic constipation    Colon polyps    Enlarged prostate    Hyperlipidemia, unspecified    Incisional hernia without obstruction or gangrene    Lumbago    Nonspecific elevation of levels of transaminase and lactic acid dehydrogenase (LDH)    Other male erectile dysfunction    Other obstructive and reflux uropathy    Radiculopathy, lumbar  region    Rash and other nonspecific skin eruption    Reflux esophagitis     Surgical History: Past Surgical History:  Procedure Laterality Date   COLONOSCOPY  12/29/2005   VEH:[id number] hemorrhoids, anal papilla/Diminutive polyp on a stalk at 10 cm/otherwise normal rectum and colon, PATH: hyperplastic polyp   COLONOSCOPY N/A 04/10/2013   RMR: tubular adenoma: next TCS 04/2018   COLONOSCOPY WITH PROPOFOL N/A 02/10/2018   Procedure: COLONOSCOPY WITH PROPOFOL;  Surgeon: Daneil Dolin, MD;  [facility];  Service: Endoscopy;  Laterality: N/A;   ESOPHAGOGASTRODUODENOSCOPY  12/29/2005   GEZ:[id number] Schatzki's ring, small hiatal hernia, normal gastric mucosa, normal D1 and D2.    HAND SURGERY     bilateral, got caught in a machine, two different occasions, right hand originally, than left hand. Left hand with skin graft   SPINAL CORD DECOMPRESSION      Home Medications:  Allergies as of 09/21/2022       Reactions   Nitrates, Organic         Medication List        Accurate as of September 21, 2022  9:23 AM. If you  have any questions, ask your nurse or doctor.          HYDROcodone-acetaminophen 5-325 MG tablet Commonly known as: NORCO/VICODIN Take one tab po q 4 hrs prn pain   levocetirizine 5 MG tablet Commonly known as: XYZAL Take 1 tablet (5 mg total) by mouth every evening.   multivitamins ther. w/minerals Tabs tablet Take 1 tablet by mouth daily.   omeprazole 20 MG capsule Commonly known as: PRILOSEC Take 1 capsule (20 mg total) by mouth daily.   polyethylene glycol 17 g packet Commonly known as: MiraLax Take 17 g by mouth daily.   tamsulosin 0.4 MG Caps capsule Commonly known as: FLOMAX Take 0.4 mg by mouth.        Allergies:  Allergies  Allergen Reactions   Nitrates, Organic     Family History: Family History  Problem Relation Age of Onset   CAD Brother     Social History:  reports that he has quit smoking. He has never used  smokeless tobacco. He reports that he does not drink alcohol and does not use drugs.   Physical Exam: There were no vitals taken for this visit.  Constitutional:  Alert and oriented, No acute distress. HEENT: [place] AT, moist mucus membranes.  Trachea midline, no masses. Cardiovascular: No clubbing, cyanosis, or edema. Respiratory: Normal respiratory effort, no increased work of breathing. GI: Abdomen is soft, nontender, nondistended, no abdominal masses GU: No CVA tenderness Lymph: No cervical or inguinal lymphadenopathy. Skin: No rashes, bruises or suspicious lesions. Neurologic: Grossly intact, no focal deficits, moving all 4 extremities. Psychiatric: Normal mood and affect.  Laboratory Data: Lab Results  Component Value Date   WBC 6.3 07/30/2022   HGB 12.4 (L) 07/30/2022   HCT 38.3 (L) 07/30/2022   MCV 86.8 07/30/2022   PLT 188 07/30/2022    Lab Results  Component Value Date   CREATININE 1.12 07/30/2022    Lab Results  Component Value Date   PSA 3.9 05/23/2019    No results found for: "TESTOSTERONE"  Lab Results  Component Value Date   HGBA1C 6.2 07/12/2015    Urinalysis    Component Value Date/Time   COLORURINE YELLOW 07/30/2022 1016   APPEARANCEUR HAZY (A) 07/30/2022 1016   APPEARANCEUR Clear 03/23/2022 0919   LABSPEC 1.011 07/30/2022 1016   PHURINE 6.0 07/30/2022 1016   GLUCOSEU NEGATIVE 07/30/2022 1016   HGBUR SMALL (A) 07/30/2022 1016   BILIRUBINUR NEGATIVE 07/30/2022 1016   BILIRUBINUR Negative 03/23/2022 0919   KETONESUR NEGATIVE 07/30/2022 1016   PROTEINUR NEGATIVE 07/30/2022 1016   UROBILINOGEN 0.2 01/29/2014 2300   NITRITE NEGATIVE 07/30/2022 1016   LEUKOCYTESUR MODERATE (A) 07/30/2022 1016    Lab Results  Component Value Date   LABMICR Comment 03/23/2022   WBCUA None seen 04/21/2021   LABEPIT None seen 04/21/2021   MUCUS Present 04/21/2021   BACTERIA NONE SEEN 07/30/2022    Pertinent Imaging: Aug 2023 CT a/p - images reviewed     Assessment & Plan:    1) PSA elevation - PSA remains stable with a normal PSAD and prior neg bx and MRI. Check PSA and DRE in 6 mo and consider yearly if stable.   2) BPH with LUTS - disc nature r/b/a to adding 5ari. He will continue tamsulosin.  No follow-ups on file.  Festus Aloe, MD  [facility]  [address] [place], [place] [address] [phone]

## 2023-01-14 ENCOUNTER — Other Ambulatory Visit (HOSPITAL_COMMUNITY): Payer: Self-pay | Admitting: Family Medicine

## 2023-01-21 ENCOUNTER — Encounter: Payer: Self-pay | Admitting: *Deleted

## 2023-02-02 ENCOUNTER — Encounter: Payer: Self-pay | Admitting: Internal Medicine

## 2023-02-19 NOTE — Progress Notes (Unsigned)
Primary Care Physician:  [facility] Primary Gastroenterologist:  Dr. Gala Romney  No chief complaint on file.   HPI:   Daniel Duffy is a 72 y.o. male with history of adenomatous colon polyps, constipation with associated abdominal pain, GERD, presenting today to discuss scheduling surveillance colonoscopy.  We have not seen patient since 2019.  Last colonoscopy 02/10/2018 pancolonic diverticulosis, otherwise normal exam.  Recommended repeat colonoscopy in 5 years for surveillance due to history of colon polyps.  Today:   Past Medical History:  Diagnosis Date   Abnormal weight loss    Acid reflux    Acute prostatitis    Acute sinusitis, unspecified    Chronic idiopathic constipation    Colon polyps    Enlarged prostate    Hyperlipidemia, unspecified    Incisional hernia without obstruction or gangrene    Lumbago    Nonspecific elevation of levels of transaminase and lactic acid dehydrogenase (LDH)    Other male erectile dysfunction    Other obstructive and reflux uropathy    Radiculopathy, lumbar region    Rash and other nonspecific skin eruption    Reflux esophagitis     Past Surgical History:  Procedure Laterality Date   COLONOSCOPY  12/29/2005   [id number] hemorrhoids, anal papilla/Diminutive polyp on a stalk at 10 cm/otherwise normal rectum and colon, PATH: hyperplastic polyp   COLONOSCOPY N/A 04/10/2013   RMR: tubular adenoma: next TCS 04/2018   COLONOSCOPY WITH PROPOFOL N/A 02/10/2018   Procedure: COLONOSCOPY WITH PROPOFOL;  Surgeon: Daneil Dolin, MD;  [facility];  Service: Endoscopy;  Laterality: N/A;   ESOPHAGOGASTRODUODENOSCOPY  12/29/2005   [id number] Schatzki's ring, small hiatal hernia, normal gastric mucosa, normal D1 and D2.    HAND SURGERY     bilateral, got caught in a machine, two different occasions, right hand originally, than left hand. Left hand with skin graft   SPINAL CORD DECOMPRESSION      Current  Outpatient Medications  Medication Sig Dispense Refill   HYDROcodone-acetaminophen (NORCO/VICODIN) 5-325 MG tablet Take one tab po q 4 hrs prn pain 8 tablet 0   levocetirizine (XYZAL) 5 MG tablet Take 1 tablet (5 mg total) by mouth every evening. 90 tablet 0   Multiple Vitamins-Minerals (MULTIVITAMINS THER. W/MINERALS) TABS Take 1 tablet by mouth daily.     omeprazole (PRILOSEC) 20 MG capsule Take 1 capsule (20 mg total) by mouth daily. 30 capsule 11   polyethylene glycol (MIRALAX) 17 g packet Take 17 g by mouth daily. 14 each 0   tamsulosin (FLOMAX) 0.4 MG CAPS capsule Take 0.4 mg by mouth.     No current facility-administered medications for this visit.    Allergies as of 02/22/2023 - Review Complete 09/21/2022  Allergen Reaction Noted   Nitrates, organic  02/12/2022    Family History  Problem Relation Age of Onset   CAD Brother     Social History   Socioeconomic History   Marital status: Divorced    Spouse name: Not on file   Number of children: Not on file   Years of education: Not on file   Highest education level: Not on file  Occupational History   Not on file  Tobacco Use   Smoking status: Former   Smokeless tobacco: Never  Vaping Use   Vaping Use: Never used  Substance and Sexual Activity   Alcohol use: No   Drug use: No   Sexual activity: Never  Other Topics Concern   Not  on file  Social History Narrative   Not on file   Social Determinants of Health   Financial Resource Strain: Not on file  Food Insecurity: Not on file  Transportation Needs: Not on file  Physical Activity: Not on file  Stress: Not on file  Social Connections: Not on file  Intimate Partner Violence: Not on file    Review of Systems: Gen: Denies any fever, chills, fatigue, weight loss, lack of appetite.  CV: Denies chest pain, heart palpitations, peripheral edema, syncope.  Resp: Denies shortness of breath at rest or with exertion. Denies wheezing or cough.  GI: Denies dysphagia  or odynophagia. Denies jaundice, hematemesis, fecal incontinence. GU : Denies urinary burning, urinary frequency, urinary hesitancy MS: Denies joint pain, muscle weakness, cramps, or limitation of movement.  Derm: Denies rash, itching, dry skin Psych: Denies depression, anxiety, memory loss, and confusion Heme: Denies bruising, bleeding, and enlarged lymph nodes.  Physical Exam: There were no vitals taken for this visit. General:   Alert and oriented. Pleasant and cooperative. Well-nourished and well-developed.  Head:  Normocephalic and atraumatic. Eyes:  Without icterus, sclera clear and conjunctiva pink.  Ears:  Normal auditory acuity. Lungs:  Clear to auscultation bilaterally. No wheezes, rales, or rhonchi. No distress.  Heart:  S1, S2 present without murmurs appreciated.  Abdomen:  +BS, soft, non-tender and non-distended. No HSM noted. No guarding or rebound. No masses appreciated.  Rectal:  Deferred  Msk:  Symmetrical without gross deformities. Normal posture. Extremities:  Without edema. Neurologic:  Alert and  oriented x4;  grossly normal neurologically. Skin:  Intact without significant lesions or rashes. Psych:  Alert and cooperative. Normal mood and affect.    Assessment:     Plan:  ***   Aliene Altes, PA-C [facility] 02/22/2023

## 2023-02-22 ENCOUNTER — Other Ambulatory Visit (HOSPITAL_COMMUNITY)
Admission: RE | Admit: 2023-02-22 | Discharge: 2023-02-22 | Disposition: A | Discharge status: Home / Self Care | Payer: 59 | Source: Ambulatory Visit | Attending: Gastroenterology | Admitting: Gastroenterology

## 2023-02-22 ENCOUNTER — Encounter: Payer: Self-pay | Admitting: Gastroenterology

## 2023-02-22 ENCOUNTER — Ambulatory Visit (INDEPENDENT_AMBULATORY_CARE_PROVIDER_SITE_OTHER): Payer: 59 | Admitting: Gastroenterology

## 2023-02-22 VITALS — BP 136/88 | HR 73 | Temp 97.8°F | Ht 71.0 in | Wt 149.4 lb

## 2023-02-22 DIAGNOSIS — R634 Abnormal weight loss: Secondary | ICD-10-CM | POA: Diagnosis not present

## 2023-02-22 DIAGNOSIS — Z86010 Personal history of colonic polyps: Secondary | ICD-10-CM | POA: Diagnosis not present

## 2023-02-22 DIAGNOSIS — K5900 Constipation, unspecified: Secondary | ICD-10-CM | POA: Diagnosis not present

## 2023-02-22 DIAGNOSIS — R110 Nausea: Secondary | ICD-10-CM

## 2023-02-22 DIAGNOSIS — D649 Anemia, unspecified: Secondary | ICD-10-CM

## 2023-02-22 DIAGNOSIS — K921 Melena: Secondary | ICD-10-CM

## 2023-02-22 LAB — CBC WITH DIFFERENTIAL/PLATELET
Abs Immature Granulocytes: 0.01 10*3/uL (ref 0.00–0.07)
Basophils Absolute: 0 10*3/uL (ref 0.0–0.1)
Basophils Relative: 1 %
Eosinophils Absolute: 0.1 10*3/uL (ref 0.0–0.5)
Eosinophils Relative: 4 %
HCT: 41.5 % (ref 39.0–52.0)
Hemoglobin: 13.3 g/dL (ref 13.0–17.0)
Immature Granulocytes: 0 %
Lymphocytes Relative: 38 %
Lymphs Abs: 1.3 10*3/uL (ref 0.7–4.0)
MCH: 28.1 pg (ref 26.0–34.0)
MCHC: 32 g/dL (ref 30.0–36.0)
MCV: 87.7 fL (ref 80.0–100.0)
Monocytes Absolute: 0.3 10*3/uL (ref 0.1–1.0)
Monocytes Relative: 7 %
Neutro Abs: 1.7 10*3/uL (ref 1.7–7.7)
Neutrophils Relative %: 50 %
Platelets: 174 10*3/uL (ref 150–400)
RBC: 4.73 MIL/uL (ref 4.22–5.81)
RDW: 13.9 % (ref 11.5–15.5)
WBC: 3.5 10*3/uL — ABNORMAL LOW (ref 4.0–10.5)
nRBC: 0 % (ref 0.0–0.2)

## 2023-02-22 LAB — COMPREHENSIVE METABOLIC PANEL
ALT: 25 U/L (ref 0–44)
AST: 29 U/L (ref 15–41)
Albumin: 3.7 g/dL (ref 3.5–5.0)
Alkaline Phosphatase: 149 U/L — ABNORMAL HIGH (ref 38–126)
Anion gap: 4 — ABNORMAL LOW (ref 5–15)
BUN: 20 mg/dL (ref 8–23)
CO2: 24 mmol/L (ref 22–32)
Calcium: 12 mg/dL — ABNORMAL HIGH (ref 8.9–10.3)
Chloride: 108 mmol/L (ref 98–111)
Creatinine, Ser: 1.1 mg/dL (ref 0.61–1.24)
GFR, Estimated: >60 mL/min (ref >60)
Glucose, Bld: 102 mg/dL — ABNORMAL HIGH (ref 70–99)
Potassium: 3.9 mmol/L (ref 3.5–5.1)
Sodium: 136 mmol/L (ref 135–145)
Total Bilirubin: 0.4 mg/dL (ref 0.3–1.2)
Total Protein: 7.2 g/dL (ref 6.5–8.1)

## 2023-02-22 LAB — IRON AND TIBC
Iron: 36 ug/dL — ABNORMAL LOW (ref 45–182)
Saturation Ratios: 12 % — ABNORMAL LOW (ref 17.9–39.5)
TIBC: 312 ug/dL (ref 250–450)
UIBC: 276 ug/dL

## 2023-02-22 LAB — FERRITIN: Ferritin: 207 ng/mL (ref 24–336)

## 2023-02-22 LAB — TSH: TSH: 0.665 u[IU]/mL (ref 0.350–4.500)

## 2023-02-22 NOTE — Patient Instructions (Addendum)
We will arrange for you to have a colonoscopy and upper endoscopy in the near future with Dr. Gala Romney at [facility]. We will give you samples of a medication called Linzess 145 mcg.  You will take this daily for 4 days before starting your colon prep to ensure that your bowel is well cleaned out due to your history of constipation.  Please have blood work completed at [facility].  We will arrange for you to have an ultrasound at [facility].  For constipation: Take MiraLAX 17 g (1 capful) in 8 ounces of water daily. If you continue to have constipation with taking MiraLAX daily, please let me know.  You may continue to use pepto bismol as needed for nausea.   Pepto bismol can cause your stools to look black. If you are noticing black stools even without taking pepto, please let me know.   We will see you back after your procedures.  Do not hesitate to call sooner if you have questions or concerns.  It was a pleasure to meet you today! I want to create trusting relationships with patients. If you receive a survey regarding your visit,  I greatly appreciate you taking time to fill this out on paper or through your MyChart. I value your feedback.  Aliene Altes, PA-C [facility]   Aliene Altes, [facility]

## 2023-02-23 ENCOUNTER — Other Ambulatory Visit: Payer: Self-pay | Admitting: *Deleted

## 2023-02-23 ENCOUNTER — Encounter: Payer: Self-pay | Admitting: *Deleted

## 2023-02-23 MED ORDER — PEG 3350-KCL-NA BICARB-NACL 420 G PO SOLR: 4000.0000 mL | Freq: Once | ORAL | 0 refills | Status: AC

## 2023-02-24 ENCOUNTER — Encounter: Payer: Self-pay | Admitting: Gastroenterology

## 2023-03-04 ENCOUNTER — Ambulatory Visit (HOSPITAL_COMMUNITY)
Admission: RE | Admit: 2023-03-04 | Discharge: 2023-03-04 | Disposition: A | Discharge status: Home / Self Care | Payer: 59 | Source: Ambulatory Visit | Attending: Family Medicine | Admitting: Family Medicine

## 2023-03-04 ENCOUNTER — Encounter (HOSPITAL_COMMUNITY): Payer: Self-pay

## 2023-03-05 ENCOUNTER — Ambulatory Visit (HOSPITAL_COMMUNITY)
Admission: RE | Admit: 2023-03-05 | Discharge: 2023-03-05 | Disposition: A | Discharge status: Home / Self Care | Payer: 59 | Source: Ambulatory Visit | Attending: Gastroenterology | Admitting: Gastroenterology

## 2023-03-05 DIAGNOSIS — R110 Nausea: Secondary | ICD-10-CM | POA: Diagnosis present

## 2023-03-09 ENCOUNTER — Telehealth: Payer: Self-pay | Admitting: *Deleted

## 2023-03-09 NOTE — Telephone Encounter (Signed)
[employer] PNotification or Prior Authorization is not required for the requested services You are not required to submit a notification/prior authorization based on the information provided. The number above acknowledges your inquiry and our response. Please write this number down and refer to it for future inquiries. If you still wish to submit your request for review, please select the Continue with Submission button below. Decision ID #: [id number] A:

## 2023-03-10 DIAGNOSIS — H6123 Impacted cerumen, bilateral: Secondary | ICD-10-CM | POA: Diagnosis not present

## 2023-03-10 DIAGNOSIS — H8110 Benign paroxysmal vertigo, unspecified ear: Secondary | ICD-10-CM | POA: Diagnosis not present

## 2023-03-10 DIAGNOSIS — Z6821 Body mass index (BMI) 21.0-21.9, adult: Secondary | ICD-10-CM | POA: Diagnosis not present

## 2023-03-10 DIAGNOSIS — E119 Type 2 diabetes mellitus without complications: Secondary | ICD-10-CM | POA: Diagnosis not present

## 2023-03-18 DIAGNOSIS — Z6821 Body mass index (BMI) 21.0-21.9, adult: Secondary | ICD-10-CM | POA: Diagnosis not present

## 2023-03-18 DIAGNOSIS — Z0001 Encounter for general adult medical examination with abnormal findings: Secondary | ICD-10-CM | POA: Diagnosis not present

## 2023-03-18 DIAGNOSIS — E785 Hyperlipidemia, unspecified: Secondary | ICD-10-CM | POA: Diagnosis not present

## 2023-03-18 DIAGNOSIS — R7309 Other abnormal glucose: Secondary | ICD-10-CM | POA: Diagnosis not present

## 2023-04-05 ENCOUNTER — Other Ambulatory Visit: Payer: Self-pay

## 2023-04-05 ENCOUNTER — Ambulatory Visit (HOSPITAL_BASED_OUTPATIENT_CLINIC_OR_DEPARTMENT_OTHER): Payer: 59 | Admitting: Anesthesiology

## 2023-04-05 ENCOUNTER — Encounter (HOSPITAL_COMMUNITY): Payer: Self-pay | Admitting: Internal Medicine

## 2023-04-05 ENCOUNTER — Ambulatory Visit (HOSPITAL_COMMUNITY)
Admission: RE | Admit: 2023-04-05 | Discharge: 2023-04-05 | Disposition: A | Discharge status: Home / Self Care | Payer: 59 | Attending: Internal Medicine | Admitting: Internal Medicine

## 2023-04-05 ENCOUNTER — Encounter (HOSPITAL_COMMUNITY)
Admission: RE | Disposition: A | Discharge status: Home / Self Care | Payer: Self-pay | Source: Home / Self Care | Attending: Internal Medicine

## 2023-04-05 ENCOUNTER — Ambulatory Visit (HOSPITAL_COMMUNITY): Payer: 59 | Admitting: Anesthesiology

## 2023-04-05 DIAGNOSIS — Z86010 Personal history of colonic polyps: Secondary | ICD-10-CM

## 2023-04-05 DIAGNOSIS — Z8719 Personal history of other diseases of the digestive system: Secondary | ICD-10-CM | POA: Diagnosis not present

## 2023-04-05 DIAGNOSIS — Z87891 Personal history of nicotine dependence: Secondary | ICD-10-CM | POA: Insufficient documentation

## 2023-04-05 DIAGNOSIS — R634 Abnormal weight loss: Secondary | ICD-10-CM | POA: Diagnosis not present

## 2023-04-05 DIAGNOSIS — K449 Diaphragmatic hernia without obstruction or gangrene: Secondary | ICD-10-CM | POA: Insufficient documentation

## 2023-04-05 DIAGNOSIS — Z6820 Body mass index (BMI) 20.0-20.9, adult: Secondary | ICD-10-CM | POA: Diagnosis not present

## 2023-04-05 DIAGNOSIS — K5730 Diverticulosis of large intestine without perforation or abscess without bleeding: Secondary | ICD-10-CM

## 2023-04-05 DIAGNOSIS — Z1211 Encounter for screening for malignant neoplasm of colon: Secondary | ICD-10-CM | POA: Diagnosis not present

## 2023-04-05 DIAGNOSIS — K921 Melena: Secondary | ICD-10-CM | POA: Insufficient documentation

## 2023-04-05 DIAGNOSIS — K219 Gastro-esophageal reflux disease without esophagitis: Secondary | ICD-10-CM | POA: Insufficient documentation

## 2023-04-05 DIAGNOSIS — R6889 Other general symptoms and signs: Secondary | ICD-10-CM | POA: Diagnosis not present

## 2023-04-05 DIAGNOSIS — Q438 Other specified congenital malformations of intestine: Secondary | ICD-10-CM | POA: Insufficient documentation

## 2023-04-05 DIAGNOSIS — G709 Myoneural disorder, unspecified: Secondary | ICD-10-CM | POA: Diagnosis not present

## 2023-04-05 DIAGNOSIS — R110 Nausea: Secondary | ICD-10-CM | POA: Diagnosis not present

## 2023-04-05 HISTORY — PX: ESOPHAGOGASTRODUODENOSCOPY (EGD) WITH PROPOFOL: SHX5813

## 2023-04-05 HISTORY — PX: BIOPSY: SHX5522

## 2023-04-05 HISTORY — PX: COLONOSCOPY WITH PROPOFOL: SHX5780

## 2023-04-05 SURGERY — COLONOSCOPY WITH PROPOFOL
Anesthesia: General

## 2023-04-05 MED ORDER — LACTATED RINGERS IV SOLN: INTRAVENOUS | Status: DC

## 2023-04-05 MED ORDER — PROPOFOL 500 MG/50ML IV EMUL
INTRAVENOUS | Status: DC | PRN
  Administered 2023-04-05: 150 ug/kg/min via INTRAVENOUS

## 2023-04-05 MED ORDER — STERILE WATER FOR IRRIGATION IR SOLN
Status: DC | PRN
  Administered 2023-04-05: 60 mL

## 2023-04-05 MED ORDER — PROPOFOL 10 MG/ML IV BOLUS
INTRAVENOUS | Status: DC | PRN
  Administered 2023-04-05 (×3): 50 mg via INTRAVENOUS

## 2023-04-05 NOTE — Op Note (Signed)
[facility] Patient Name: Daniel Duffy Procedure Date: 04/05/2023 10:47 AM MRN: [id number] Date of Birth: 07/19/1951 Attending MD: Gennette Pac , MD, [id number] CSN: [id number] Age: 72 Admit Type: Outpatient Procedure:                Colonoscopy Indications:              High risk colon cancer surveillance: Personal                            history of colonic polyps Providers:                Gennette Pac, MD, Crystal Page, Durwin Glaze Tech, Technician Referring MD:              Medicines:                Propofol per Anesthesia Complications:            No immediate complications. Estimated Blood Loss:     Estimated blood loss: none. Procedure:                Pre-Anesthesia Assessment:                           - Prior to the procedure, a History and Physical                            was performed, and patient medications and                            allergies were reviewed. The patient's tolerance of                            previous anesthesia was also reviewed. The risks                            and benefits of the procedure and the sedation                            options and risks were discussed with the patient.                            All questions were answered, and informed consent                            was obtained. Prior Anticoagulants: The patient has                            taken no anticoagulant or antiplatelet agents. ASA                            Grade Assessment: II - A patient with mild systemic  disease. After reviewing the risks and benefits,                            the patient was deemed in satisfactory condition to                            undergo the procedure.                           After obtaining informed consent, the colonoscope                            was passed under direct vision. Throughout the                            procedure, the  patient's blood pressure, pulse, and                            oxygen saturations were monitored continuously. The                            [id number] scope was introduced through the                            anus and advanced to the the cecum, identified by                            appendiceal orifice and ileocecal valve. The                            colonoscopy was performed without difficulty. The                            patient tolerated the procedure well. The quality                            of the bowel preparation was adequate. The                            ileocecal valve, appendiceal orifice, and rectum                            were photographed. Scope In: 11:16:49 AM Scope Out: 11:34:57 AM Scope Withdrawal Time: 0 hours 6 minutes 58 seconds  Total Procedure Duration: 0 hours 18 minutes 8 seconds  Findings:      The perianal and digital rectal examinations were normal.      A few small-mouthed diverticula were found in the entire colon.       Redundant colon requiring external abdominal pressure to reach the cecum.      The exam was otherwise without abnormality. Impression:               - Diverticulosis in the entire examined colon.  Redundant colon.                           - The examination was otherwise normal.                           - No specimens collected. Moderate Sedation:      Moderate (conscious) sedation was personally administered by an       anesthesia professional. The following parameters were monitored: oxygen       saturation, heart rate, blood pressure, and response to care. Recommendation:           - Patient has a contact number available for                            emergencies. The signs and symptoms of potential                            delayed complications were discussed with the                            patient. Return to normal activities tomorrow.                            Written  discharge instructions were provided to the                            patient.                           - Advance diet as tolerated.                           - Continue present medications.                           - Repeat colonoscopy in 7 years for surveillance.                            See EGD report.                           - Return to GI office in 6 weeks. Procedure Code(s):        --- Professional ---                           [phone], Colonoscopy, flexible; diagnostic, including                            collection of specimen(s) by brushing or washing,                            when performed (separate procedure) Diagnosis Code(s):        --- Professional ---                           Z86.010, Personal history of colonic  polyps                           K57.30, Diverticulosis of large intestine without                            perforation or abscess without bleeding CPT copyright 2022 American Medical Association. All rights reserved. The codes documented in this report are preliminary and upon coder review may  be revised to meet current compliance requirements. [name]. Judee Hennick, MD Gennette Pac, MD 04/05/2023 11:41:15 AM This report has been signed electronically. Number of Addenda: 0

## 2023-04-05 NOTE — Anesthesia Preprocedure Evaluation (Signed)
Anesthesia Evaluation  Patient identified by MRN, date of birth, ID band Patient awake    Reviewed: Allergy & Precautions, H&P , NPO status , Patient's Chart, lab work & pertinent test results, reviewed documented beta blocker date and time   Airway Mallampati: II  TM Distance: >3 FB Neck ROM: full    Dental no notable dental hx.    Pulmonary neg pulmonary ROS, former smoker   Pulmonary exam normal breath sounds clear to auscultation       Cardiovascular Exercise Tolerance: Good negative cardio ROS  Rhythm:regular Rate:Normal     Neuro/Psych  Neuromuscular disease negative neurological ROS  negative psych ROS   GI/Hepatic negative GI ROS, Neg liver ROS,GERD  ,,  Endo/Other  negative endocrine ROS    Renal/GU negative Renal ROS  negative genitourinary   Musculoskeletal   Abdominal   Peds  Hematology negative hematology ROS (+)   Anesthesia Other Findings   Reproductive/Obstetrics negative OB ROS                             Anesthesia Physical Anesthesia Plan  ASA: 2  Anesthesia Plan: General   Post-op Pain Management:    Induction:   PONV Risk Score and Plan: Propofol infusion  Airway Management Planned:   Additional Equipment:   Intra-op Plan:   Post-operative Plan:   Informed Consent: I have reviewed the patients History and Physical, chart, labs and discussed the procedure including the risks, benefits and alternatives for the proposed anesthesia with the patient or authorized representative who has indicated his/her understanding and acceptance.     Dental Advisory Given  Plan Discussed with: CRNA  Anesthesia Plan Comments:        Anesthesia Quick Evaluation

## 2023-04-05 NOTE — Op Note (Signed)
[facility] Patient Name: Daniel Duffy Procedure Date: 04/05/2023 10:47 AM MRN: [id number] Date of Birth: 1951/03/29 Attending MD: Gennette Pac , MD, [id number] CSN: [id number] Age: 72 Admit Type: Outpatient Procedure:                Upper GI endoscopy Indications:              Melena, nausea, weight loss Providers:                Gennette Pac, MD, Crystal Page, Durwin Glaze Tech, Technician Referring MD:              Medicines:                Propofol per Anesthesia Complications:            No immediate complications. Estimated Blood Loss:     Estimated blood loss was minimal. Procedure:                Pre-Anesthesia Assessment:                           - Prior to the procedure, a History and Physical                            was performed, and patient medications and                            allergies were reviewed. The patient's tolerance of                            previous anesthesia was also reviewed. The risks                            and benefits of the procedure and the sedation                            options and risks were discussed with the patient.                            All questions were answered, and informed consent                            was obtained. Prior Anticoagulants: The patient has                            taken no anticoagulant or antiplatelet agents. ASA                            Grade Assessment: II - A patient with mild systemic                            disease. After reviewing the risks and benefits,  the patient was deemed in satisfactory condition to                            undergo the procedure.                           After obtaining informed consent, the endoscope was                            passed under direct vision. Throughout the                            procedure, the patient's blood pressure, pulse, and                             oxygen saturations were monitored continuously. The                            [id number]-[id number] ([id number]) scope was introduced through the                            mouth, and advanced to the second part of duodenum.                            The upper GI endoscopy was accomplished without                            difficulty. The patient tolerated the procedure                            well. The patient tolerated the procedure well. Scope In: 11:06:38 AM Scope Out: 11:11:14 AM Total Procedure Duration: 0 hours 4 minutes 36 seconds  Findings:      The examined esophagus was normal.      A small hiatal hernia was present. Mildly polypoid mucosa diffusely. No       ulcer or infiltrating process. Patent pylorus.      The duodenal bulb and second portion of the duodenum were normal.       Finally, biopsies the abnormal appearing gastric mucosa taken for       histologic study. Impression:               - Normal esophagus.                           - Small hiatal hernia. Minimally polypoid mucosa of                            doubtful clinical significance?status post biopsy                           - Normal duodenal bulb and second portion of the                            duodenum. Moderate Sedation:      Moderate (conscious) sedation was personally administered by  an       anesthesia professional. The following parameters were monitored: oxygen       saturation, heart rate, blood pressure, respiratory rate, EKG, adequacy       of pulmonary ventilation, and response to care. Recommendation:           - Await pathology results.                           - Advance diet as tolerated. Return to the office                            in 6 weeks. See colonoscopy report. Procedure Code(s):        --- Professional ---                           [phone], Esophagogastroduodenoscopy, flexible,                            transoral; with biopsy, single or multiple Diagnosis Code(s):        --- Professional  ---                           K44.9, Diaphragmatic hernia without obstruction or                            gangrene                           K92.1, Melena (includes Hematochezia) CPT copyright 2022 American Medical Association. All rights reserved. The codes documented in this report are preliminary and upon coder review may  be revised to meet current compliance requirements. Daniel Duffy. Daniel Bergeson, MD Gennette Pac, MD 04/05/2023 11:38:05 AM This report has been signed electronically. Number of Addenda: 0

## 2023-04-05 NOTE — Discharge Instructions (Addendum)
  Colonoscopy Discharge Instructions  Read the instructions outlined below and refer to this sheet in the next few weeks. These discharge instructions provide you with general information on caring for yourself after you leave the hospital. Your doctor may also give you specific instructions. While your treatment has been planned according to the most current medical practices available, unavoidable complications occasionally occur. If you have any problems or questions after discharge, call Dr. Jena Gauss at [phone]. ACTIVITY You may resume your regular activity, but move at a slower pace for the next 24 hours.  Take frequent rest periods for the next 24 hours.  Walking will help get rid of the air and reduce the bloated feeling in your belly (abdomen).  No driving for 24 hours (because of the medicine (anesthesia) used during the test).   Do not sign any important legal documents or operate any machinery for 24 hours (because of the anesthesia used during the test).  NUTRITION Drink plenty of fluids.  You may resume your normal diet as instructed by your doctor.  Begin with a light meal and progress to your normal diet. Heavy or fried foods are harder to digest and may make you feel sick to your stomach (nauseated).  Avoid alcoholic beverages for 24 hours or as instructed.  MEDICATIONS You may resume your normal medications unless your doctor tells you otherwise.  WHAT YOU CAN EXPECT TODAY Some feelings of bloating in the abdomen.  Passage of more gas than usual.  Spotting of blood in your stool or on the toilet paper.  IF YOU HAD POLYPS REMOVED DURING THE COLONOSCOPY: No aspirin products for 7 days or as instructed.  No alcohol for 7 days or as instructed.  Eat a soft diet for the next 24 hours.  FINDING OUT THE RESULTS OF YOUR TEST Not all test results are available during your visit. If your test results are not back during the visit, make an appointment with your caregiver to find out the  results. Do not assume everything is normal if you have not heard from your caregiver or the medical facility. It is important for you to follow up on all of your test results.  SEEK IMMEDIATE MEDICAL ATTENTION IF: You have more than a spotting of blood in your stool.  Your belly is swollen (abdominal distention).  You are nauseated or vomiting.  You have a temperature over 101.  You have abdominal pain or discomfort that is severe or gets worse throughout the day.   The risks, benefits, limitations, alternatives and imponderables have been reviewed with the patient. Potential for esophageal dilation, biopsy, etc. have also been reviewed.  Questions have been answered. All parties agreeable.     stomach appeared mildly abnormal.  Biopsies taken  Diverticulosis in the colon.  No polyps.  Recommend 1 more colonoscopy in 7 years  Office visit with [place] in 6 weeks    OFFICE WILL CONTACT YOU OR CHECK MYCHART IF YOU HAVE ACCESS   At patient request, I called Vernie Shanks at [phone] answer

## 2023-04-05 NOTE — Transfer of Care (Signed)
Immediate Anesthesia Transfer of Care Note  Patient: Daniel Duffy  Procedure(s) Performed: COLONOSCOPY WITH PROPOFOL ESOPHAGOGASTRODUODENOSCOPY (EGD) WITH PROPOFOL BIOPSY  Patient Location: Endoscopy Unit  Anesthesia Type:General  Level of Consciousness: awake  Airway & Oxygen Therapy: Patient Spontanous Breathing  Post-op Assessment: Report given to RN  Post vital signs: Reviewed and stable  Last Vitals:  Vitals Value Taken Time  BP    Temp    Pulse    Resp    SpO2      Last Pain:  Vitals:   04/05/23 1100  TempSrc:   PainSc: 0-No pain      Patients Stated Pain Goal: 8 (04/05/23 0946)  Complications: No notable events documented.

## 2023-04-05 NOTE — Anesthesia Postprocedure Evaluation (Signed)
Anesthesia Post Note  Patient: Daniel Duffy  Procedure(s) Performed: COLONOSCOPY WITH PROPOFOL ESOPHAGOGASTRODUODENOSCOPY (EGD) WITH PROPOFOL BIOPSY  Patient location during evaluation: Endoscopy Anesthesia Type: General Level of consciousness: awake and alert Pain management: pain level controlled Vital Signs Assessment: post-procedure vital signs reviewed and stable Respiratory status: spontaneous breathing Cardiovascular status: blood pressure returned to baseline and stable Postop Assessment: no apparent nausea or vomiting Anesthetic complications: no   No notable events documented.   Last Vitals:  Vitals:   04/05/23 0946  BP: (!) 140/77  Pulse: 73  Resp: 16  Temp: 36.9 C  SpO2: 98%    Last Pain:  Vitals:   04/05/23 1100  TempSrc:   PainSc: 0-No pain                 Deserie Dirks

## 2023-04-05 NOTE — H&P (Signed)
@LOGO @   Primary Care Physician:  [facility] Primary Gastroenterologist:  Dr. Jena Gauss  Pre-Procedure History & Physical: HPI:  Daniel Duffy is a 72 y.o. male here for  here for further evaluation of history of polyps we have surveillance colonoscopy.  Also nausea reported melena and weight loss no dysphagia here for an EGD as well.  Denies dysphagia.  Past Medical History:  Diagnosis Date   Abnormal weight loss    Acid reflux    Acute prostatitis    Acute sinusitis, unspecified    Chronic idiopathic constipation    Colon polyps    Enlarged prostate    Hyperlipidemia, unspecified    Incisional hernia without obstruction or gangrene    Lumbago    Nonspecific elevation of levels of transaminase and lactic acid dehydrogenase (LDH)    Other male erectile dysfunction    Other obstructive and reflux uropathy    Radiculopathy, lumbar region    Rash and other nonspecific skin eruption    Reflux esophagitis     Past Surgical History:  Procedure Laterality Date   COLONOSCOPY  12/29/2005   [id number] hemorrhoids, anal papilla/Diminutive polyp on a stalk at 10 cm/otherwise normal rectum and colon, PATH: hyperplastic polyp   COLONOSCOPY N/A 04/10/2013   RMR: tubular adenoma: next TCS 04/2018   COLONOSCOPY WITH PROPOFOL N/A 02/10/2018   Surgeon: Corbin Ade, MD;  pancolonic diverticulosis, otherwise normal exam.  Recommended repeat colonoscopy in 5 years for surveillance due to history of colon polyps.   ESOPHAGOGASTRODUODENOSCOPY  12/29/2005   [id number] Schatzki's ring, small hiatal hernia, normal gastric mucosa, normal D1 and D2.    HAND SURGERY     bilateral, got caught in a machine, two different occasions, right hand originally, than left hand. Left hand with skin graft   SPINAL CORD DECOMPRESSION      Prior to Admission medications   Medication Sig Start Date End Date Taking? Authorizing Provider  ammonium lactate (AMLACTIN) 12 % cream Apply 1  Application topically 2 (two) times daily. 02/09/23  Yes [provider]  meclizine (ANTIVERT) 25 MG tablet Take 25 mg by mouth every 6 (six) hours as needed for dizziness. 03/28/23  Yes [provider]  Multiple Vitamins-Minerals (MULTIVITAMINS THER. W/MINERALS) TABS Take 1 tablet by mouth daily.   Yes [provider]  OMEPRAZOLE PO Take 1 capsule by mouth daily.   Yes [provider]  polyethylene glycol (MIRALAX) 17 g packet Take 17 g by mouth daily. 07/30/22  Yes Gareth Eagle, PA-C  tamsulosin (FLOMAX) 0.4 MG CAPS capsule Take 0.4 mg by mouth in the morning.   Yes [provider]    Allergies as of 02/23/2023 - Review Complete 02/22/2023  Allergen Reaction Noted   Nitrates, organic  02/12/2022    Family History  Problem Relation Age of Onset   CAD Brother    Colon polyps Brother     Social History   Socioeconomic History   Marital status: Divorced    Spouse name: Not on file   Number of children: Not on file   Years of education: Not on file   Highest education level: Not on file  Occupational History   Not on file  Tobacco Use   Smoking status: Former   Smokeless tobacco: Never  Vaping Use   Vaping Use: Never used  Substance and Sexual Activity   Alcohol use: No   Drug use: No   Sexual activity: Never  Other Topics Concern  Not on file  Social History Narrative   Not on file   Social Determinants of Health   Financial Resource Strain: Not on file  Food Insecurity: Not on file  Transportation Needs: Not on file  Physical Activity: Not on file  Stress: Not on file  Social Connections: Not on file  Intimate Partner Violence: Not on file    Review of Systems: See HPI, otherwise negative ROS  Physical Exam: BP (!) 140/77   Pulse 73   Temp 98.5 F (36.9 C) (Oral)   Resp 16   Ht 5\' 11"  (1.803 m)   Wt 68 kg   SpO2 98%   BMI 20.92 kg/m  General:   Alert,  Well-developed, well-nourished, pleasant and  cooperative in NAD Neck:  Supple; no masses or thyromegaly. No significant cervical adenopathy. Lungs:  Clear throughout to auscultation.   No wheezes, crackles, or rhonchi. No acute distress. Heart:  Regular rate and rhythm; no murmurs, clicks, rubs,  or gallops. Abdomen: Non-distended, normal bowel sounds.  Soft and nontender without appreciable mass or hepatosplenomegaly.  Pulses:  Normal pulses noted. Extremities:  Without clubbing or edema.  Impression/Plan:    72 year old gentleman here for surveillance colonoscopy.  History colonic adenoma.  Also, recent weight loss reported melena and nausea.  No dysphagia.    I have offered the patient both an EGD and colonoscopy today. The risks, benefits, limitations, imponderables and alternatives regarding both EGD and colonoscopy have been reviewed with the patient. Questions have been answered. All parties agreeable.       Notice: This dictation was prepared with Dragon dictation along with smaller phrase technology. Any transcriptional errors that result from this process are unintentional and may not be corrected upon review.

## 2023-04-06 ENCOUNTER — Encounter: Payer: Self-pay | Admitting: Internal Medicine

## 2023-04-06 LAB — SURGICAL PATHOLOGY

## 2023-04-08 ENCOUNTER — Encounter (HOSPITAL_COMMUNITY): Payer: Self-pay | Admitting: Internal Medicine

## 2023-04-09 DIAGNOSIS — Z6821 Body mass index (BMI) 21.0-21.9, adult: Secondary | ICD-10-CM | POA: Diagnosis not present

## 2023-04-09 DIAGNOSIS — M5030 Other cervical disc degeneration, unspecified cervical region: Secondary | ICD-10-CM | POA: Diagnosis not present

## 2023-04-27 DIAGNOSIS — M20091 Other deformity of right finger(s): Secondary | ICD-10-CM | POA: Diagnosis not present

## 2023-04-27 DIAGNOSIS — Z79899 Other long term (current) drug therapy: Secondary | ICD-10-CM | POA: Diagnosis not present

## 2023-04-27 DIAGNOSIS — K449 Diaphragmatic hernia without obstruction or gangrene: Secondary | ICD-10-CM | POA: Diagnosis not present

## 2023-04-27 DIAGNOSIS — M20092 Other deformity of left finger(s): Secondary | ICD-10-CM | POA: Diagnosis not present

## 2023-04-27 DIAGNOSIS — Z888 Allergy status to other drugs, medicaments and biological substances status: Secondary | ICD-10-CM | POA: Diagnosis not present

## 2023-04-27 DIAGNOSIS — N400 Enlarged prostate without lower urinary tract symptoms: Secondary | ICD-10-CM | POA: Insufficient documentation

## 2023-04-27 DIAGNOSIS — Z0389 Encounter for observation for other suspected diseases and conditions ruled out: Secondary | ICD-10-CM | POA: Diagnosis not present

## 2023-04-27 DIAGNOSIS — D61818 Other pancytopenia: Secondary | ICD-10-CM | POA: Diagnosis not present

## 2023-04-27 DIAGNOSIS — R079 Chest pain, unspecified: Secondary | ICD-10-CM | POA: Diagnosis not present

## 2023-04-27 DIAGNOSIS — E860 Dehydration: Secondary | ICD-10-CM | POA: Diagnosis not present

## 2023-04-27 DIAGNOSIS — K219 Gastro-esophageal reflux disease without esophagitis: Secondary | ICD-10-CM | POA: Diagnosis not present

## 2023-04-27 DIAGNOSIS — I361 Nonrheumatic tricuspid (valve) insufficiency: Secondary | ICD-10-CM | POA: Diagnosis not present

## 2023-04-27 DIAGNOSIS — R0789 Other chest pain: Secondary | ICD-10-CM | POA: Diagnosis not present

## 2023-04-27 DIAGNOSIS — R7989 Other specified abnormal findings of blood chemistry: Secondary | ICD-10-CM | POA: Diagnosis not present

## 2023-04-27 DIAGNOSIS — I5189 Other ill-defined heart diseases: Secondary | ICD-10-CM | POA: Diagnosis not present

## 2023-04-27 DIAGNOSIS — I340 Nonrheumatic mitral (valve) insufficiency: Secondary | ICD-10-CM | POA: Diagnosis not present

## 2023-05-04 DIAGNOSIS — Z6821 Body mass index (BMI) 21.0-21.9, adult: Secondary | ICD-10-CM | POA: Diagnosis not present

## 2023-05-04 DIAGNOSIS — R0789 Other chest pain: Secondary | ICD-10-CM | POA: Diagnosis not present

## 2023-05-05 ENCOUNTER — Encounter: Payer: Self-pay | Admitting: Student

## 2023-05-05 ENCOUNTER — Ambulatory Visit: Payer: 59 | Attending: Student | Admitting: Student

## 2023-05-05 VITALS — BP 120/84 | HR 84 | Ht 71.0 in | Wt 158.0 lb

## 2023-05-05 DIAGNOSIS — R0789 Other chest pain: Secondary | ICD-10-CM | POA: Diagnosis not present

## 2023-05-05 DIAGNOSIS — E785 Hyperlipidemia, unspecified: Secondary | ICD-10-CM | POA: Diagnosis not present

## 2023-05-05 NOTE — Progress Notes (Signed)
Cardiology Office Note    Date:  05/05/2023  ID:  Daniel Duffy, DOB 18-Apr-1951, MRN [id number] Cardiologist: Dina Rich, MD    History of Present Illness:    Daniel Duffy is a 72 y.o. male with past medical history of possible pericarditis (diagnosed in 12/2020), BPH and GERD who presents to the office today for follow-up from his recent hospitalization.  He was last examined by Dr. Wyline Mood in 07/2021 and had previously been in the ER for chest pain and was overall felt that his recent symptoms were secondary to pericarditis given his history and EKG changes. Given that symptoms had resolved, Colchicine was discontinued and he was encouraged to follow-up as needed.  By review of Care Everywhere, he was admitted to [facility] on 04/27/2023 for evaluation of intermittent chest pain and troponin values were negative. Echocardiogram showed a preserved EF of 50 to 55% with normal RV function and no significant valve abnormalities. He underwent a [employer] which showed a fixed defect which may be due to soft tissue attenuation versus scar and no evidence of ischemia. It was overall a low-risk study. He was discharged home and advised to increase his PPI to twice daily dosing for 30 days.  In talking with the patient today, he reports his episode of chest discomfort which led to ED evaluation occurred at nighttime. He feels like symptoms have significantly improved since PPI therapy was adjusted. By review of the chart, he did undergo Endoscopy in 03/2023 which showed a normal esophagus but did have a small hiatal hernia. He reports a repeat episode since returning home but symptoms occurred after he had consumed supper and went to bed to go to sleep. Says that he typically eats dinner and then goes to bed within 1 hour. He remains active in doing yard work and chores around his home and denies any recent chest pain or dyspnea on exertion with these activities. No recent orthopnea, PND,  pitting edema or palpitations.  Studies Reviewed:   EKG: EKG is not ordered today.  Echocardiogram: 04/2023 Left Ventricle: Size is normal. Normal wall thickness. Normal wall  motion. Normal LV systolic function with an approximate EF of 50 - 55%.  Grade I: There is impaired left ventricular relaxation.    Right Ventricle: Size is normal. Normal systolic function.    Aortic Valve: Normal valve structure. Trileaflet.    Tricuspid Valve: There is no pulmonary hypertension. RVSP is 20 mmHg.  Mild regurgitation.    A complete 2D, color flow doppler and spectral doppler echocardiogram  was performed.    NST: 04/2023 This result has an attachment that is not available.    Perfusion Imaging: There is no evidence of transient ischemic dilation  (TID), the value is 0.72. Stress images demonstrate a defect that is  medium to large in size present in the entire inferior and inferolateral  location(s) that is fixed on the resting images. Fixed defect present,  which may be due to tissue attenuation artifact versus scar and there is  no evidence of ischemia.    Left Ventricular Function: Normal wall motion and normal systolic  function.   A pharmacological stress test was performed using 0.4mg /34ml of  Regadenson (Lexiscan) given intravenously over 10 seconds. The patient  reported no symptoms during the stress test.    Stress ECG Findings: There were no arrhythmias.    Recovery ECG Findings: There were no arrhythmias.    Physical Exam:   VS:  BP 120/84   Pulse  84   Ht 5\' 11"  (1.803 m)   Wt 158 lb (71.7 kg)   SpO2 97%   BMI 22.04 kg/m    Wt Readings from Last 3 Encounters:  05/05/23 158 lb (71.7 kg)  04/05/23 150 lb (68 kg)  02/22/23 149 lb 6.4 oz (67.8 kg)     GEN: Well nourished, well developed male appearing in no acute distress NECK: No JVD; No carotid bruits CARDIAC: RRR, no murmurs, rubs, gallops RESPIRATORY:  Clear to auscultation without rales, wheezing or rhonchi   ABDOMEN: Appears non-distended. No obvious abdominal masses. EXTREMITIES: No clubbing or cyanosis. No pitting edema.  Distal pedal pulses are 2+ bilaterally.   Assessment and Plan:   1. History of Possible Pericarditis/Atypical Chest Pain - He has a history of possible pericarditis but his recent symptoms seem atypical for this and echocardiogram at that time showed no evidence of an effusion. We reviewed his recent echocardiogram and stress test as discussed above as his Lexiscan did show likely soft tissue attenuation but scarring could not be ruled out but EF was at 55%. His current symptoms seem most consistent with acid reflux and reviewed the importance of trying to wait several hours after consuming supper before going to bed and elevating the head of his bed. I encouraged him to make [place] aware if symptoms do not improve or he develops any exertional symptoms as a Coronary CT could be pursued for further evaluation. He will continue on PPI therapy and has scheduled follow-up with GI in 2 weeks.  2. HLD - Recent FLP earlier this month showed total cholesterol 244, triglycerides 77, HDL 84 and LDL 145.  He was started on Atorvastatin 40 mg daily at that time and will plan for repeat FLP and LFT's in 2 months.   Signed, Ellsworth Lennox, PA-C

## 2023-05-05 NOTE — Patient Instructions (Addendum)
Medication Instructions:  Your physician recommends that you continue on your current medications as directed. Please refer to the Current Medication list given to you today.   Labwork: Fasting Lipids and CMET in 2-3 months at [facility]  Testing/Procedures: None today  Follow-Up: 6 months  Any Other Special Instructions Will Be Listed Below (If Applicable).  If you need a refill on your cardiac medications before your next appointment, please call your pharmacy.

## 2023-05-11 DIAGNOSIS — M79671 Pain in right foot: Secondary | ICD-10-CM | POA: Diagnosis not present

## 2023-05-11 DIAGNOSIS — M79674 Pain in right toe(s): Secondary | ICD-10-CM | POA: Diagnosis not present

## 2023-05-11 DIAGNOSIS — M79675 Pain in left toe(s): Secondary | ICD-10-CM | POA: Diagnosis not present

## 2023-05-11 DIAGNOSIS — M79672 Pain in left foot: Secondary | ICD-10-CM | POA: Diagnosis not present

## 2023-05-11 DIAGNOSIS — L110 Acquired keratosis follicularis: Secondary | ICD-10-CM | POA: Diagnosis not present

## 2023-05-11 DIAGNOSIS — I739 Peripheral vascular disease, unspecified: Secondary | ICD-10-CM | POA: Diagnosis not present

## 2023-05-21 ENCOUNTER — Ambulatory Visit (INDEPENDENT_AMBULATORY_CARE_PROVIDER_SITE_OTHER): Payer: 59 | Admitting: Internal Medicine

## 2023-05-21 ENCOUNTER — Encounter: Payer: Self-pay | Admitting: Internal Medicine

## 2023-05-21 VITALS — BP 114/74 | HR 83 | Temp 98.2°F | Ht 71.0 in | Wt 148.8 lb

## 2023-05-21 DIAGNOSIS — K5900 Constipation, unspecified: Secondary | ICD-10-CM | POA: Diagnosis not present

## 2023-05-21 DIAGNOSIS — Z86010 Personal history of colonic polyps: Secondary | ICD-10-CM

## 2023-05-21 MED ORDER — OMEPRAZOLE 40 MG PO CPDR: 40.0000 mg | DELAYED_RELEASE_CAPSULE | Freq: Two times a day (BID) | ORAL | 1 refills | Status: DC

## 2023-05-21 NOTE — Progress Notes (Unsigned)
Primary Care Physician:  [facility] Primary Gastroenterologist:  Dr. Jena Gauss  Pre-Procedure History & Physical: HPI:  Daniel Duffy is a 72 y.o. male here for follow-up of chest pain, melena, weight loss.  Negative EGD and colonoscopy recently.  Was visiting in [place] developed chest pain went to the ER.  Cardiac workup negative.  They increased his omeprazole to 40 mg twice daily.  This was about a month ago.  He says since that time, his symptoms have resolved.  He has good bowel function; appetite has picked up; no reflux symptoms, chest pain, odynophagia or dysphagia.  Weight is stable today.  He is very happy.  Past Medical History:  Diagnosis Date   Abnormal weight loss    Acid reflux    Acute prostatitis    Acute sinusitis, unspecified    Chronic idiopathic constipation    Colon polyps    Enlarged prostate    Hyperlipidemia, unspecified    Incisional hernia without obstruction or gangrene    Lumbago    Nonspecific elevation of levels of transaminase and lactic acid dehydrogenase (LDH)    Other male erectile dysfunction    Other obstructive and reflux uropathy    Radiculopathy, lumbar region    Rash and other nonspecific skin eruption    Reflux esophagitis     Past Surgical History:  Procedure Laterality Date   BIOPSY  04/05/2023   Procedure: BIOPSY;  Surgeon: Corbin Ade, MD;  [facility];  Service: Endoscopy;;   COLONOSCOPY  12/29/2005   [id number] hemorrhoids, anal papilla/Diminutive polyp on a stalk at 10 cm/otherwise normal rectum and colon, PATH: hyperplastic polyp   COLONOSCOPY N/A 04/10/2013   RMR: tubular adenoma: next TCS 04/2018   COLONOSCOPY WITH PROPOFOL N/A 02/10/2018   Surgeon: Corbin Ade, MD;  pancolonic diverticulosis, otherwise normal exam.  Recommended repeat colonoscopy in 5 years for surveillance due to history of colon polyps.   COLONOSCOPY WITH PROPOFOL N/A 04/05/2023   Procedure: COLONOSCOPY  WITH PROPOFOL;  Surgeon: Corbin Ade, MD;  [facility];  Service: Endoscopy;  Laterality: N/A;  11:15 am   ESOPHAGOGASTRODUODENOSCOPY  12/29/2005   [id number] Schatzki's ring, small hiatal hernia, normal gastric mucosa, normal D1 and D2.    ESOPHAGOGASTRODUODENOSCOPY (EGD) WITH PROPOFOL N/A 04/05/2023   Procedure: ESOPHAGOGASTRODUODENOSCOPY (EGD) WITH PROPOFOL;  Surgeon: Corbin Ade, MD;  [facility];  Service: Endoscopy;  Laterality: N/A;   HAND SURGERY     bilateral, got caught in a machine, two different occasions, right hand originally, than left hand. Left hand with skin graft   SPINAL CORD DECOMPRESSION      Prior to Admission medications   Medication Sig Start Date End Date Taking? Authorizing Provider  ammonium lactate (AMLACTIN) 12 % cream Apply topically 2 (two) times daily. 05/11/23  Yes [provider]  aspirin EC 81 MG tablet Take 81 mg by mouth daily. 04/28/23 04/27/24 Yes [provider]  atorvastatin (LIPITOR) 40 MG tablet Take 40 mg by mouth at bedtime. 04/27/23 04/26/24 Yes [provider]  Multiple Vitamins-Minerals (MULTIVITAMINS THER. W/MINERALS) TABS Take 1 tablet by mouth daily.   Yes [provider]  omeprazole (PRILOSEC) 40 MG capsule Take 40 mg by mouth 2 (two) times daily. 04/27/23 05/27/23 Yes [provider]  polyethylene glycol (MIRALAX) 17 g packet Take 17 g by mouth daily. 07/30/22  Yes Gareth Eagle, PA-C  tamsulosin (FLOMAX) 0.4 MG CAPS capsule Take 0.4 mg  by mouth in the morning.   Yes [provider]    Allergies as of 05/21/2023 - Review Complete 05/21/2023  Allergen Reaction Noted   Nitrates, organic Other (See Comments) 02/12/2022    Family History  Problem Relation Age of Onset   CAD Brother    Colon polyps Brother     Social History   Socioeconomic History   Marital status: Divorced    Spouse name: Not on file   Number of children: Not on file   Years of  education: Not on file   Highest education level: Not on file  Occupational History   Not on file  Tobacco Use   Smoking status: Former   Smokeless tobacco: Never  Vaping Use   Vaping Use: Never used  Substance and Sexual Activity   Alcohol use: No   Drug use: No   Sexual activity: Never  Other Topics Concern   Not on file  Social History Narrative   Not on file   Social Determinants of Health   Financial Resource Strain: Not on file  Food Insecurity: Not on file  Transportation Needs: Not on file  Physical Activity: Not on file  Stress: Not on file  Social Connections: Not on file  Intimate Partner Violence: Not on file    Review of Systems: See HPI, otherwise negative ROS  Physical Exam: BP 114/74 (BP Location: Right Arm, Patient Position: Sitting, Cuff Size: Normal)   Pulse 83   Temp 98.2 F (36.8 C) (Oral)   Ht 5\' 11"  (1.803 m)   Wt 148 lb 12.8 oz (67.5 kg)   SpO2 95%   BMI 20.75 kg/m  General:   Alert,  Well-developed, well-nourished, pleasant and cooperative in NAD Neck:  Supple; no masses or thyromegaly. No significant cervical adenopathy. Lungs:  Clear throughout to auscultation.   No wheezes, crackles, or rhonchi. No acute distress. Heart:  Regular rate and rhythm; no murmurs, clicks, rubs,  or gallops. Abdomen: Non-distended, normal bowel sounds.  Soft and nontender without appreciable mass or hepatosplenomegaly.   Impression/Plan: Pleasant 72 year old gentleman with chest pain;  in retrospect, most likely related to GERD with negative cardiac workup.  History of melena with essentially negative EGD and colonoscopy appetite has picked up - weight is stable.  History of colonic adenoma-due for surveillance in 7 years.  Clinically, Mr. Ricarte is doing extremely well.  Recommendations:  Continue omeprazole 40 mg twice daily for the next 6 months-best taken 30 minutes before breakfast and supper.  Plan for colonoscopy-surveillance purposes  2031  Office visit with me in 6 months.   Notice: This dictation was prepared with Dragon dictation along with smaller phrase technology. Any transcriptional errors that result from this process are unintentional and may not be corrected upon review.

## 2023-05-21 NOTE — Patient Instructions (Signed)
It was good to see you again today!  Glad you are feeling better  Continue omeprazole 40 mg twice daily-best taken 30 minutes before meals (new prescription dispense 180 with 1 additional refill)  GERD information provided  Office visit with me in 6 months

## 2023-06-21 DIAGNOSIS — S80861A Insect bite (nonvenomous), right lower leg, initial encounter: Secondary | ICD-10-CM | POA: Diagnosis not present

## 2023-06-21 DIAGNOSIS — Z6820 Body mass index (BMI) 20.0-20.9, adult: Secondary | ICD-10-CM | POA: Diagnosis not present

## 2023-07-06 ENCOUNTER — Other Ambulatory Visit (HOSPITAL_COMMUNITY)
Admission: RE | Admit: 2023-07-06 | Discharge: 2023-07-06 | Disposition: A | Discharge status: Home / Self Care | Payer: 59 | Source: Ambulatory Visit | Attending: Student | Admitting: Student

## 2023-07-06 DIAGNOSIS — E785 Hyperlipidemia, unspecified: Secondary | ICD-10-CM | POA: Insufficient documentation

## 2023-07-06 LAB — COMPREHENSIVE METABOLIC PANEL
ALT: 41 U/L (ref 0–44)
AST: 80 U/L — ABNORMAL HIGH (ref 15–41)
Albumin: 3.5 g/dL (ref 3.5–5.0)
Alkaline Phosphatase: 80 U/L (ref 38–126)
Anion gap: 7 (ref 5–15)
BUN: 11 mg/dL (ref 8–23)
CO2: 20 mmol/L — ABNORMAL LOW (ref 22–32)
Calcium: 9.5 mg/dL (ref 8.9–10.3)
Chloride: 108 mmol/L (ref 98–111)
Creatinine, Ser: 0.97 mg/dL (ref 0.61–1.24)
GFR, Estimated: >60 mL/min (ref >60)
Glucose, Bld: 105 mg/dL — ABNORMAL HIGH (ref 70–99)
Potassium: 3.6 mmol/L (ref 3.5–5.1)
Sodium: 135 mmol/L (ref 135–145)
Total Bilirubin: 0.7 mg/dL (ref 0.3–1.2)
Total Protein: 6.6 g/dL (ref 6.5–8.1)

## 2023-07-06 LAB — LIPID PANEL
Cholesterol: 140 mg/dL (ref 0–200)
HDL: 47 mg/dL (ref >40)
LDL Cholesterol: 83 mg/dL (ref 0–99)
Total CHOL/HDL Ratio: 3 RATIO
Triglycerides: 48 mg/dL (ref <150)
VLDL: 10 mg/dL (ref 0–40)

## 2023-07-12 ENCOUNTER — Telehealth: Payer: Self-pay | Admitting: *Deleted

## 2023-07-12 ENCOUNTER — Telehealth: Payer: Self-pay | Admitting: Cardiology

## 2023-07-12 DIAGNOSIS — R7989 Other specified abnormal findings of blood chemistry: Secondary | ICD-10-CM

## 2023-07-12 NOTE — Telephone Encounter (Signed)
Returned call to pt. On home and cell phone. No answer.

## 2023-07-12 NOTE — Telephone Encounter (Signed)
Returned call to pt. On cell phone [phone])  No answer. No voicemail

## 2023-07-12 NOTE — Telephone Encounter (Signed)
-----   Message from Ellsworth Lennox sent at 07/06/2023 11:22 AM EDT ----- Please let the patient know that his electrolytes and kidney function are within a normal range. His ALT (liver function test) is normal but AST is elevated at 80. Cholesterol is significantly improved with total cholesterol previously at 244 and now down to 140 and LDL previously at 145 and down to 83. I would recommend rechecking LFT's in 1 month. If he consumes any alcohol, would limit to no more than 2 drinks per day. If AST remains elevated, we may need to reduce Atorvastatin to 20mg  daily.

## 2023-07-12 NOTE — Telephone Encounter (Signed)
Patient is returning call to discuss lab results. Patient requested a call back on his cell phone at [phone] if the call does not go through on his house phone.

## 2023-07-12 NOTE — Telephone Encounter (Signed)
  Pt is returning call, he said, to call his cell number instead [phone]

## 2023-07-12 NOTE — Telephone Encounter (Signed)
Pt notified of results

## 2023-07-12 NOTE — Telephone Encounter (Signed)
Pt notified of test results and order placed.

## 2023-08-12 ENCOUNTER — Other Ambulatory Visit (HOSPITAL_COMMUNITY)
Admission: RE | Admit: 2023-08-12 | Discharge: 2023-08-12 | Disposition: A | Discharge status: Home / Self Care | Payer: 59 | Source: Ambulatory Visit | Attending: Student | Admitting: Student

## 2023-08-12 DIAGNOSIS — R7989 Other specified abnormal findings of blood chemistry: Secondary | ICD-10-CM | POA: Diagnosis not present

## 2023-08-12 LAB — HEPATIC FUNCTION PANEL
ALT: 29 U/L (ref 0–44)
AST: 38 U/L (ref 15–41)
Albumin: 3.8 g/dL (ref 3.5–5.0)
Alkaline Phosphatase: 94 U/L (ref 38–126)
Bilirubin, Direct: 0.1 mg/dL (ref 0.0–0.2)
Indirect Bilirubin: 0.6 mg/dL (ref 0.3–0.9)
Total Bilirubin: 0.7 mg/dL (ref 0.3–1.2)
Total Protein: 6.8 g/dL (ref 6.5–8.1)

## 2023-08-13 ENCOUNTER — Telehealth: Payer: Self-pay | Admitting: Cardiology

## 2023-08-13 NOTE — Telephone Encounter (Signed)
Patient notified and verbalized understanding. PCP copied. 

## 2023-08-13 NOTE — Telephone Encounter (Signed)
Patient returned RN's call regarding results. 

## 2023-08-13 NOTE — Telephone Encounter (Signed)
-----   Message from Ellsworth Lennox sent at 08/12/2023  7:35 PM EDT ----- Please let the patient know his liver function has normalized by repeat labs! Continue current medication regimen.

## 2023-08-17 DIAGNOSIS — I739 Peripheral vascular disease, unspecified: Secondary | ICD-10-CM | POA: Diagnosis not present

## 2023-08-17 DIAGNOSIS — M79672 Pain in left foot: Secondary | ICD-10-CM | POA: Diagnosis not present

## 2023-08-17 DIAGNOSIS — M79674 Pain in right toe(s): Secondary | ICD-10-CM | POA: Diagnosis not present

## 2023-08-17 DIAGNOSIS — M79671 Pain in right foot: Secondary | ICD-10-CM | POA: Diagnosis not present

## 2023-08-17 DIAGNOSIS — M79675 Pain in left toe(s): Secondary | ICD-10-CM | POA: Diagnosis not present

## 2023-08-17 DIAGNOSIS — L110 Acquired keratosis follicularis: Secondary | ICD-10-CM | POA: Diagnosis not present

## 2023-08-30 ENCOUNTER — Encounter: Payer: Self-pay | Admitting: Internal Medicine

## 2023-09-02 ENCOUNTER — Telehealth: Payer: Self-pay | Admitting: Internal Medicine

## 2023-09-02 ENCOUNTER — Other Ambulatory Visit: Payer: Self-pay | Admitting: *Deleted

## 2023-09-02 DIAGNOSIS — K824 Cholesterolosis of gallbladder: Secondary | ICD-10-CM

## 2023-09-02 NOTE — Telephone Encounter (Signed)
[facility]  [place] scheduled for Friday 09/10/23, arrive at 8:15 am, NPO after midnight.

## 2023-09-02 NOTE — Telephone Encounter (Signed)
Pt received letter to schedule his U/S [phone]

## 2023-09-06 NOTE — Telephone Encounter (Signed)
Pt returned call   [facility]

## 2023-09-06 NOTE — Telephone Encounter (Signed)
Pt came by office and information was written down for him regarding his [place] appointment by Darl Pikes.

## 2023-09-07 DIAGNOSIS — Z6821 Body mass index (BMI) 21.0-21.9, adult: Secondary | ICD-10-CM | POA: Diagnosis not present

## 2023-09-07 DIAGNOSIS — B308 Other viral conjunctivitis: Secondary | ICD-10-CM | POA: Diagnosis not present

## 2023-09-07 DIAGNOSIS — M5030 Other cervical disc degeneration, unspecified cervical region: Secondary | ICD-10-CM | POA: Diagnosis not present

## 2023-09-10 ENCOUNTER — Ambulatory Visit (HOSPITAL_COMMUNITY)
Admission: RE | Admit: 2023-09-10 | Discharge: 2023-09-10 | Disposition: A | Discharge status: Home / Self Care | Payer: 59 | Source: Ambulatory Visit | Attending: Gastroenterology | Admitting: Gastroenterology

## 2023-09-10 DIAGNOSIS — K824 Cholesterolosis of gallbladder: Secondary | ICD-10-CM | POA: Insufficient documentation

## 2023-09-10 DIAGNOSIS — K7689 Other specified diseases of liver: Secondary | ICD-10-CM | POA: Diagnosis not present

## 2023-11-02 ENCOUNTER — Encounter: Payer: Self-pay | Admitting: Internal Medicine

## 2023-11-08 ENCOUNTER — Encounter: Payer: Self-pay | Admitting: Cardiology

## 2023-11-08 ENCOUNTER — Ambulatory Visit: Payer: 59 | Attending: Cardiology | Admitting: Cardiology

## 2023-11-08 VITALS — BP 132/84 | HR 77 | Ht 71.0 in | Wt 151.0 lb

## 2023-11-08 DIAGNOSIS — R0789 Other chest pain: Secondary | ICD-10-CM | POA: Diagnosis not present

## 2023-11-08 DIAGNOSIS — E785 Hyperlipidemia, unspecified: Secondary | ICD-10-CM | POA: Diagnosis not present

## 2023-11-08 NOTE — Patient Instructions (Signed)
Medication Instructions:  Your physician recommends that you continue on your current medications as directed. Please refer to the Current Medication list given to you today.  *If you need a refill on your cardiac medications before your next appointment, please call your pharmacy*   Lab Work: Noen If you have labs (blood work) drawn today and your tests are completely normal, you will receive your results only by: MyChart Message (if you have MyChart) OR A paper copy in the mail If you have any lab test that is abnormal or we need to change your treatment, we will call you to review the results.   Testing/Procedures: None   Follow-Up: At [facility], you and your health needs are our priority.  As part of our continuing mission to provide you with exceptional heart care, we have created designated Provider Care Teams.  These Care Teams include your primary Cardiologist (physician) and Advanced Practice Providers (APPs -  Physician Assistants and Nurse Practitioners) who all work together to provide you with the care you need, when you need it.  We recommend signing up for the patient portal called "MyChart".  Sign up information is provided on this After Visit Summary.  MyChart is used to connect with patients for Virtual Visits (Telemedicine).  Patients are able to view lab/test results, encounter notes, upcoming appointments, etc.  Non-urgent messages can be sent to your provider as well.   To learn more about what you can do with MyChart, go to [email or web address].    Your next appointment:   1 year(s)  Provider:   You may see Dina Rich, MD or one of the following Advanced Practice Providers on your designated Care Team:   Randall An, PA-C  Jacolyn Reedy, [place]     Other Instructions

## 2023-11-08 NOTE — Progress Notes (Signed)
Clinical Summary Mr. Ogburn is a 72 y.o.male seen today for follow up of the following medical problems.    1. Chest pain - seen in ER Jan 10,2022 with chest pain - trop neg x2, ddimer neg, COVID neg - CXR no acute process - EKG poor data quality, some evidence of diffuse ST elevation possibly consistent with pericarditis - ED started on a steroid course for possible pericarditis.  Jan 2022 CRP normal, ESR normal Jan 2022 echo LVEF 55-60%, no WMAs, normal RV function, no effusion   04/2023 admission at [facility] hospital with chest pain 04/2023 echo: LVEF 50-55%, grade I DD 04/2023 nuclear stress: stress images demonstrate a defect that is medium to large in size present in the entire inferior and inferolateral location(s) that is fixed on the resting images. Fixed defect present, which may be due to tissue attenuation artifact versus scar and there is no evidence of ischemia.    -denies any recent symptoms.    2. HLD - 06/2023 TC 140 TG 48 HDL 47 LDL 83  Past Medical History:  Diagnosis Date   Abnormal weight loss    Acid reflux    Acute prostatitis    Acute sinusitis, unspecified    Chronic idiopathic constipation    Colon polyps    Enlarged prostate    Hyperlipidemia, unspecified    Incisional hernia without obstruction or gangrene    Lumbago    Nonspecific elevation of levels of transaminase and lactic acid dehydrogenase (LDH)    Other male erectile dysfunction    Other obstructive and reflux uropathy    Radiculopathy, lumbar region    Rash and other nonspecific skin eruption    Reflux esophagitis      Allergies  Allergen Reactions   Nitrates, Organic Other (See Comments)    Could not control of body     Current Outpatient Medications  Medication Sig Dispense Refill   ammonium lactate (AMLACTIN) 12 % cream Apply topically 2 (two) times daily.     aspirin EC 81 MG tablet Take 81 mg by mouth daily.     atorvastatin (LIPITOR) 40 MG tablet Take 40 mg by  mouth at bedtime.     Multiple Vitamins-Minerals (MULTIVITAMINS THER. W/MINERALS) TABS Take 1 tablet by mouth daily.     omeprazole (PRILOSEC) 40 MG capsule Take 1 capsule (40 mg total) by mouth 2 (two) times daily. 180 capsule 1   polyethylene glycol (MIRALAX) 17 g packet Take 17 g by mouth daily. 14 each 0   tamsulosin (FLOMAX) 0.4 MG CAPS capsule Take 0.4 mg by mouth in the morning.     No current facility-administered medications for this visit.     Past Surgical History:  Procedure Laterality Date   BIOPSY  04/05/2023   Procedure: BIOPSY;  Surgeon: Corbin Ade, MD;  [facility];  Service: Endoscopy;;   COLONOSCOPY  12/29/2005   [id number] hemorrhoids, anal papilla/Diminutive polyp on a stalk at 10 cm/otherwise normal rectum and colon, PATH: hyperplastic polyp   COLONOSCOPY N/A 04/10/2013   RMR: tubular adenoma: next TCS 04/2018   COLONOSCOPY WITH PROPOFOL N/A 02/10/2018   Surgeon: Corbin Ade, MD;  pancolonic diverticulosis, otherwise normal exam.  Recommended repeat colonoscopy in 5 years for surveillance due to history of colon polyps.   COLONOSCOPY WITH PROPOFOL N/A 04/05/2023   Procedure: COLONOSCOPY WITH PROPOFOL;  Surgeon: Corbin Ade, MD;  [facility];  Service: Endoscopy;  Laterality: N/A;  11:15 am  ESOPHAGOGASTRODUODENOSCOPY  12/29/2005   [id number] Schatzki's ring, small hiatal hernia, normal gastric mucosa, normal D1 and D2.    ESOPHAGOGASTRODUODENOSCOPY (EGD) WITH PROPOFOL N/A 04/05/2023   Procedure: ESOPHAGOGASTRODUODENOSCOPY (EGD) WITH PROPOFOL;  Surgeon: Corbin Ade, MD;  [facility];  Service: Endoscopy;  Laterality: N/A;   HAND SURGERY     bilateral, got caught in a machine, two different occasions, right hand originally, than left hand. Left hand with skin graft   SPINAL CORD DECOMPRESSION       Allergies  Allergen Reactions   Nitrates, Organic Other (See Comments)    Could not control of body       Family History  Problem Relation Age of Onset   CAD Brother    Colon polyps Brother      Social History Mr. Cutrer reports that he has quit smoking. He has never used smokeless tobacco. Mr. Ebben reports no history of alcohol use.   Review of Systems CONSTITUTIONAL: No weight loss, fever, chills, weakness or fatigue.  HEENT: Eyes: No visual loss, blurred vision, double vision or yellow sclerae.No hearing loss, sneezing, congestion, runny nose or sore throat.  SKIN: No rash or itching.  CARDIOVASCULAR: per hpi RESPIRATORY: No shortness of breath, cough or sputum.  GASTROINTESTINAL: No anorexia, nausea, vomiting or diarrhea. No abdominal pain or blood.  GENITOURINARY: No burning on urination, no polyuria NEUROLOGICAL: No headache, dizziness, syncope, paralysis, ataxia, numbness or tingling in the extremities. No change in bowel or bladder control.  MUSCULOSKELETAL: No muscle, back pain, joint pain or stiffness.  LYMPHATICS: No enlarged nodes. No history of splenectomy.  PSYCHIATRIC: No history of depression or anxiety.  ENDOCRINOLOGIC: No reports of sweating, cold or heat intolerance. No polyuria or polydipsia.  Marland Kitchen   Physical Examination Today's Vitals   11/08/23 0842  BP: 132/84  Pulse: 77  SpO2: 97%  Weight: 151 lb (68.5 kg)  Height: 5\' 11"  (1.803 m)   Body mass index is 21.06 kg/m.  Gen: resting comfortably, no acute distress HEENT: no scleral icterus, pupils equal round and reactive, no palptable cervical adenopathy,  CV: RRR, no m/rg no jvd Resp: Clear to auscultation bilaterally GI: abdomen is soft, non-tender, non-distended, normal bowel sounds, no hepatosplenomegaly MSK: extremities are warm, no edema.  Skin: warm, no rash Neuro:  no focal deficits Psych: appropriate affect   Diagnostic Studies  Jan 2022 echo IMPRESSIONS     1. Left ventricular ejection fraction, by estimation, is 55 to 60%. The  left ventricle has normal function. The  left ventricle has no regional  wall motion abnormalities. Left ventricular diastolic parameters are  indeterminate.   2. Right ventricular systolic function is normal. The right ventricular  size is normal. There is normal pulmonary artery systolic pressure. The  estimated right ventricular systolic pressure is 16.0 mmHg.   3. The mitral valve is grossly normal. Trivial mitral valve  regurgitation.   4. The aortic valve is tricuspid. Aortic valve regurgitation is not  visualized.   5. The inferior vena cava is normal in size with greater than 50%  respiratory variability, suggesting right atrial pressure of 3 mmHg.    04/2023 nuclear stress [facility]  This result has an attachment that is not available.    Perfusion Imaging: There is no evidence of transient ischemic dilation  (TID), the value is 0.72. Stress images demonstrate a defect that is  medium to large in size present in the entire inferior and inferolateral  location(s) that is fixed on  the resting images. Fixed defect present,  which may be due to tissue attenuation artifact versus scar and there is  no evidence of ischemia.    Left Ventricular Function: Normal wall motion and normal systolic  function.   A pharmacological stress test was performed using 0.4mg /65ml of  Regadenson (Lexiscan) given intravenously over 10 seconds. The patient  reported no symptoms during the stress test.    Stress ECG Findings: There were no arrhythmias.    Recovery ECG Findings: There were no arrhythmias.   Resting ECG  The resting ECG shows normal sinus rhythm. ST deviation is normal.   Stress Findings  Patient informed consent given: The nature of the procedure and its benefits were discussed with the patient. Risks include, but are not limited to: if given, radiopharmaceutical agent, Lexiscan, Dobutamine, Aminophylline and reversal agents, inability to complete the study, arrhythmias, MI and death. The patient had no further questions  and agreed to proceed.   A pharmacological stress test was performed using 0.4mg /69ml of Regadenson (Lexiscan) given intravenously over 10 seconds. The patient and had a maximal HR of 97 bpm (% of MPHR) METS. Baseline HR: 72 bpm. Peak HR: 97 bpm. Target HR: 128 bpm. Resting BP: 130/80 mmHg. Peak BP: 147/81 mmHg. The patient reported no symptoms during the stress test. The exercise test was stopped because the technologist observed protocol complete.   Stress ECG  There were no arrhythmias.   Isotope Administration  The isotope used for nuclear imaging was myoview. Imaging was performed at rest after an injection on 04/27/2023 of 10.0 mCi intravenously. Imaging was performed at peak stress after an injection on 04/27/2023 of 28.0 mCi intravenously.   Nuclear Study Quality  A perfusion 1-day rest/stress protocol was performed. Overall image quality is good. Bowel attenuation and subdiaphragmatic activity There was no increased lung uptake of the radiopharmaceutical.   Perfusion Defect  There is no evidence of transient ischemic dilation (TID), the value is 0.72. Stress images demonstrate a defect that is medium to large in size present in the entire inferior and inferolateral location(s) that is fixed on the resting images. Fixed defect present, which may be due to tissue attenuation artifact versus scar and there is no evidence of ischemia.   Stress Function Defect  Normal wall motion and normal systolic function. The gated LV ejection fraction is 79%.   04/2023 echo [facility] This result has an attachment that is not available.    Left Ventricle: Size is normal. Normal wall thickness. Normal wall  motion. Normal LV systolic function with an approximate EF of 50 - 55%.  Grade I: There is impaired left ventricular relaxation.    Right Ventricle: Size is normal. Normal systolic function.    Aortic Valve: Normal valve structure. Trileaflet.    Tricuspid Valve: There is no pulmonary  hypertension. RVSP is 20 mmHg.  Mild regurgitation.    A complete 2D, color flow doppler and spectral doppler echocardiogram  was performed.   Assessment and Plan  1. Chest pain -no recent symptoms. Recent workup at OSH was benign including echo and nuclear stress test - continue to monitor at this time - EKG today shows SR, no ischemic changes  2. HLD - at goal, continue current meds   F/u 1 year  Antoine Poche, M.D.

## 2023-11-16 DIAGNOSIS — M79672 Pain in left foot: Secondary | ICD-10-CM | POA: Diagnosis not present

## 2023-11-16 DIAGNOSIS — M79671 Pain in right foot: Secondary | ICD-10-CM | POA: Diagnosis not present

## 2023-11-16 DIAGNOSIS — L110 Acquired keratosis follicularis: Secondary | ICD-10-CM | POA: Diagnosis not present

## 2023-11-16 DIAGNOSIS — M79674 Pain in right toe(s): Secondary | ICD-10-CM | POA: Diagnosis not present

## 2023-11-16 DIAGNOSIS — I739 Peripheral vascular disease, unspecified: Secondary | ICD-10-CM | POA: Diagnosis not present

## 2023-11-16 DIAGNOSIS — M79675 Pain in left toe(s): Secondary | ICD-10-CM | POA: Diagnosis not present

## 2023-11-19 ENCOUNTER — Ambulatory Visit: Payer: 59 | Admitting: Internal Medicine

## 2023-11-19 VITALS — BP 126/79 | HR 76 | Temp 98.1°F | Ht 71.0 in | Wt 156.4 lb

## 2023-11-19 DIAGNOSIS — K219 Gastro-esophageal reflux disease without esophagitis: Secondary | ICD-10-CM | POA: Diagnosis not present

## 2023-11-19 DIAGNOSIS — K5900 Constipation, unspecified: Secondary | ICD-10-CM | POA: Diagnosis not present

## 2023-11-19 DIAGNOSIS — Z860100 Personal history of colon polyps, unspecified: Secondary | ICD-10-CM | POA: Diagnosis not present

## 2023-11-19 DIAGNOSIS — R634 Abnormal weight loss: Secondary | ICD-10-CM

## 2023-11-19 DIAGNOSIS — R932 Abnormal findings on diagnostic imaging of liver and biliary tract: Secondary | ICD-10-CM

## 2023-11-19 NOTE — Patient Instructions (Signed)
It was good to see you again today!  As discussed, I think we can decrease the pantoprazole or Protonix to 40 mg once daily best taken 30 minutes before breakfast.  If you experience any chest discomfort or acid reflux with decreasing this medication, please call me  Continue MiraLAX as needed for constipation  Plan for 1 more colonoscopy in 7 years  Office visit in 6 months

## 2023-11-19 NOTE — Progress Notes (Unsigned)
Primary Care Physician:  [facility] Primary Gastroenterologist:  Dr. Jena Gauss  Pre-Procedure History & Physical: HPI:  Daniel Duffy is a 72 y.o. male here for follow-up of chest pain likely secondary to GERD. Mr. Daniel Duffy is doing well very well Protonix 40 mg twice daily.  No reflux symptoms or chest discomfort no dysphagia.  Appetite is getting better.  He is gained about 8 pounds since his last visit.  He does take MiraLAX occasionally for constipation-works very well.  History of isolated elevated AST earlier this year and subsequently an isolated elevated alkaline phosphatase; subsequent LFTs completely normal.  He has known simple cyst in his liver previous history of a gallbladder polyp seen on ultrasound but it was no longer present on subsequent ultrasonography. Colonoscopy negative (history of colon polyps) due for 1 more in 7 years if overall health permits.  Gastric biopsies negative for malignancy/H. pylori.  Procedures done earlier this year.  Past Medical History:  Diagnosis Date   Abnormal weight loss    Acid reflux    Acute prostatitis    Acute sinusitis, unspecified    Chronic idiopathic constipation    Colon polyps    Enlarged prostate    Hyperlipidemia, unspecified    Incisional hernia without obstruction or gangrene    Lumbago    Nonspecific elevation of levels of transaminase and lactic acid dehydrogenase (LDH)    Other male erectile dysfunction    Other obstructive and reflux uropathy    Radiculopathy, lumbar region    Rash and other nonspecific skin eruption    Reflux esophagitis     Past Surgical History:  Procedure Laterality Date   BIOPSY  04/05/2023   Procedure: BIOPSY;  Surgeon: Corbin Ade, MD;  [facility];  Service: Endoscopy;;   COLONOSCOPY  12/29/2005   [id number] hemorrhoids, anal papilla/Diminutive polyp on a stalk at 10 cm/otherwise normal rectum and colon, PATH: hyperplastic polyp   COLONOSCOPY  N/A 04/10/2013   RMR: tubular adenoma: next TCS 04/2018   COLONOSCOPY WITH PROPOFOL N/A 02/10/2018   Surgeon: Corbin Ade, MD;  pancolonic diverticulosis, otherwise normal exam.  Recommended repeat colonoscopy in 5 years for surveillance due to history of colon polyps.   COLONOSCOPY WITH PROPOFOL N/A 04/05/2023   Procedure: COLONOSCOPY WITH PROPOFOL;  Surgeon: Corbin Ade, MD;  [facility];  Service: Endoscopy;  Laterality: N/A;  11:15 am   ESOPHAGOGASTRODUODENOSCOPY  12/29/2005   [id number] Schatzki's ring, small hiatal hernia, normal gastric mucosa, normal D1 and D2.    ESOPHAGOGASTRODUODENOSCOPY (EGD) WITH PROPOFOL N/A 04/05/2023   Procedure: ESOPHAGOGASTRODUODENOSCOPY (EGD) WITH PROPOFOL;  Surgeon: Corbin Ade, MD;  [facility];  Service: Endoscopy;  Laterality: N/A;   HAND SURGERY     bilateral, got caught in a machine, two different occasions, right hand originally, than left hand. Left hand with skin graft   SPINAL CORD DECOMPRESSION      Prior to Admission medications   Medication Sig Start Date End Date Taking? Authorizing Provider  ammonium lactate (AMLACTIN) 12 % cream Apply topically 2 (two) times daily. 05/11/23  Yes [provider]  aspirin EC 81 MG tablet Take 81 mg by mouth daily. 04/28/23 04/27/24 Yes [provider]  atorvastatin (LIPITOR) 40 MG tablet Take 40 mg by mouth at bedtime. 04/27/23 04/26/24 Yes [provider]  Multiple Vitamins-Minerals (MULTIVITAMINS THER. W/MINERALS) TABS Take 1 tablet by mouth daily.   Yes [provider]  neomycin-polymyxin b-dexamethasone (MAXITROL)  3.5-10000-0.1 SUSP Place 2 drops into both eyes 3 (three) times daily. 11/12/23  Yes [provider]  omeprazole (PRILOSEC) 40 MG capsule Take 1 capsule (40 mg total) by mouth 2 (two) times daily. 05/21/23  Yes Connell Bognar, [name], MD  polyethylene glycol (MIRALAX) 17 g packet Take 17 g by mouth daily. 07/30/22  Yes Gareth Eagle, PA-C  tamsulosin (FLOMAX) 0.4 MG CAPS capsule Take 0.4 mg by mouth in the morning.   Yes [provider]    Allergies as of 11/19/2023 - Review Complete 11/19/2023  Allergen Reaction Noted   Nitrates, organic Other (See Comments) 02/12/2022    Family History  Problem Relation Age of Onset   CAD Brother    Colon polyps Brother     Social History   Socioeconomic History   Marital status: Divorced    Spouse name: Not on file   Number of children: Not on file   Years of education: Not on file   Highest education level: Not on file  Occupational History   Not on file  Tobacco Use   Smoking status: Former   Smokeless tobacco: Never  Vaping Use   Vaping status: Never Used  Substance and Sexual Activity   Alcohol use: No   Drug use: No   Sexual activity: Never  Other Topics Concern   Not on file  Social History Narrative   Not on file   Social Drivers of Health   Financial Resource Strain: Not on file  Food Insecurity: Not on file  Transportation Needs: Not on file  Physical Activity: Not on file  Stress: Not on file  Social Connections: Not on file  Intimate Partner Violence: Not on file    Review of Systems: See HPI, otherwise negative ROS  Physical Exam: BP 126/79 (BP Location: Left Arm, Patient Position: Sitting, Cuff Size: Normal)   Pulse 76   Temp 98.1 F (36.7 C) (Temporal)   Ht 5\' 11"  (1.803 m)   Wt 156 lb 6.4 oz (70.9 kg)   BMI 21.81 kg/m  General:   Alert,  Well-developed, well-nourished, pleasant and cooperative in NAD Abdomen: Non-distended, normal bowel sounds.  Soft and nontender without appreciable mass or hepatosplenomegaly.   Impression/Plan: 72 year old gentleman with GERD manifest as chest pain earlier this year now well-controlled on twice daily PPI I suspect we can de-escalate therapy  Benign liver cyst on prior CT gallbladder polyp not borne out on subsequent ultrasounds.  Recent nonspecific elevation in alkaline  phosphatase and AST earlier this year with subsequent completely normal LFTs.  History of colonic polyps; due for 1 more colonoscopy in 7 years if overall health permits  Recommendations:    I think we can decrease the pantoprazole or Protonix to 40 mg once daily best taken 30 minutes before breakfast.  If you experience any chest discomfort or acid reflux with decreasing this medication, please call me  Continue MiraLAX as needed for constipation  Plan for 1 more colonoscopy in 7 years  Office visit in 6 months     Notice: This dictation was prepared with Dragon dictation along with smaller phrase technology. Any transcriptional errors that result from this process are unintentional and may not be corrected upon review.

## 2023-11-20 ENCOUNTER — Other Ambulatory Visit: Payer: Self-pay | Admitting: Internal Medicine

## 2023-12-13 DIAGNOSIS — Z6821 Body mass index (BMI) 21.0-21.9, adult: Secondary | ICD-10-CM | POA: Diagnosis not present

## 2023-12-13 DIAGNOSIS — L0292 Furuncle, unspecified: Secondary | ICD-10-CM | POA: Diagnosis not present

## 2023-12-14 DIAGNOSIS — H25093 Other age-related incipient cataract, bilateral: Secondary | ICD-10-CM | POA: Diagnosis not present

## 2023-12-14 DIAGNOSIS — H401133 Primary open-angle glaucoma, bilateral, severe stage: Secondary | ICD-10-CM | POA: Diagnosis not present

## 2023-12-18 ENCOUNTER — Emergency Department (HOSPITAL_COMMUNITY)
Admission: EM | Admit: 2023-12-18 | Discharge: 2023-12-18 | Disposition: A | Discharge status: Home / Self Care | Payer: 59 | Attending: Emergency Medicine | Admitting: Emergency Medicine

## 2023-12-18 ENCOUNTER — Encounter (HOSPITAL_COMMUNITY): Payer: Self-pay | Admitting: Emergency Medicine

## 2023-12-18 ENCOUNTER — Emergency Department (HOSPITAL_COMMUNITY): Payer: 59

## 2023-12-18 ENCOUNTER — Other Ambulatory Visit: Payer: Self-pay

## 2023-12-18 DIAGNOSIS — Z7982 Long term (current) use of aspirin: Secondary | ICD-10-CM | POA: Diagnosis not present

## 2023-12-18 DIAGNOSIS — R1013 Epigastric pain: Secondary | ICD-10-CM | POA: Diagnosis not present

## 2023-12-18 DIAGNOSIS — K2100 Gastro-esophageal reflux disease with esophagitis, without bleeding: Secondary | ICD-10-CM

## 2023-12-18 DIAGNOSIS — R0789 Other chest pain: Secondary | ICD-10-CM | POA: Diagnosis not present

## 2023-12-18 DIAGNOSIS — R079 Chest pain, unspecified: Secondary | ICD-10-CM | POA: Diagnosis not present

## 2023-12-18 LAB — TROPONIN I (HIGH SENSITIVITY)
Troponin I (High Sensitivity): 2 ng/L (ref <18)
Troponin I (High Sensitivity): <2 ng/L (ref <18)

## 2023-12-18 LAB — CBC
HCT: 40.6 % (ref 39.0–52.0)
Hemoglobin: 13.4 g/dL (ref 13.0–17.0)
MCH: 28.3 pg (ref 26.0–34.0)
MCHC: 33 g/dL (ref 30.0–36.0)
MCV: 85.8 fL (ref 80.0–100.0)
Platelets: 218 10*3/uL (ref 150–400)
RBC: 4.73 MIL/uL (ref 4.22–5.81)
RDW: 14.4 % (ref 11.5–15.5)
WBC: 4.9 10*3/uL (ref 4.0–10.5)
nRBC: 0 % (ref 0.0–0.2)

## 2023-12-18 LAB — BASIC METABOLIC PANEL
Anion gap: 6 (ref 5–15)
BUN: 14 mg/dL (ref 8–23)
CO2: 24 mmol/L (ref 22–32)
Calcium: 10.4 mg/dL — ABNORMAL HIGH (ref 8.9–10.3)
Chloride: 104 mmol/L (ref 98–111)
Creatinine, Ser: 1.12 mg/dL (ref 0.61–1.24)
GFR, Estimated: >60 mL/min (ref >60)
Glucose, Bld: 105 mg/dL — ABNORMAL HIGH (ref 70–99)
Potassium: 4 mmol/L (ref 3.5–5.1)
Sodium: 134 mmol/L — ABNORMAL LOW (ref 135–145)

## 2023-12-18 LAB — HEPATIC FUNCTION PANEL
ALT: 23 U/L (ref 0–44)
AST: 38 U/L (ref 15–41)
Albumin: 3.6 g/dL (ref 3.5–5.0)
Alkaline Phosphatase: 69 U/L (ref 38–126)
Bilirubin, Direct: 0.1 mg/dL (ref 0.0–0.2)
Indirect Bilirubin: 0.5 mg/dL (ref 0.3–0.9)
Total Bilirubin: 0.6 mg/dL (ref 0.0–1.2)
Total Protein: 6.7 g/dL (ref 6.5–8.1)

## 2023-12-18 LAB — LIPASE, BLOOD: Lipase: 27 U/L (ref 11–51)

## 2023-12-18 MED ORDER — MORPHINE SULFATE (PF) 2 MG/ML IV SOLN
2.0000 mg | Freq: Once | INTRAVENOUS | Status: AC
  Administered 2023-12-18: 2 mg via INTRAVENOUS
  Filled 2023-12-18: qty 1

## 2023-12-18 MED ORDER — PANTOPRAZOLE SODIUM 40 MG IV SOLR
40.0000 mg | Freq: Once | INTRAVENOUS | Status: AC
  Administered 2023-12-18: 40 mg via INTRAVENOUS
  Filled 2023-12-18: qty 10

## 2023-12-18 MED ORDER — ALUM & MAG HYDROXIDE-SIMETH 200-200-20 MG/5ML PO SUSP
30.0000 mL | Freq: Once | ORAL | Status: AC
  Administered 2023-12-18: 30 mL via ORAL
  Filled 2023-12-18: qty 30

## 2023-12-18 NOTE — ED Triage Notes (Signed)
 Pt c/o of chest pain that started at 2200 last night. States it is a constant pressure. Denies any other symptoms.

## 2023-12-18 NOTE — ED Notes (Addendum)
 Patient said he brought meds to hospital with him, but nurse who did the triage and his assigned nurse both said he only had his jacket with him for personal belongings. I looked in the treatment room including trash cans, drawers, behind bed, in the lobby, triage room, and all the wheelchairs and didn't see a bag meeting his description of a [employer] bag with medication in it anywhere.

## 2023-12-18 NOTE — ED Provider Notes (Signed)
  EMERGENCY DEPARTMENT AT [facility] Provider Note   CSN: [id number] Arrival date & time: 12/18/23  9176     History  Chief Complaint  Patient presents with   Chest Pain    Daniel Duffy is a 73 y.o. male.  Patient has a history of GERD and hyperlipidemia.  He has been complaining of epigastric discomfort.  The history is provided by the patient and medical records. No language interpreter was used.  Chest Pain Pain location:  Epigastric Pain quality: aching   Pain radiates to:  Does not radiate Pain severity:  Mild Timing:  Intermittent Progression:  Waxing and waning Chronicity:  Recurrent Context: not breathing   Associated symptoms: no abdominal pain, no back pain, no cough, no fatigue and no headache        Home Medications Prior to Admission medications   Medication Sig Start Date End Date Taking? Authorizing Provider  ammonium lactate (AMLACTIN) 12 % cream Apply topically 2 (two) times daily. 05/11/23   [provider]  aspirin EC 81 MG tablet Take 81 mg by mouth daily. 04/28/23 04/27/24  [provider]  atorvastatin  (LIPITOR) 40 MG tablet Take 40 mg by mouth at bedtime. 04/27/23 04/26/24  [provider]  Multiple Vitamins-Minerals (MULTIVITAMINS THER. W/MINERALS) TABS Take 1 tablet by mouth daily.    [provider]  neomycin-polymyxin b-dexamethasone  (MAXITROL) 3.5-10000-0.1 SUSP Place 2 drops into both eyes 3 (three) times daily. 11/12/23   [provider]  omeprazole  (PRILOSEC) 40 MG capsule Take 1 capsule (40 mg total) by mouth daily before breakfast. 11/23/23   Rudy Josette RAMAN, PA-C  polyethylene glycol (MIRALAX ) 17 g packet Take 17 g by mouth daily. 07/30/22   Wuthrich, John K, PA-C  tamsulosin  (FLOMAX ) 0.4 MG CAPS capsule Take 0.4 mg by mouth in the morning.    [provider]      Allergies    Nitrates, organic    Review of Systems   Review of Systems  Constitutional:  Negative  for appetite change and fatigue.  HENT:  Negative for congestion, ear discharge and sinus pressure.   Eyes:  Negative for discharge.  Respiratory:  Negative for cough.   Cardiovascular:  Positive for chest pain.  Gastrointestinal:  Negative for abdominal pain and diarrhea.  Genitourinary:  Negative for frequency and hematuria.  Musculoskeletal:  Negative for back pain.  Skin:  Negative for rash.  Neurological:  Negative for seizures and headaches.  Psychiatric/Behavioral:  Negative for hallucinations.     Physical Exam Updated Vital Signs BP (!) 143/94   Temp 98.1 F (36.7 C) (Oral)   Resp 18   Ht 5' 11 (1.803 m)   Wt 68 kg   BMI 20.92 kg/m  Physical Exam Vitals and nursing note reviewed.  Constitutional:      Appearance: He is well-developed.  HENT:     Head: Normocephalic.     Nose: Nose normal.  Eyes:     General: No scleral icterus.    Conjunctiva/sclera: Conjunctivae normal.  Neck:     Thyroid : No thyromegaly.  Cardiovascular:     Rate and Rhythm: Normal rate and regular rhythm.     Heart sounds: No murmur heard.    No friction rub. No gallop.  Pulmonary:     Breath sounds: No stridor. No wheezing or rales.  Chest:     Chest wall: No tenderness.  Abdominal:     General: There is no distension.     Tenderness: There  is abdominal tenderness. There is no rebound.  Musculoskeletal:        General: Normal range of motion.     Cervical back: Neck supple.  Lymphadenopathy:     Cervical: No cervical adenopathy.  Skin:    Findings: No erythema or rash.  Neurological:     Mental Status: He is alert and oriented to person, place, and time.     Motor: No abnormal muscle tone.     Coordination: Coordination normal.  Psychiatric:        Behavior: Behavior normal.     ED Results / Procedures / Treatments   Labs (all labs ordered are listed, but only abnormal results are displayed) Labs Reviewed  BASIC METABOLIC PANEL - Abnormal; Notable for the following  components:      Result Value   Sodium 134 (*)    Glucose, Bld 105 (*)    Calcium  10.4 (*)    All other components within normal limits  CBC  HEPATIC FUNCTION PANEL  LIPASE, BLOOD  TROPONIN I (HIGH SENSITIVITY)  TROPONIN I (HIGH SENSITIVITY)    EKG EKG Interpretation Date/Time:  Saturday December 18 2023 08:34:07 EST Ventricular Rate:  85 PR Interval:  181 QRS Duration:  83 QT Interval:  355 QTC Calculation: 423 R Axis:   77  Text Interpretation: Sinus rhythm Confirmed by Suzette Pac [phone]) on 12/18/2023 12:23:27 PM  Radiology DG Chest 2 View Result Date: 12/18/2023 CLINICAL DATA:  Acute chest pain beginning last night. EXAM: CHEST - 2 VIEW COMPARISON:  08/26/2022 FINDINGS: The heart size and mediastinal contours are within normal limits. Both lungs are clear. The visualized skeletal structures are unremarkable. IMPRESSION: No active cardiopulmonary disease. Electronically Signed   By: Norleen DELENA Kil M.D.   On: 12/18/2023 10:17    Procedures Procedures    Medications Ordered in ED Medications  alum & mag hydroxide-simeth (MAALOX/MYLANTA) 200-200-20 MG/5ML suspension 30 mL (30 mLs Oral Given 12/18/23 0858)  pantoprazole  (PROTONIX ) injection 40 mg (40 mg Intravenous Given 12/18/23 0858)  morphine  (PF) 2 MG/ML injection 2 mg (2 mg Intravenous Given 12/18/23 9141)    ED Course/ Medical Decision Making/ A&P                                 Medical Decision Making Amount and/or Complexity of Data Reviewed Labs: ordered. Radiology: ordered.  Risk OTC drugs. Prescription drug management.   Epigastric pain secondary to GERD.  Patient will increase his omeprazole  to twice a day and follow-up with PCP        Final Clinical Impression(s) / ED Diagnoses Final diagnoses:  None    Rx / DC Orders ED Discharge Orders     None         Suzette Pac, MD 12/20/23 1501

## 2023-12-18 NOTE — Discharge Instructions (Signed)
 Increase your omeprazole so you are taking it twice a day.   Follow-up with your doctor next week for recheck

## 2024-01-11 DIAGNOSIS — H401133 Primary open-angle glaucoma, bilateral, severe stage: Secondary | ICD-10-CM | POA: Diagnosis not present

## 2024-01-11 DIAGNOSIS — H25093 Other age-related incipient cataract, bilateral: Secondary | ICD-10-CM | POA: Diagnosis not present

## 2024-01-11 DIAGNOSIS — H43813 Vitreous degeneration, bilateral: Secondary | ICD-10-CM | POA: Diagnosis not present

## 2024-01-24 ENCOUNTER — Telehealth: Payer: Self-pay | Admitting: Internal Medicine

## 2024-01-24 NOTE — Telephone Encounter (Signed)
Pt was changed to once daily per last ov. Lmom for pt to return call.

## 2024-01-24 NOTE — Telephone Encounter (Signed)
Pt came to front window saying his omeprazole had been changed to twice a day instead of once a day and now he is needing a refill, He uses  [facility] on [address].

## 2024-01-24 NOTE — Telephone Encounter (Signed)
Spoke with pt and informed him that Dr. Jena Gauss stated  that it was ok for him to go to once daily. Pt verbalized understanding.

## 2024-01-31 ENCOUNTER — Other Ambulatory Visit: Payer: Self-pay | Admitting: Gastroenterology

## 2024-01-31 ENCOUNTER — Telehealth: Payer: Self-pay

## 2024-01-31 ENCOUNTER — Other Ambulatory Visit: Payer: Self-pay | Admitting: Internal Medicine

## 2024-01-31 NOTE — Telephone Encounter (Signed)
 Refill request has been sent to the provider to review.

## 2024-01-31 NOTE — Telephone Encounter (Signed)
 Patient came to the office to say that the omeprazole is not at the pharmacy for him.  He said he just checked on it and it isn't there.  I told him that I would put a note into you and if he wants to check with the pharmacy a little later he should and if it isn't still there he can call the office.

## 2024-02-02 ENCOUNTER — Other Ambulatory Visit: Payer: Self-pay

## 2024-02-02 ENCOUNTER — Other Ambulatory Visit: Payer: Self-pay | Admitting: Gastroenterology

## 2024-02-14 DIAGNOSIS — J309 Allergic rhinitis, unspecified: Secondary | ICD-10-CM | POA: Diagnosis not present

## 2024-02-14 DIAGNOSIS — R7309 Other abnormal glucose: Secondary | ICD-10-CM | POA: Diagnosis not present

## 2024-02-14 DIAGNOSIS — Z6821 Body mass index (BMI) 21.0-21.9, adult: Secondary | ICD-10-CM | POA: Diagnosis not present

## 2024-02-15 DIAGNOSIS — M79675 Pain in left toe(s): Secondary | ICD-10-CM | POA: Diagnosis not present

## 2024-02-15 DIAGNOSIS — L110 Acquired keratosis follicularis: Secondary | ICD-10-CM | POA: Diagnosis not present

## 2024-02-15 DIAGNOSIS — M79671 Pain in right foot: Secondary | ICD-10-CM | POA: Diagnosis not present

## 2024-02-15 DIAGNOSIS — M79674 Pain in right toe(s): Secondary | ICD-10-CM | POA: Diagnosis not present

## 2024-02-15 DIAGNOSIS — M79672 Pain in left foot: Secondary | ICD-10-CM | POA: Diagnosis not present

## 2024-02-15 DIAGNOSIS — L565 Disseminated superficial actinic porokeratosis (DSAP): Secondary | ICD-10-CM | POA: Diagnosis not present

## 2024-02-15 DIAGNOSIS — I739 Peripheral vascular disease, unspecified: Secondary | ICD-10-CM | POA: Diagnosis not present

## 2024-03-28 DIAGNOSIS — E785 Hyperlipidemia, unspecified: Secondary | ICD-10-CM | POA: Diagnosis not present

## 2024-03-28 DIAGNOSIS — Z6821 Body mass index (BMI) 21.0-21.9, adult: Secondary | ICD-10-CM | POA: Diagnosis not present

## 2024-03-28 DIAGNOSIS — R7309 Other abnormal glucose: Secondary | ICD-10-CM | POA: Diagnosis not present

## 2024-03-28 DIAGNOSIS — Z0001 Encounter for general adult medical examination with abnormal findings: Secondary | ICD-10-CM | POA: Diagnosis not present

## 2024-04-11 DIAGNOSIS — H43813 Vitreous degeneration, bilateral: Secondary | ICD-10-CM | POA: Diagnosis not present

## 2024-04-11 DIAGNOSIS — H401133 Primary open-angle glaucoma, bilateral, severe stage: Secondary | ICD-10-CM | POA: Diagnosis not present

## 2024-04-11 DIAGNOSIS — H25093 Other age-related incipient cataract, bilateral: Secondary | ICD-10-CM | POA: Diagnosis not present

## 2024-04-13 DIAGNOSIS — D72829 Elevated white blood cell count, unspecified: Secondary | ICD-10-CM | POA: Diagnosis not present

## 2024-05-03 ENCOUNTER — Encounter: Payer: Self-pay | Admitting: Internal Medicine

## 2024-05-08 ENCOUNTER — Encounter: Payer: Self-pay | Admitting: Urology

## 2024-05-08 ENCOUNTER — Ambulatory Visit (INDEPENDENT_AMBULATORY_CARE_PROVIDER_SITE_OTHER): Admitting: Urology

## 2024-05-08 VITALS — BP 116/79 | HR 89

## 2024-05-08 DIAGNOSIS — R972 Elevated prostate specific antigen [PSA]: Secondary | ICD-10-CM

## 2024-05-08 LAB — URINALYSIS, ROUTINE W REFLEX MICROSCOPIC
Bilirubin, UA: NEGATIVE
Glucose, UA: NEGATIVE
Ketones, UA: NEGATIVE
Leukocytes,UA: NEGATIVE
Nitrite, UA: NEGATIVE
Protein,UA: NEGATIVE
RBC, UA: NEGATIVE
Specific Gravity, UA: 1.025 (ref 1.005–1.030)
Urobilinogen, Ur: 0.2 mg/dL (ref 0.2–1.0)
pH, UA: 6 (ref 5.0–7.5)

## 2024-05-08 NOTE — Progress Notes (Signed)
 05/08/2024 1:49 PM   Daniel Duffy Jul 02, 1951 [id number]  Referring provider: [facility] [address] [place],  [place] [address]  No chief complaint on file.   HPI:  F/u -    1) PSA elevation - a 10/18 PSA was 4.4 and recheck 3.03. His 06/20 PSA was 3.9 but 04/22 PSA 6.9, hematocrit 45, testosterone  1364 (he has a history of low testosterone . He doesn't recall or list T replacement). Prostate biopsy 06/22 benign with 53 gram prostate and a PSA of 5.7. His 12/22 PSA was 6.0 (psad 0.11), so we went ahead and set up a Jan 2023 prostate MRI which was benign (PIRADS 2) with a 47 g prostate. His PSA has increased Apr 2023 to 8.7. He thinks he had sex prior. His brother had PCa and he recalls what sounds like XRT. PSA recheck May 2023 was 5.5 with a normal DRE Apr 2023. His Oct 2023 PSA stable at 5.2. CT a/p Aug 2023 after fall revealed an 80 g prostate and no LAD or bone lesions.  Bx: Jun 2022 - benign, prostate 53 g    Staging: Jan 2023 pMRI - benign, prostate 53 g  Aug 2023 CT a/p - benign, prostate 80 g   2)  BPH on tamsulosin. Prostate 50-80 g on imaging. PVR was 11.  Neurogenic risk includes spinal cord surgery.  He has some urgency and nocturia.  AUASS = 4-6. He takes tamsulosin.    3) ED - His prior SHIM was 10 and he tried sildenafil. He saw Dr. Dulcy Gibney in [facility] 2018 - 2019. He loses an erection too soon.      Today, seen for the above. Jan 2025 Cr 1.12. Apr 2025 PSA 6.7. No voiding complaints.   IPSS 14. UA normal.    PMH: Past Medical History:  Diagnosis Date   Abnormal weight loss    Acid reflux    Acute prostatitis    Acute sinusitis, unspecified    Chronic idiopathic constipation    Colon polyps    Enlarged prostate    Hyperlipidemia, unspecified    Incisional hernia without obstruction or gangrene    Lumbago    Nonspecific elevation of levels of transaminase and lactic acid dehydrogenase (LDH)    Other male erectile dysfunction     Other obstructive and reflux uropathy    Radiculopathy, lumbar region    Rash and other nonspecific skin eruption    Reflux esophagitis     Surgical History: Past Surgical History:  Procedure Laterality Date   BIOPSY  04/05/2023   Procedure: BIOPSY;  Surgeon: Suzette Espy, MD;  [facility];  Service: Endoscopy;;   COLONOSCOPY  12/29/2005   [id number] hemorrhoids, anal papilla/Diminutive polyp on a stalk at 10 cm/otherwise normal rectum and colon, PATH: hyperplastic polyp   COLONOSCOPY N/A 04/10/2013   RMR: tubular adenoma: next TCS 04/2018   COLONOSCOPY WITH PROPOFOL  N/A 02/10/2018   Surgeon: Suzette Espy, MD;  pancolonic diverticulosis, otherwise normal exam.  Recommended repeat colonoscopy in 5 years for surveillance due to history of colon polyps.   COLONOSCOPY WITH PROPOFOL  N/A 04/05/2023   Procedure: COLONOSCOPY WITH PROPOFOL ;  Surgeon: Suzette Espy, MD;  [facility];  Service: Endoscopy;  Laterality: N/A;  11:15 am   ESOPHAGOGASTRODUODENOSCOPY  12/29/2005   [id number] Schatzki's ring, small hiatal hernia, normal gastric mucosa, normal D1 and D2.    ESOPHAGOGASTRODUODENOSCOPY (EGD) WITH PROPOFOL  N/A 04/05/2023   Procedure: ESOPHAGOGASTRODUODENOSCOPY (EGD) WITH PROPOFOL ;  Surgeon: Suzette Espy, MD;  [facility];  Service: Endoscopy;  Laterality: N/A;   HAND SURGERY     bilateral, got caught in a machine, two different occasions, right hand originally, than left hand. Left hand with skin graft   SPINAL CORD DECOMPRESSION      Home Medications:  Allergies as of 05/08/2024       Reactions   Nitrates, Organic Other (See Comments)   Could not control of body        Medication List        Accurate as of May 08, 2024  1:49 PM. If you have any questions, ask your nurse or doctor.          ammonium lactate 12 % cream Commonly known as: AMLACTIN Apply topically 2 (two) times daily.   atorvastatin 40 MG tablet Commonly  known as: LIPITOR Take 40 mg by mouth at bedtime.   brimonidine 0.2 % ophthalmic solution Commonly known as: ALPHAGAN Place 1 drop into both eyes 2 (two) times daily.   clindamycin 300 MG capsule Commonly known as: CLEOCIN Take 300 mg by mouth every 6 (six) hours.   latanoprost 0.005 % ophthalmic solution Commonly known as: XALATAN Place 1 drop into both eyes at bedtime.   multivitamins ther. w/minerals Tabs tablet Take 1 tablet by mouth daily.   mupirocin ointment 2 % Commonly known as: BACTROBAN Apply 1 Application topically 3 (three) times daily.   neomycin-polymyxin b-dexamethasone  3.5-10000-0.1 Susp Commonly known as: MAXITROL Place 2 drops into both eyes 3 (three) times daily.   omeprazole  40 MG capsule Commonly known as: PRILOSEC TAKE 1 CAPSULE(40 MG) BY MOUTH DAILY BEFORE BREAKFAST   polyethylene glycol 17 g packet Commonly known as: MiraLax  Take 17 g by mouth daily.   tamsulosin 0.4 MG Caps capsule Commonly known as: FLOMAX Take 0.4 mg by mouth in the morning.        Allergies:  Allergies  Allergen Reactions   Nitrates, Organic Other (See Comments)    Could not control of body    Family History: Family History  Problem Relation Age of Onset   CAD Brother    Colon polyps Brother     Social History:  reports that he has quit smoking. He has never used smokeless tobacco. He reports that he does not drink alcohol and does not use drugs.   Physical Exam: BP 116/79   Pulse 89   Constitutional:  Alert and oriented, No acute distress. HEENT: [place] AT, moist mucus membranes.  Trachea midline, no masses. Cardiovascular: No clubbing, cyanosis, or edema. Respiratory: Normal respiratory effort, no increased work of breathing. GI: Abdomen is soft, nontender, nondistended, no abdominal masses GU: No CVA tenderness Lymph: No cervical or inguinal lymphadenopathy. Skin: No rashes, bruises or suspicious lesions. Neurologic: Grossly intact, no focal deficits,  moving all 4 extremities. Psychiatric: Normal mood and affect.  Laboratory Data: Lab Results  Component Value Date   WBC 4.9 12/18/2023   HGB 13.4 12/18/2023   HCT 40.6 12/18/2023   MCV 85.8 12/18/2023   PLT 218 12/18/2023    Lab Results  Component Value Date   CREATININE 1.12 12/18/2023    Lab Results  Component Value Date   PSA 3.9 05/23/2019    No results found for: "TESTOSTERONE "  Lab Results  Component Value Date   HGBA1C 6.2 07/12/2015    Urinalysis    Component Value Date/Time   COLORURINE YELLOW 07/30/2022 1016   APPEARANCEUR Clear 09/21/2022 0935  LABSPEC 1.011 07/30/2022 1016   PHURINE 6.0 07/30/2022 1016   GLUCOSEU Negative 09/21/2022 0935   HGBUR SMALL (A) 07/30/2022 1016   BILIRUBINUR Negative 09/21/2022 0935   KETONESUR NEGATIVE 07/30/2022 1016   PROTEINUR Negative 09/21/2022 0935   PROTEINUR NEGATIVE 07/30/2022 1016   UROBILINOGEN 0.2 01/29/2014 2300   NITRITE Negative 09/21/2022 0935   NITRITE NEGATIVE 07/30/2022 1016   LEUKOCYTESUR Negative 09/21/2022 0935   LEUKOCYTESUR MODERATE (A) 07/30/2022 1016    Lab Results  Component Value Date   LABMICR See below: 09/21/2022   WBCUA 0-5 09/21/2022   LABEPIT 0-10 09/21/2022   MUCUS Present 04/21/2021   BACTERIA None seen 09/21/2022    Pertinent Imaging: N/a   Assessment & Plan:    1. Elevated PSA (Primary) Overall PSA remains stable with a normal PSAD and close to age specific level of 6.5. Continue to monitor. Recheck in 6 months.   - Urinalysis, Routine w reflex microscopic   No follow-ups on file.  Christina Coyer, MD  [facility]  [address] [place], [place] [address] [phone] \

## 2024-05-18 DIAGNOSIS — Z6821 Body mass index (BMI) 21.0-21.9, adult: Secondary | ICD-10-CM | POA: Diagnosis not present

## 2024-05-18 DIAGNOSIS — M5030 Other cervical disc degeneration, unspecified cervical region: Secondary | ICD-10-CM | POA: Diagnosis not present

## 2024-05-18 DIAGNOSIS — S80861A Insect bite (nonvenomous), right lower leg, initial encounter: Secondary | ICD-10-CM | POA: Diagnosis not present

## 2024-05-18 DIAGNOSIS — M5416 Radiculopathy, lumbar region: Secondary | ICD-10-CM | POA: Diagnosis not present

## 2024-05-23 ENCOUNTER — Encounter: Payer: Self-pay | Admitting: Gastroenterology

## 2024-05-23 ENCOUNTER — Ambulatory Visit (INDEPENDENT_AMBULATORY_CARE_PROVIDER_SITE_OTHER): Admitting: Gastroenterology

## 2024-05-23 VITALS — BP 121/74 | HR 76 | Temp 98.1°F | Ht 71.0 in | Wt 156.6 lb

## 2024-05-23 DIAGNOSIS — M79672 Pain in left foot: Secondary | ICD-10-CM | POA: Diagnosis not present

## 2024-05-23 DIAGNOSIS — K5900 Constipation, unspecified: Secondary | ICD-10-CM

## 2024-05-23 DIAGNOSIS — M79671 Pain in right foot: Secondary | ICD-10-CM | POA: Diagnosis not present

## 2024-05-23 DIAGNOSIS — K5904 Chronic idiopathic constipation: Secondary | ICD-10-CM

## 2024-05-23 DIAGNOSIS — M79675 Pain in left toe(s): Secondary | ICD-10-CM | POA: Diagnosis not present

## 2024-05-23 DIAGNOSIS — K219 Gastro-esophageal reflux disease without esophagitis: Secondary | ICD-10-CM | POA: Insufficient documentation

## 2024-05-23 DIAGNOSIS — M79674 Pain in right toe(s): Secondary | ICD-10-CM | POA: Diagnosis not present

## 2024-05-23 DIAGNOSIS — L110 Acquired keratosis follicularis: Secondary | ICD-10-CM | POA: Diagnosis not present

## 2024-05-23 DIAGNOSIS — I739 Peripheral vascular disease, unspecified: Secondary | ICD-10-CM | POA: Diagnosis not present

## 2024-05-23 NOTE — Progress Notes (Signed)
 GI Office Note    Referring Provider: [facility] Primary Care Physician:  [facility]  Primary Gastroenterologist: Rheba Cedar, MD   Chief Complaint   Chief Complaint  Patient presents with   Follow-up    History of Present Illness   Daniel Duffy is a 73 y.o. male presenting today for follow up. Last seen 11/2023. H/o GERD, constipation, history of colon polyps.   Doing well. Tolerated reducing omeprazole  to 40mg  daily. No heartburn. No abdominal pain. He has woken up few times at night with stomach growling but no associated hunger, abd pain, gas, or BMs. No weight loss. Appetite is great. No dysphagia. BMs regular. No melena, brbpr. Sometimes takes a second dose of omeprazole  or Pepto for stomach growling at night. Seems to help.     04/2024: WBC 2.7, Hgb 13.5, Plt 184. 03/2024: Tbili 0.4, AP 83, AST 37, ALT 34, TSH 0.75   Wt Readings from Last 3 Encounters:  05/23/24 156 lb 9.6 oz (71 kg)  12/18/23 150 lb (68 kg)  11/19/23 156 lb 6.4 oz (70.9 kg)    EGD 03/2023: -normal esophagus -small hh. Minimally polypoid mucosa of doubtful clinical significant s/p bx, neg for h.pylori  Colonoscopy 03/2023: -diverticulosis, redundant colon -repeat colonoscopy in 7 years  Medications   Current Outpatient Medications  Medication Sig Dispense Refill   atorvastatin (LIPITOR) 40 MG tablet Take 40 mg by mouth at bedtime.     brimonidine (ALPHAGAN) 0.2 % ophthalmic solution Place 1 drop into both eyes 2 (two) times daily.     cephALEXin (KEFLEX) 500 MG capsule Take 500 mg by mouth 4 (four) times daily.     ipratropium (ATROVENT) 0.03 % nasal spray Place 2 sprays into both nostrils 3 (three) times daily.     latanoprost (XALATAN) 0.005 % ophthalmic solution Place 1 drop into both eyes at bedtime.     levocetirizine (XYZAL ) 5 MG tablet Take 5 mg by mouth daily.     Multiple Vitamins-Minerals (MULTIVITAMINS THER. W/MINERALS) TABS Take 1 tablet  by mouth daily.     omeprazole  (PRILOSEC) 40 MG capsule TAKE 1 CAPSULE(40 MG) BY MOUTH DAILY BEFORE BREAKFAST 90 capsule 1   polyethylene glycol (MIRALAX ) 17 g packet Take 17 g by mouth daily. 14 each 0   tamsulosin (FLOMAX) 0.4 MG CAPS capsule Take 0.4 mg by mouth in the morning.     No current facility-administered medications for this visit.    Allergies   Allergies as of 05/23/2024 - Review Complete 05/23/2024  Allergen Reaction Noted   Nitrates, organic Other (See Comments) 02/12/2022       Review of Systems   General: Negative for anorexia, weight loss, fever, chills, fatigue, weakness. ENT: Negative for hoarseness, difficulty swallowing , nasal congestion. CV: Negative for chest pain, angina, palpitations, dyspnea on exertion, peripheral edema.  Respiratory: Negative for dyspnea at rest, dyspnea on exertion, cough, sputum, wheezing.  GI: See history of present illness. GU:  Negative for dysuria, hematuria, urinary incontinence, urinary frequency, nocturnal urination.  Endo: Negative for unusual weight change.     Physical Exam   BP 121/74 (BP Location: Right Arm, Patient Position: Sitting, Cuff Size: Normal)   Pulse 76   Temp 98.1 F (36.7 C) (Oral)   Ht 5' 11 (1.803 m)   Wt 156 lb 9.6 oz (71 kg)   SpO2 95%   BMI 21.84 kg/m    General: Well-nourished, well-developed in no acute distress.  Eyes: No  icterus. Mouth: Oropharyngeal mucosa moist and pink   Abdomen: Bowel sounds are normal, nontender, nondistended, no hepatosplenomegaly or masses,  no abdominal bruits or hernia , no rebound or guarding.  Rectal: not performed Extremities: No lower extremity edema. No clubbing or deformities. Neuro: Alert and oriented x 4   Skin: Warm and dry, no jaundice.   Psych: Alert and cooperative, normal mood and affect.  Labs   Lab Results  Component Value Date   ALT 23 12/18/2023   AST 38 12/18/2023   ALKPHOS 69 12/18/2023   BILITOT 0.6 12/18/2023   Lab Results   Component Value Date   WBC 4.9 12/18/2023   HGB 13.4 12/18/2023   HCT 40.6 12/18/2023   MCV 85.8 12/18/2023   PLT 218 12/18/2023   Lab Results  Component Value Date   LIPASE 27 12/18/2023   Lab Results  Component Value Date   NA 134 (L) 12/18/2023   CL 104 12/18/2023   K 4.0 12/18/2023   CO2 24 12/18/2023   BUN 14 12/18/2023   CREATININE 1.12 12/18/2023   GFRNONAA >60 12/18/2023   CALCIUM 10.4 (H) 12/18/2023   ALBUMIN 3.6 12/18/2023   GLUCOSE 105 (H) 12/18/2023   See hpi  Imaging Studies   No results found.  Assessment/Plan:   GERD: doing well -continue omeprazole  40mg  daily before breakfast -nocturnal stomach growling insignificant, monitor for alarm symptoms  Constipation: -doing well on miralax   Return ov in six months     Trudie Fuse. Harles Lied, MHS, PA-C [facility]

## 2024-05-23 NOTE — Patient Instructions (Signed)
 Continue omeprazole  40mg  daily before breakfast for acid reflux. Continue miralax  one packet daily as needed for constipation.  Let me know if you have abdominal pain, vomiting, weight loss, blood in the stool. Return office visit in six months.

## 2024-07-06 ENCOUNTER — Emergency Department (HOSPITAL_COMMUNITY)

## 2024-07-06 ENCOUNTER — Other Ambulatory Visit: Payer: Self-pay

## 2024-07-06 ENCOUNTER — Inpatient Hospital Stay (HOSPITAL_COMMUNITY)
Admission: EM | Admit: 2024-07-06 | Discharge: 2024-07-09 | DRG: 872 | Disposition: A | Discharge status: Home / Self Care | Source: Ambulatory Visit | Attending: Internal Medicine | Admitting: Internal Medicine

## 2024-07-06 ENCOUNTER — Encounter (HOSPITAL_COMMUNITY): Payer: Self-pay

## 2024-07-06 DIAGNOSIS — N390 Urinary tract infection, site not specified: Principal | ICD-10-CM | POA: Diagnosis present

## 2024-07-06 DIAGNOSIS — Z79899 Other long term (current) drug therapy: Secondary | ICD-10-CM

## 2024-07-06 DIAGNOSIS — Z1612 Extended spectrum beta lactamase (ESBL) resistance: Secondary | ICD-10-CM | POA: Diagnosis present

## 2024-07-06 DIAGNOSIS — Z87891 Personal history of nicotine dependence: Secondary | ICD-10-CM | POA: Diagnosis not present

## 2024-07-06 DIAGNOSIS — Z888 Allergy status to other drugs, medicaments and biological substances status: Secondary | ICD-10-CM | POA: Diagnosis not present

## 2024-07-06 DIAGNOSIS — Z8249 Family history of ischemic heart disease and other diseases of the circulatory system: Secondary | ICD-10-CM | POA: Diagnosis not present

## 2024-07-06 DIAGNOSIS — E785 Hyperlipidemia, unspecified: Secondary | ICD-10-CM | POA: Diagnosis not present

## 2024-07-06 DIAGNOSIS — A4151 Sepsis due to Escherichia coli [E. coli]: Principal | ICD-10-CM | POA: Diagnosis present

## 2024-07-06 DIAGNOSIS — K2100 Gastro-esophageal reflux disease with esophagitis, without bleeding: Secondary | ICD-10-CM

## 2024-07-06 DIAGNOSIS — Z83719 Family history of colon polyps, unspecified: Secondary | ICD-10-CM

## 2024-07-06 DIAGNOSIS — R1013 Epigastric pain: Secondary | ICD-10-CM | POA: Diagnosis not present

## 2024-07-06 DIAGNOSIS — R1010 Upper abdominal pain, unspecified: Secondary | ICD-10-CM | POA: Diagnosis not present

## 2024-07-06 DIAGNOSIS — Z860100 Personal history of colon polyps, unspecified: Secondary | ICD-10-CM | POA: Diagnosis not present

## 2024-07-06 DIAGNOSIS — K219 Gastro-esophageal reflux disease without esophagitis: Secondary | ICD-10-CM | POA: Diagnosis present

## 2024-07-06 DIAGNOSIS — D649 Anemia, unspecified: Secondary | ICD-10-CM | POA: Diagnosis present

## 2024-07-06 DIAGNOSIS — N400 Enlarged prostate without lower urinary tract symptoms: Secondary | ICD-10-CM | POA: Diagnosis present

## 2024-07-06 DIAGNOSIS — N3001 Acute cystitis with hematuria: Secondary | ICD-10-CM | POA: Diagnosis not present

## 2024-07-06 DIAGNOSIS — R109 Unspecified abdominal pain: Secondary | ICD-10-CM | POA: Diagnosis not present

## 2024-07-06 DIAGNOSIS — K7689 Other specified diseases of liver: Secondary | ICD-10-CM | POA: Diagnosis not present

## 2024-07-06 DIAGNOSIS — R1031 Right lower quadrant pain: Secondary | ICD-10-CM | POA: Diagnosis not present

## 2024-07-06 DIAGNOSIS — A419 Sepsis, unspecified organism: Secondary | ICD-10-CM | POA: Diagnosis present

## 2024-07-06 DIAGNOSIS — N402 Nodular prostate without lower urinary tract symptoms: Secondary | ICD-10-CM

## 2024-07-06 DIAGNOSIS — N401 Enlarged prostate with lower urinary tract symptoms: Secondary | ICD-10-CM | POA: Diagnosis not present

## 2024-07-06 DIAGNOSIS — Z6820 Body mass index (BMI) 20.0-20.9, adult: Secondary | ICD-10-CM | POA: Diagnosis not present

## 2024-07-06 LAB — COMPREHENSIVE METABOLIC PANEL WITH GFR
ALT: 35 U/L (ref 0–44)
AST: 44 U/L — ABNORMAL HIGH (ref 15–41)
Albumin: 3.4 g/dL — ABNORMAL LOW (ref 3.5–5.0)
Alkaline Phosphatase: 68 U/L (ref 38–126)
Anion gap: 10 (ref 5–15)
BUN: 11 mg/dL (ref 8–23)
CO2: 24 mmol/L (ref 22–32)
Calcium: 10.6 mg/dL — ABNORMAL HIGH (ref 8.9–10.3)
Chloride: 103 mmol/L (ref 98–111)
Creatinine, Ser: 1.07 mg/dL (ref 0.61–1.24)
GFR, Estimated: >60 mL/min (ref >60)
Glucose, Bld: 122 mg/dL — ABNORMAL HIGH (ref 70–99)
Potassium: 4.3 mmol/L (ref 3.5–5.1)
Sodium: 137 mmol/L (ref 135–145)
Total Bilirubin: 0.9 mg/dL (ref 0.0–1.2)
Total Protein: 7.5 g/dL (ref 6.5–8.1)

## 2024-07-06 LAB — CBC WITH DIFFERENTIAL/PLATELET
Abs Immature Granulocytes: 0.01 K/uL (ref 0.00–0.07)
Basophils Absolute: 0 K/uL (ref 0.0–0.1)
Basophils Relative: 0 %
Eosinophils Absolute: 0.1 K/uL (ref 0.0–0.5)
Eosinophils Relative: 1 %
HCT: 39.2 % (ref 39.0–52.0)
Hemoglobin: 12.8 g/dL — ABNORMAL LOW (ref 13.0–17.0)
Immature Granulocytes: 0 %
Lymphocytes Relative: 14 %
Lymphs Abs: 1 K/uL (ref 0.7–4.0)
MCH: 28.3 pg (ref 26.0–34.0)
MCHC: 32.7 g/dL (ref 30.0–36.0)
MCV: 86.5 fL (ref 80.0–100.0)
Monocytes Absolute: 0.8 K/uL (ref 0.1–1.0)
Monocytes Relative: 11 %
Neutro Abs: 5.3 K/uL (ref 1.7–7.7)
Neutrophils Relative %: 74 %
Platelets: 175 K/uL (ref 150–400)
RBC: 4.53 MIL/uL (ref 4.22–5.81)
RDW: 14.4 % (ref 11.5–15.5)
WBC: 7.2 K/uL (ref 4.0–10.5)
nRBC: 0 % (ref 0.0–0.2)

## 2024-07-06 LAB — URINALYSIS, ROUTINE W REFLEX MICROSCOPIC
Bilirubin Urine: NEGATIVE
Glucose, UA: NEGATIVE mg/dL
Ketones, ur: NEGATIVE mg/dL
Nitrite: NEGATIVE
Protein, ur: 30 mg/dL — AB
Specific Gravity, Urine: 1.006 (ref 1.005–1.030)
WBC, UA: >50 WBC/hpf (ref 0–5)
pH: 6 (ref 5.0–8.0)

## 2024-07-06 LAB — TROPONIN I (HIGH SENSITIVITY)
Troponin I (High Sensitivity): 4 ng/L (ref <18)
Troponin I (High Sensitivity): 4 ng/L (ref <18)

## 2024-07-06 LAB — LIPASE, BLOOD: Lipase: 26 U/L (ref 11–51)

## 2024-07-06 MED ORDER — MORPHINE SULFATE (PF) 4 MG/ML IV SOLN
4.0000 mg | Freq: Once | INTRAVENOUS | Status: AC
  Administered 2024-07-06: 4 mg via INTRAVENOUS
  Filled 2024-07-06: qty 1

## 2024-07-06 MED ORDER — ATORVASTATIN CALCIUM 40 MG PO TABS
40.0000 mg | ORAL_TABLET | Freq: Every day | ORAL | Status: DC
  Administered 2024-07-06 – 2024-07-08 (×3): 40 mg via ORAL
  Filled 2024-07-06 (×3): qty 1

## 2024-07-06 MED ORDER — ONDANSETRON HCL 4 MG/2ML IJ SOLN: 4.0000 mg | Freq: Four times a day (QID) | INTRAMUSCULAR | Status: DC | PRN

## 2024-07-06 MED ORDER — ACETAMINOPHEN 650 MG RE SUPP: 650.0000 mg | Freq: Four times a day (QID) | RECTAL | Status: DC | PRN

## 2024-07-06 MED ORDER — CEPHALEXIN 500 MG PO CAPS: 500.0000 mg | ORAL_CAPSULE | Freq: Two times a day (BID) | ORAL | 0 refills | Status: DC

## 2024-07-06 MED ORDER — CETIRIZINE HCL 10 MG PO TABS
10.0000 mg | ORAL_TABLET | Freq: Every day | ORAL | Status: DC
  Filled 2024-07-06 (×3): qty 1

## 2024-07-06 MED ORDER — ADULT MULTIVITAMIN W/MINERALS CH
1.0000 | ORAL_TABLET | Freq: Every day | ORAL | Status: DC
  Administered 2024-07-06 – 2024-07-09 (×4): 1 via ORAL
  Filled 2024-07-06 (×7): qty 1

## 2024-07-06 MED ORDER — LATANOPROST 0.005 % OP SOLN
1.0000 [drp] | Freq: Every day | OPHTHALMIC | Status: DC
  Administered 2024-07-06 – 2024-07-08 (×3): 1 [drp] via OPHTHALMIC
  Filled 2024-07-06: qty 2.5

## 2024-07-06 MED ORDER — ONDANSETRON HCL 4 MG PO TABS: 4.0000 mg | ORAL_TABLET | Freq: Four times a day (QID) | ORAL | Status: DC | PRN

## 2024-07-06 MED ORDER — ACETAMINOPHEN 325 MG PO TABS
650.0000 mg | ORAL_TABLET | Freq: Once | ORAL | Status: AC
  Administered 2024-07-06: 650 mg via ORAL
  Filled 2024-07-06: qty 2

## 2024-07-06 MED ORDER — TAMSULOSIN HCL 0.4 MG PO CAPS
0.4000 mg | ORAL_CAPSULE | Freq: Every day | ORAL | Status: DC
  Administered 2024-07-07 – 2024-07-09 (×3): 0.4 mg via ORAL
  Filled 2024-07-06 (×3): qty 1

## 2024-07-06 MED ORDER — ACETAMINOPHEN 325 MG PO TABS
650.0000 mg | ORAL_TABLET | Freq: Four times a day (QID) | ORAL | Status: DC | PRN
Start: 2024-07-06 — End: 2024-07-09
  Administered 2024-07-07: 650 mg via ORAL
  Filled 2024-07-06: qty 2

## 2024-07-06 MED ORDER — SODIUM CHLORIDE 0.9 % IV SOLN
1.0000 g | INTRAVENOUS | Status: DC
  Administered 2024-07-07 – 2024-07-08 (×2): 1 g via INTRAVENOUS
  Filled 2024-07-06 (×2): qty 10

## 2024-07-06 MED ORDER — POLYETHYLENE GLYCOL 3350 17 G PO PACK
17.0000 g | PACK | Freq: Every day | ORAL | Status: DC
  Administered 2024-07-06 – 2024-07-09 (×3): 17 g via ORAL
  Filled 2024-07-06 (×4): qty 1

## 2024-07-06 MED ORDER — BRIMONIDINE TARTRATE 0.2 % OP SOLN
1.0000 [drp] | Freq: Two times a day (BID) | OPHTHALMIC | Status: DC
  Administered 2024-07-06 – 2024-07-09 (×6): 1 [drp] via OPHTHALMIC
  Filled 2024-07-06 (×2): qty 5

## 2024-07-06 MED ORDER — ENOXAPARIN SODIUM 40 MG/0.4ML IJ SOSY
40.0000 mg | PREFILLED_SYRINGE | INTRAMUSCULAR | Status: DC
  Administered 2024-07-06 – 2024-07-08 (×3): 40 mg via SUBCUTANEOUS
  Filled 2024-07-06 (×3): qty 0.4

## 2024-07-06 MED ORDER — CEPHALEXIN 500 MG PO CAPS
500.0000 mg | ORAL_CAPSULE | Freq: Once | ORAL | Status: DC
  Filled 2024-07-06: qty 1

## 2024-07-06 MED ORDER — PANTOPRAZOLE SODIUM 40 MG PO TBEC
40.0000 mg | DELAYED_RELEASE_TABLET | Freq: Every day | ORAL | Status: DC
  Administered 2024-07-06 – 2024-07-09 (×4): 40 mg via ORAL
  Filled 2024-07-06 (×4): qty 1

## 2024-07-06 MED ORDER — SODIUM CHLORIDE 0.9 % IV SOLN
1.0000 g | Freq: Once | INTRAVENOUS | Status: AC
  Administered 2024-07-06: 1 g via INTRAVENOUS
  Filled 2024-07-06: qty 10

## 2024-07-06 MED ORDER — SODIUM CHLORIDE 0.9 % IV BOLUS
1000.0000 mL | Freq: Once | INTRAVENOUS | Status: AC
  Administered 2024-07-06: 1000 mL via INTRAVENOUS

## 2024-07-06 MED ORDER — SODIUM CHLORIDE 0.9 % IV SOLN: INTRAVENOUS | Status: AC

## 2024-07-06 MED ORDER — IPRATROPIUM BROMIDE 0.03 % NA SOLN
2.0000 | Freq: Three times a day (TID) | NASAL | Status: DC
  Filled 2024-07-06: qty 30

## 2024-07-06 MED ORDER — IOHEXOL 300 MG/ML  SOLN
100.0000 mL | Freq: Once | INTRAMUSCULAR | Status: AC | PRN
  Administered 2024-07-06: 100 mL via INTRAVENOUS

## 2024-07-06 NOTE — H&P (Signed)
 History and Physical    Patient: Daniel Duffy FMW:[id number] DOB: 1951/10/01 DOA: 07/06/2024 DOS: the patient was seen and examined on 07/06/2024 PCP: [facility]  Patient coming from: Home  Chief Complaint:  Chief Complaint  Patient presents with   Abdominal Pain   HPI: Daniel Duffy is a 73 y.o. male with medical history significant of GERD, hyperlipidemia, and BPH who presented with abdominal pain. He reports dysuria and difficulty passing urine for the last 2 to 3 days.  Today he had abdominal pain, moderate in intensity, mid abdomen in location with no radiation or associated fever or chills. He went to his primary care and he was referred to the ED for further evaluation.    At the time of my examination his pain has improved but not completely resolved. He was treated with IV antibiotic therapy in the ED and plan was to discharge home. He spike a fever prior to discharge and decision was made to observe overnight.   Review of Systems: As mentioned in the history of present illness. All other systems reviewed and are negative. Past Medical History:  Diagnosis Date   Abnormal weight loss    Acid reflux    Acute prostatitis    Acute sinusitis, unspecified    Chronic idiopathic constipation    Colon polyps    Enlarged prostate    Hyperlipidemia, unspecified    Incisional hernia without obstruction or gangrene    Lumbago    Nonspecific elevation of levels of transaminase and lactic acid dehydrogenase (LDH)    Other male erectile dysfunction    Other obstructive and reflux uropathy    Radiculopathy, lumbar region    Rash and other nonspecific skin eruption    Reflux esophagitis    Past Surgical History:  Procedure Laterality Date   BIOPSY  04/05/2023   Procedure: BIOPSY;  Surgeon: Shaaron Lamar HERO, MD;  Location: [facility];  Service: Endoscopy;;   COLONOSCOPY  12/29/2005   MFM:Pwuzmwjo hemorrhoids, anal papilla/Diminutive polyp on a stalk at 10  cm/otherwise normal rectum and colon, PATH: hyperplastic polyp   COLONOSCOPY N/A 04/10/2013   RMR: tubular adenoma: next TCS 04/2018   COLONOSCOPY WITH PROPOFOL  N/A 02/10/2018   Surgeon: Shaaron Lamar HERO, MD;  pancolonic diverticulosis, otherwise normal exam.  Recommended repeat colonoscopy in 5 years for surveillance due to history of colon polyps.   COLONOSCOPY WITH PROPOFOL  N/A 04/05/2023   Procedure: COLONOSCOPY WITH PROPOFOL ;  Surgeon: Shaaron Lamar HERO, MD;  Location: [facility];  Service: Endoscopy;  Laterality: N/A;  11:15 am   ESOPHAGOGASTRODUODENOSCOPY  12/29/2005   MFM:wnwrmpuprjo Schatzki's ring, small hiatal hernia, normal gastric mucosa, normal D1 and D2.    ESOPHAGOGASTRODUODENOSCOPY (EGD) WITH PROPOFOL  N/A 04/05/2023   Procedure: ESOPHAGOGASTRODUODENOSCOPY (EGD) WITH PROPOFOL ;  Surgeon: Shaaron Lamar HERO, MD;  Location: [facility];  Service: Endoscopy;  Laterality: N/A;   HAND SURGERY     bilateral, got caught in a machine, two different occasions, right hand originally, than left hand. Left hand with skin graft   SPINAL CORD DECOMPRESSION     Social History:  reports that he has quit smoking. He has been exposed to tobacco smoke. He has never used smokeless tobacco. He reports that he does not drink alcohol and does not use drugs.  Allergies  Allergen Reactions   Nitrates, Organic Other (See Comments)    Could not control of body    Family History  Problem Relation Age of Onset   CAD Brother  Colon polyps Brother     Prior to Admission medications   Medication Sig Start Date End Date Taking? Authorizing Provider  atorvastatin  (LIPITOR) 40 MG tablet Take 40 mg by mouth at bedtime. 04/27/23 05/23/24  [provider]  brimonidine  (ALPHAGAN ) 0.2 % ophthalmic solution Place 1 drop into both eyes 2 (two) times daily. 12/14/23   [provider]  cephALEXin  (KEFLEX ) 500 MG capsule Take 1 capsule (500 mg total) by mouth 2 (two) times daily for 7 days. 07/06/24  07/13/24  Freddi Hamilton, MD  ipratropium (ATROVENT ) 0.03 % nasal spray Place 2 sprays into both nostrils 3 (three) times daily. 04/17/24   [provider]  latanoprost  (XALATAN ) 0.005 % ophthalmic solution Place 1 drop into both eyes at bedtime. 12/15/23   [provider]  levocetirizine (XYZAL ) 5 MG tablet Take 5 mg by mouth daily. 05/11/24   [provider]  Multiple Vitamins-Minerals (MULTIVITAMINS THER. W/MINERALS) TABS Take 1 tablet by mouth daily.    [provider]  omeprazole  (PRILOSEC) 40 MG capsule TAKE 1 CAPSULE(40 MG) BY MOUTH DAILY BEFORE BREAKFAST 02/02/24   Rourk, Lamar HERO, MD  polyethylene glycol (MIRALAX ) 17 g packet Take 17 g by mouth daily. 07/30/22   Lingelbach, John K, PA-C  tamsulosin  (FLOMAX ) 0.4 MG CAPS capsule Take 0.4 mg by mouth in the morning.    [provider]    Physical Exam: Vitals:   07/06/24 1630 07/06/24 1654 07/06/24 1700 07/06/24 1728  BP:   131/78   Pulse:      Resp: (!) 21 20 (!) 21 14  Temp:    (!) 103 F (39.4 C)  TempSrc:    Oral  SpO2:      Weight:      Height:       BP 108/75   Pulse 97   Temp 98.6 F (37 C) (Oral)   Resp 20   Ht 5' 11 (1.803 m)   Wt 65.6 kg   SpO2 96%   BMI 20.17 kg/m  RR 22   Neurology awake and alert ENT with no pallor or icterus, oral mucosa moist Cardiovascular with S1 and S2 present and regular with no gallops, rubs or murmurs Respiratory with no rales or wheezing, no rhonchi  Abdomen with no distention, soft and non tender No lower extremity edema   Data Reviewed:   Na 137, K 4.3 Cl 103 bicarbonate 24 glucose 122 bun 11 cr 1,0  AST 44 ALT 35  High sensitive troponin 4 and 4  Wbc 7,2 hgb 12.8 plt 175 Urine analysis SG 1,006, protein 30, large leukocytes, large Hgb, > 50 wbc, 6-10 rbc   Chest radiograph with no cardiomegaly, no infiltrates or effusions, mild elevation of right hemidiaphragm.   CT abdomen and pelvis with bladder mildly distended with mid  circumferential bladder thickening, which may be secondary to chronic outlet obstruction in the setting of prostatomegaly or cystitis.  US  right upper quadrant unremarkable.   EKG 80 bpm, normal axis, normal intervals, qtc 405, sinus rhythm with no significant ST segment or  T wave changes.   Assessment and Plan: * UTI (urinary tract infection) Sepsis present on admission.   Plan to continue antibiotic therapy with IV ceftriaxone  Follow up on cell count, cultures and temperature curve IV fluids with isotonic saline at 75 ml per Hr   BPH (benign prostatic hyperplasia) Bladder scan in the ED with no significant retention  Continue with tamsulosin   Bladder scan as needed.   Hyperlipidemia,  unspecified Continue with atorvastatin    GERD (gastroesophageal reflux disease) Continue proton pump inhibitor with pantoprazole .     Advance Care Planning:   Code Status: Full Code   Consults: none   Family Communication: no family at the bedside   Severity of Illness: The appropriate patient status for this patient is OBSERVATION. Observation status is judged to be reasonable and necessary in order to provide the required intensity of service to ensure the patient's safety. The patient's presenting symptoms, physical exam findings, and initial radiographic and laboratory data in the context of their medical condition is felt to place them at decreased risk for further clinical deterioration. Furthermore, it is anticipated that the patient will be medically stable for discharge from the hospital within 2 midnights of admission.   Author: Elidia Toribio Furnace, MD 07/06/2024 5:52 PM  For on call review [email or web address].

## 2024-07-06 NOTE — Assessment & Plan Note (Signed)
 Bladder scan in the ED with no significant retention  Continue with tamsulosin   Bladder scan as needed.

## 2024-07-06 NOTE — Assessment & Plan Note (Signed)
 Continue proton pump inhibitor with pantoprazole .

## 2024-07-06 NOTE — ED Triage Notes (Signed)
 Pt arrived via POV c/o sharp epigastric abdominal pain that began last night. Pt reports difficulties with urination and difficulty with having a BM today as well.

## 2024-07-06 NOTE — Assessment & Plan Note (Signed)
 Sepsis present on admission.   Plan to continue antibiotic therapy with IV ceftriaxone  Follow up on cell count, cultures and temperature curve IV fluids with isotonic saline at 75 ml per Hr

## 2024-07-06 NOTE — ED Provider Notes (Signed)
  EMERGENCY DEPARTMENT AT [facility] Provider Note   CSN: [id number] Arrival date & time: 07/06/24  9171     Patient presents with: Abdominal Pain   Daniel Duffy is a 73 y.o. male.   HPI 73 year old male presents with abdominal pain.  Started last night.  Feels like a pressure in his upper abdomen.  It is coming and going though no clear cause for why.  No nausea, vomiting, diarrhea, chest pain, shortness of breath.  Pain is a 10/10.  Does not radiate to his back.  He has not taken anything for the pain.  No prior abdominal surgeries.  Prior to Admission medications   Medication Sig Start Date End Date Taking? Authorizing Provider  atorvastatin  (LIPITOR) 40 MG tablet Take 40 mg by mouth at bedtime. 04/27/23 05/23/24  [provider]  brimonidine  (ALPHAGAN ) 0.2 % ophthalmic solution Place 1 drop into both eyes 2 (two) times daily. 12/14/23   [provider]  cephALEXin  (KEFLEX ) 500 MG capsule Take 1 capsule (500 mg total) by mouth 2 (two) times daily for 7 days. 07/06/24 07/13/24  Freddi Hamilton, MD  ipratropium (ATROVENT ) 0.03 % nasal spray Place 2 sprays into both nostrils 3 (three) times daily. 04/17/24   [provider]  latanoprost  (XALATAN ) 0.005 % ophthalmic solution Place 1 drop into both eyes at bedtime. 12/15/23   [provider]  levocetirizine (XYZAL ) 5 MG tablet Take 5 mg by mouth daily. 05/11/24   [provider]  Multiple Vitamins-Minerals (MULTIVITAMINS THER. W/MINERALS) TABS Take 1 tablet by mouth daily.    [provider]  omeprazole  (PRILOSEC) 40 MG capsule TAKE 1 CAPSULE(40 MG) BY MOUTH DAILY BEFORE BREAKFAST 02/02/24   Rourk, Lamar HERO, MD  polyethylene glycol (MIRALAX ) 17 g packet Take 17 g by mouth daily. 07/30/22   Florea, John K, PA-C  tamsulosin  (FLOMAX ) 0.4 MG CAPS capsule Take 0.4 mg by mouth in the morning.    [provider]    Allergies: Nitrates, organic    Review of Systems   Respiratory:  Negative for shortness of breath.   Cardiovascular:  Negative for chest pain.  Gastrointestinal:  Positive for abdominal pain and constipation. Negative for diarrhea, nausea and vomiting.    Updated Vital Signs BP 128/77   Pulse (!) 103   Temp (!) 103 F (39.4 C) (Oral)   Resp (!) 35   Ht 5' 11 (1.803 m)   Wt 71 kg   SpO2 98%   BMI 21.83 kg/m   Physical Exam Vitals and nursing note reviewed.  Constitutional:      General: He is not in acute distress.    Appearance: He is well-developed. He is not ill-appearing or diaphoretic.  HENT:     Head: Normocephalic and atraumatic.  Cardiovascular:     Rate and Rhythm: Normal rate and regular rhythm.     Heart sounds: Normal heart sounds.  Pulmonary:     Effort: Pulmonary effort is normal.     Breath sounds: Normal breath sounds.  Abdominal:     Palpations: Abdomen is soft.     Tenderness: There is abdominal tenderness in the right upper quadrant and epigastric area.  Skin:    General: Skin is warm and dry.  Neurological:     Mental Status: He is alert.     (all labs ordered are listed, but only abnormal results are displayed) Labs Reviewed  COMPREHENSIVE METABOLIC PANEL WITH GFR - Abnormal; Notable for the following components:  Result Value   Glucose, Bld 122 (*)    Calcium  10.6 (*)    Albumin 3.4 (*)    AST 44 (*)    All other components within normal limits  CBC WITH DIFFERENTIAL/PLATELET - Abnormal; Notable for the following components:   Hemoglobin 12.8 (*)    All other components within normal limits  URINALYSIS, ROUTINE W REFLEX MICROSCOPIC - Abnormal; Notable for the following components:   APPearance HAZY (*)    Hgb urine dipstick LARGE (*)    Protein, ur 30 (*)    Leukocytes,Ua LARGE (*)    Bacteria, UA RARE (*)    All other components within normal limits  URINE CULTURE  LIPASE, BLOOD  TROPONIN I (HIGH SENSITIVITY)  TROPONIN I (HIGH SENSITIVITY)    EKG: EKG  Interpretation Date/Time:  Thursday July 06 2024 09:51:48 EDT Ventricular Rate:  80 PR Interval:  45 QRS Duration:  80 QT Interval:  351 QTC Calculation: 405 R Axis:   80  Text Interpretation: Sinus rhythm Short PR interval Consider right atrial enlargement  ST elevations similar to multiple priors Confirmed by Freddi Hamilton [phone]) on 07/06/2024 9:55:05 AM  Radiology: CT ABDOMEN PELVIS W CONTRAST Result Date: 07/06/2024 CLINICAL DATA:  Epigastric pain EXAM: CT ABDOMEN AND PELVIS WITH CONTRAST TECHNIQUE: Multidetector CT imaging of the abdomen and pelvis was performed using the standard protocol following bolus administration of intravenous contrast. RADIATION DOSE REDUCTION: This exam was performed according to the departmental dose-optimization program which includes automated exposure control, adjustment of the mA and/or kV according to patient size and/or use of iterative reconstruction technique. CONTRAST:  OMNIPAQUE  IOHEXOL  300 MG/ML  SOLN COMPARISON:  CT abdomen/pelvis dated 07/30/2022. FINDINGS: Lower chest: No acute abnormality. Hepatobiliary: Multiple hepatic cysts are again noted with the largest at the posterior right hepatic dome measuring 2.1 cm. Similar scattered subcentimeter focal hypodensities are too small to definitively characterize. Gallbladder is unremarkable. No biliary dilatation. Pancreas: Unremarkable. No pancreatic ductal dilatation or surrounding inflammatory changes. Spleen: Normal in size without focal abnormality. Adrenals/Urinary Tract: Adrenal glands are unremarkable. Kidneys enhance symmetrically. No suspicious focal lesion. No urolithiasis or hydronephrosis. Bladder is mildly distended with mild circumferential bladder wall thickening. Stomach/Bowel: Evaluation of the bowel is limited secondary to lack of enteric contrast and paucity of intra-abdominal fat. Stomach is within normal limits. No evidence of obstruction or focal inflammatory changes.  Moderate-to-large volume of stool throughout the colon. Vascular/Lymphatic: Abdominal aorta is normal in caliber with atherosclerotic calcification. No enlarged abdominal or pelvic lymph nodes. Reproductive: Prostate is enlarged measuring up to 5.6 cm in diameter and indents the base of the bladder. Other: No significant abdominopelvic ascites. No intraperitoneal free air. No abdominal wall hernia. Musculoskeletal: Similar cachectic appearance with paucity of subcutaneous fat. No acute osseous abnormality. No suspicious osseous lesion. Degenerative disc changes of the mid to lower lumbar spine, most pronounced at L4-L5. IMPRESSION: 1. Bladder is mildly distended with mild circumferential bladder wall thickening, which may be secondary to chronic outlet obstruction in the setting of prostatomegaly or cystitis. Recommend correlation with urinalysis. 2. Moderate-to-large volume of stool throughout the colon. 3.  Aortic Atherosclerosis (ICD10-I70.0). Electronically Signed   By: Harrietta Sherry M.D.   On: 07/06/2024 14:05   US  Abdomen Limited RUQ (LIVER/GB) Result Date: 07/06/2024 CLINICAL DATA:  Abdominal pain. EXAM: ULTRASOUND ABDOMEN LIMITED RIGHT UPPER QUADRANT COMPARISON:  Right upper quadrant ultrasound dated 09/10/2023. FINDINGS: Gallbladder: No gallstone, gallbladder wall thickening, or pericholecystic fluid. Negative sonographic Murphy's sign. Common bile duct: Diameter:  2 mm Liver: No focal lesion identified. Within normal limits in parenchymal echogenicity. Portal vein is patent on color Doppler imaging with normal direction of blood flow towards the liver. Other: None. IMPRESSION: Unremarkable right upper quadrant ultrasound. Electronically Signed   By: Vanetta Chou M.D.   On: 07/06/2024 12:30   DG Chest Portable 1 View Result Date: 07/06/2024 CLINICAL DATA:  Upper abdominal pain EXAM: PORTABLE CHEST 1 VIEW COMPARISON:  None Available. FINDINGS: Normal mediastinum and cardiac silhouette. Normal  pulmonary vasculature. No evidence of effusion, infiltrate, or pneumothorax. No acute bony abnormality. IMPRESSION: No acute cardiopulmonary process. Electronically Signed   By: Jackquline Boxer M.D.   On: 07/06/2024 10:15     Procedures   Medications Ordered in the ED  cefTRIAXone  (ROCEPHIN ) 1 g in sodium chloride  0.9 % 100 mL IVPB (1 g Intravenous New Bag/Given 07/06/24 1534)  morphine  (PF) 4 MG/ML injection 4 mg (4 mg Intravenous Given 07/06/24 0948)  sodium chloride  0.9 % bolus 1,000 mL (0 mLs Intravenous Stopped 07/06/24 1516)  iohexol  (OMNIPAQUE ) 300 MG/ML solution 100 mL (100 mLs Intravenous Contrast Given 07/06/24 1317)  acetaminophen  (TYLENOL ) tablet 650 mg (650 mg Oral Given 07/06/24 1531)                                    Medical Decision Making Amount and/or Complexity of Data Reviewed Labs: ordered.    Details: UTI, normal WBC Radiology: ordered and independent interpretation performed.    Details: No bowel obstruction ECG/medicine tests: ordered and independent interpretation performed.    Details: No ischemia  Risk OTC drugs. Prescription drug management.   Patient presents with abdominal pain.  Could be from constipation but could also be from this UTI.  His bladder scan was negligible after he urinated, indicating he does not appear to have any acute urinary retention.  He otherwise felt well enough for discharge though was found to be febrile just prior to discharge.  He does not appear septic or ill-appearing.  He is now tachycardic which goes along with the fever.  Will give a dose of Tylenol  and a dose of Rocephin  and reassess.  Care transferred to Dr. Zammit.     Final diagnoses:  Acute urinary tract infection    ED Discharge Orders          Ordered    cephALEXin  (KEFLEX ) 500 MG capsule  2 times daily,   Status:  Discontinued        07/06/24 1512    cephALEXin  (KEFLEX ) 500 MG capsule  2 times daily        07/06/24 1518               Freddi Hamilton, MD 07/06/24 [phone]

## 2024-07-06 NOTE — Assessment & Plan Note (Signed)
 Continue with atorvastatin

## 2024-07-06 NOTE — Discharge Instructions (Addendum)
 Your urine is concerning for a urinary tract infection.  It is unclear if this is the cause of your pain.  We are putting you on 1 week of antibiotics.  Follow-up with your primary care provider and urologist.  Your CT scan also shows you have a large amount of stool.  Make sure you drink the appropriate amount of water  and take over-the-counter medicine such as MiraLAX  and a stool softener to help have bowel movements.  If you develop worsening, continued, or recurrent abdominal pain, uncontrolled vomiting, fever, chest or back pain, or any other new/concerning symptoms then return to the ER for evaluation.

## 2024-07-06 NOTE — ED Notes (Signed)
 EDP made aware of pt's oral temp of 103.  EDP in to see pt  and VO to hold PO antibiotic, IV antibiotic will be ordered and tylenol .

## 2024-07-07 DIAGNOSIS — K219 Gastro-esophageal reflux disease without esophagitis: Secondary | ICD-10-CM | POA: Diagnosis present

## 2024-07-07 DIAGNOSIS — E785 Hyperlipidemia, unspecified: Secondary | ICD-10-CM | POA: Diagnosis present

## 2024-07-07 DIAGNOSIS — N400 Enlarged prostate without lower urinary tract symptoms: Secondary | ICD-10-CM | POA: Diagnosis present

## 2024-07-07 DIAGNOSIS — N390 Urinary tract infection, site not specified: Secondary | ICD-10-CM | POA: Diagnosis present

## 2024-07-07 DIAGNOSIS — Z83719 Family history of colon polyps, unspecified: Secondary | ICD-10-CM | POA: Diagnosis not present

## 2024-07-07 DIAGNOSIS — Z888 Allergy status to other drugs, medicaments and biological substances status: Secondary | ICD-10-CM | POA: Diagnosis not present

## 2024-07-07 DIAGNOSIS — A419 Sepsis, unspecified organism: Secondary | ICD-10-CM | POA: Diagnosis present

## 2024-07-07 DIAGNOSIS — Z1612 Extended spectrum beta lactamase (ESBL) resistance: Secondary | ICD-10-CM | POA: Diagnosis present

## 2024-07-07 DIAGNOSIS — Z860100 Personal history of colon polyps, unspecified: Secondary | ICD-10-CM | POA: Diagnosis not present

## 2024-07-07 DIAGNOSIS — Z87891 Personal history of nicotine dependence: Secondary | ICD-10-CM | POA: Diagnosis not present

## 2024-07-07 DIAGNOSIS — Z8249 Family history of ischemic heart disease and other diseases of the circulatory system: Secondary | ICD-10-CM | POA: Diagnosis not present

## 2024-07-07 DIAGNOSIS — N3001 Acute cystitis with hematuria: Secondary | ICD-10-CM | POA: Diagnosis not present

## 2024-07-07 DIAGNOSIS — D649 Anemia, unspecified: Secondary | ICD-10-CM | POA: Diagnosis present

## 2024-07-07 DIAGNOSIS — A4151 Sepsis due to Escherichia coli [E. coli]: Secondary | ICD-10-CM | POA: Diagnosis present

## 2024-07-07 DIAGNOSIS — Z79899 Other long term (current) drug therapy: Secondary | ICD-10-CM | POA: Diagnosis not present

## 2024-07-07 LAB — CBC
HCT: 34.9 % — ABNORMAL LOW (ref 39.0–52.0)
Hemoglobin: 11.8 g/dL — ABNORMAL LOW (ref 13.0–17.0)
MCH: 29 pg (ref 26.0–34.0)
MCHC: 33.8 g/dL (ref 30.0–36.0)
MCV: 85.7 fL (ref 80.0–100.0)
Platelets: 160 K/uL (ref 150–400)
RBC: 4.07 MIL/uL — ABNORMAL LOW (ref 4.22–5.81)
RDW: 14.7 % (ref 11.5–15.5)
WBC: 5.3 K/uL (ref 4.0–10.5)
nRBC: 0 % (ref 0.0–0.2)

## 2024-07-07 LAB — BASIC METABOLIC PANEL WITH GFR
Anion gap: 9 (ref 5–15)
BUN: 12 mg/dL (ref 8–23)
CO2: 24 mmol/L (ref 22–32)
Calcium: 9.8 mg/dL (ref 8.9–10.3)
Chloride: 105 mmol/L (ref 98–111)
Creatinine, Ser: 1.01 mg/dL (ref 0.61–1.24)
GFR, Estimated: >60 mL/min (ref >60)
Glucose, Bld: 121 mg/dL — ABNORMAL HIGH (ref 70–99)
Potassium: 3.7 mmol/L (ref 3.5–5.1)
Sodium: 138 mmol/L (ref 135–145)

## 2024-07-07 MED ORDER — LORATADINE 10 MG PO TABS
10.0000 mg | ORAL_TABLET | Freq: Every day | ORAL | Status: DC
  Administered 2024-07-07 – 2024-07-09 (×3): 10 mg via ORAL
  Filled 2024-07-07 (×3): qty 1

## 2024-07-07 NOTE — Plan of Care (Signed)

## 2024-07-07 NOTE — Progress Notes (Signed)
 PROGRESS NOTE  Daniel Duffy  FMW:[id number] DOB: 03-27-1951 DOA: 07/06/2024 PCP: [facility]  Consultants  Brief Narrative: 73 y.o. male with medical history significant of GERD, hyperlipidemia, and BPH who presented with abdominal pain. He reports dysuria and difficulty passing urine for the last 2 to 3 days. Day of presentation he had abdominal pain, moderate in intensity, mid abdomen in location with no radiation or associated fever or chills. He went to his primary care and he was referred to the ED for further evaluation.  Pain improved in ED but he began spiking fevers, thus [facility] called for admission.    Assessment & Plan: * UTI (urinary tract infection) Sepsis present on admission-->now resolved.  LA normal - Currently on IV CTX as abx, will continue   - Overall he feels better today.  Still with both subjective and objective fevers.   - culture showed >100K E coli, sensitivities pending   BPH (benign prostatic hyperplasia) Bladder scan in the ED with no significant retention  Continue with tamsulosin   Bladder scan as needed.    Hyperlipidemia, unspecified Continue with atorvastatin     GERD (gastroesophageal reflux disease) Continue proton pump inhibitor with pantoprazole .   Normocytic anemia: - if persists, would need continued follow-up outpatient and Gi consults    DVT prophylaxis:  enoxaparin  (LOVENOX ) injection 40 mg Start: 07/06/24 2200 SCDs Start: 07/06/24 1934  Code Status:   Code Status: Full Code Level of care: Med-Surg Status is: Inpatient  Consults called: none   Subjective: No complaints this AM.  Able to eat breakfast well.  Still with fevers and chills o/n through this AM.  No N/V  Objective: Vitals:   07/07/24 0353 07/07/24 0457 07/07/24 0552 07/07/24 1100  BP: (!) 132/91 (!) 132/91 119/60 101/66  Pulse: 97 97 82 77  Resp: 18 20 20 18   Temp: (!) 102.9 F (39.4 C) (!) 102.9 F (39.4 C) 99.8 F (37.7 C) 98 F (36.7 C)   TempSrc: Oral Oral Oral Oral  SpO2: 95% 95% 92% 96%  Weight:      Height:        Intake/Output Summary (Last 24 hours) at 07/07/2024 1314 Last data filed at 07/07/2024 0304 Gross per 24 hour  Intake 1811.36 ml  Output 525 ml  Net 1286.36 ml   Filed Weights   07/06/24 0914 07/06/24 1909  Weight: 71 kg 65.6 kg   Body mass index is 20.17 kg/m.  Gen: 73 y.o. male in no apparent distress.  Nontoxic Pulm: Non-labored breathing.  Clear to auscultation bilaterally.  CV: Regular rate and rhythm. No murmur, rub, or gallop. No JVD GI: Abdomen soft, non-distended, some mild tenderness mid epigastrum.  Ext: Warm, no deformities, no pedal edema Skin: No rashes, lesions no ulcers Neuro: Alert and oriented. No focal neurological deficits. Psych: Calm  Judgement and insight appear normal. Mood & affect appropriate.     I have personally reviewed the following labs and images: CBC: Recent Labs  Lab 07/06/24 0937 07/07/24 0504  WBC 7.2 5.3  NEUTROABS 5.3  --   HGB 12.8* 11.8*  HCT 39.2 34.9*  MCV 86.5 85.7  PLT 175 160   BMP &GFR Recent Labs  Lab 07/06/24 0937 07/07/24 0504  NA 137 138  K 4.3 3.7  CL 103 105  CO2 24 24  GLUCOSE 122* 121*  BUN 11 12  CREATININE 1.07 1.01  CALCIUM  10.6* 9.8   Estimated Creatinine Clearance: 62.2 mL/min (by C-G formula based on SCr of 1.01  mg/dL). Liver & Pancreas: Recent Labs  Lab 07/06/24 0937  AST 44*  ALT 35  ALKPHOS 68  BILITOT 0.9  PROT 7.5  ALBUMIN 3.4*   Recent Labs  Lab 07/06/24 0937  LIPASE 26   No results for input(s): AMMONIA in the last 168 hours. Diabetic: No results for input(s): HGBA1C in the last 72 hours. No results for input(s): GLUCAP in the last 168 hours. Cardiac Enzymes: No results for input(s): CKTOTAL, CKMB, CKMBINDEX, TROPONINI in the last 168 hours. No results for input(s): PROBNP in the last 8760 hours. Coagulation Profile: No results for input(s): INR, PROTIME in the last 168  hours. Thyroid  Function Tests: No results for input(s): TSH, T4TOTAL, FREET4, T3FREE, THYROIDAB in the last 72 hours. Lipid Profile: No results for input(s): CHOL, HDL, LDLCALC, TRIG, CHOLHDL, LDLDIRECT in the last 72 hours. Anemia Panel: No results for input(s): VITAMINB12, FOLATE, FERRITIN, TIBC, IRON, RETICCTPCT in the last 72 hours. Urine analysis:    Component Value Date/Time   COLORURINE YELLOW 07/06/2024 1221   APPEARANCEUR HAZY (A) 07/06/2024 1221   APPEARANCEUR Clear 05/08/2024 1339   LABSPEC 1.006 07/06/2024 1221   PHURINE 6.0 07/06/2024 1221   GLUCOSEU NEGATIVE 07/06/2024 1221   HGBUR LARGE (A) 07/06/2024 1221   BILIRUBINUR NEGATIVE 07/06/2024 1221   BILIRUBINUR Negative 05/08/2024 1339   KETONESUR NEGATIVE 07/06/2024 1221   PROTEINUR 30 (A) 07/06/2024 1221   UROBILINOGEN 0.2 01/29/2014 2300   NITRITE NEGATIVE 07/06/2024 1221   LEUKOCYTESUR LARGE (A) 07/06/2024 1221   Sepsis Labs: Invalid input(s): PROCALCITONIN, LACTICIDVEN  Microbiology: Recent Results (from the past 240 hours)  Urine Culture     Status: Abnormal (Preliminary result)   Collection Time: 07/06/24  2:42 PM   Specimen: Urine, Clean Catch  Result Value Ref Range Status   Specimen Description   Final    URINE, CLEAN CATCH Performed at [facility], [address] [place], [place] [address]    Special Requests   Final    NONE Performed at [facility], [address] [place], [place] [address]    Culture (A)  Final    >=100,000 COLONIES/mL ESCHERICHIA COLI SUSCEPTIBILITIES TO FOLLOW Performed at [facility], [address] [place], [place] [address]    Report Status PENDING  Incomplete    Radiology Studies: CT ABDOMEN PELVIS W CONTRAST Result Date: 07/06/2024 CLINICAL DATA:  Epigastric pain EXAM: CT ABDOMEN AND PELVIS WITH CONTRAST TECHNIQUE: Multidetector CT imaging of the abdomen and pelvis was performed using the standard protocol following  bolus administration of intravenous contrast. RADIATION DOSE REDUCTION: This exam was performed according to the departmental dose-optimization program which includes automated exposure control, adjustment of the mA and/or kV according to patient size and/or use of iterative reconstruction technique. CONTRAST:  OMNIPAQUE  IOHEXOL  300 MG/ML  SOLN COMPARISON:  CT abdomen/pelvis dated 07/30/2022. FINDINGS: Lower chest: No acute abnormality. Hepatobiliary: Multiple hepatic cysts are again noted with the largest at the posterior right hepatic dome measuring 2.1 cm. Similar scattered subcentimeter focal hypodensities are too small to definitively characterize. Gallbladder is unremarkable. No biliary dilatation. Pancreas: Unremarkable. No pancreatic ductal dilatation or surrounding inflammatory changes. Spleen: Normal in size without focal abnormality. Adrenals/Urinary Tract: Adrenal glands are unremarkable. Kidneys enhance symmetrically. No suspicious focal lesion. No urolithiasis or hydronephrosis. Bladder is mildly distended with mild circumferential bladder wall thickening. Stomach/Bowel: Evaluation of the bowel is limited secondary to lack of enteric contrast and paucity of intra-abdominal fat. Stomach is within normal limits. No evidence of obstruction  or focal inflammatory changes. Moderate-to-large volume of stool throughout the colon. Vascular/Lymphatic: Abdominal aorta is normal in caliber with atherosclerotic calcification. No enlarged abdominal or pelvic lymph nodes. Reproductive: Prostate is enlarged measuring up to 5.6 cm in diameter and indents the base of the bladder. Other: No significant abdominopelvic ascites. No intraperitoneal free air. No abdominal wall hernia. Musculoskeletal: Similar cachectic appearance with paucity of subcutaneous fat. No acute osseous abnormality. No suspicious osseous lesion. Degenerative disc changes of the mid to lower lumbar spine, most pronounced at L4-L5. IMPRESSION:  1. Bladder is mildly distended with mild circumferential bladder wall thickening, which may be secondary to chronic outlet obstruction in the setting of prostatomegaly or cystitis. Recommend correlation with urinalysis. 2. Moderate-to-large volume of stool throughout the colon. 3.  Aortic Atherosclerosis (ICD10-I70.0). Electronically Signed   By: Harrietta Sherry M.D.   On: 07/06/2024 14:05    Scheduled Meds:  atorvastatin   40 mg Oral QHS   brimonidine   1 drop Both Eyes BID   enoxaparin  (LOVENOX ) injection  40 mg Subcutaneous Q24H   ipratropium  2 spray Each Nare TID   latanoprost   1 drop Both Eyes QHS   loratadine  10 mg Oral Daily   multivitamin with minerals  1 tablet Oral Daily   pantoprazole   40 mg Oral Daily   polyethylene glycol  17 g Oral Daily   tamsulosin   0.4 mg Oral Daily   Continuous Infusions:  sodium chloride  75 mL/hr at 07/07/24 0304   cefTRIAXone  (ROCEPHIN )  IV 1 g (07/07/24 0846)     LOS: 0 days   35 minutes with more than 50% spent in reviewing records, counseling patient/family and coordinating care.  Reyes VEAR Gaw, MD [facility] [email or web address] 07/07/2024, 1:14 PM

## 2024-07-07 NOTE — Progress Notes (Signed)
 Mobility Specialist Progress Note:    07/07/24 1200  Mobility  Activity Ambulated with assistance  Level of Assistance Standby assist, set-up cues, supervision of patient - no hands on  Assistive Device Front wheel walker  Distance Ambulated (ft) 200 ft  Range of Motion/Exercises Active;All extremities  Activity Response Tolerated well  Mobility Referral Yes  Mobility visit 1 Mobility  Mobility Specialist Start Time (ACUTE ONLY) 1200  Mobility Specialist Stop Time (ACUTE ONLY) 1220  Mobility Specialist Time Calculation (min) (ACUTE ONLY) 20 min   Pt received in bed, daughter in room. Agreeable to mobility, required supervision to stand and ambulate with RW. Tolerated well,asx throughout. Returned pt supine,all needs met.  Michaell Grider Mobility Specialist Please contact via Special educational needs teacher or  Rehab office at [phone]

## 2024-07-07 NOTE — Progress Notes (Signed)
   07/07/24 0954  TOC Brief Assessment  Insurance and Status Reviewed  Patient has primary care physician Yes  Home environment has been reviewed From home  Prior level of function: Independent  Prior/Current Home Services No current home services  Social Drivers of Health Review SDOH reviewed no interventions necessary  Readmission risk has been reviewed Yes  Transition of care needs no transition of care needs at this time   Transition of Care Department [facility]) has reviewed patient and no TOC needs have been identified at this time. We will continue to monitor patient advancement through interdisciplinary progression rounds. If new patient transition needs arise, please place a TOC consult.

## 2024-07-07 NOTE — Progress Notes (Signed)
 Mobility Specialist Progress Note:    07/07/24 1025  Mobility  Activity Ambulated with assistance  Level of Assistance Standby assist, set-up cues, supervision of patient - no hands on  Assistive Device Front wheel walker  Distance Ambulated (ft) 200 ft  Range of Motion/Exercises Active;All extremities  Activity Response Tolerated well  Mobility Referral Yes  Mobility visit 1 Mobility  Mobility Specialist Start Time (ACUTE ONLY) 1025  Mobility Specialist Stop Time (ACUTE ONLY) 1045  Mobility Specialist Time Calculation (min) (ACUTE ONLY) 20 min   Pt received in bed, agreeable to mobility. Required SBA to stand and ambulate with RW. Tolerated well,asx throughout. Returned pt supine, alarm on. All needs met.  Naiara Lombardozzi Mobility Specialist Please contact via Special educational needs teacher or  Rehab office at [phone]

## 2024-07-08 DIAGNOSIS — N3001 Acute cystitis with hematuria: Secondary | ICD-10-CM | POA: Diagnosis not present

## 2024-07-08 LAB — BASIC METABOLIC PANEL WITH GFR
Anion gap: 11 (ref 5–15)
BUN: 11 mg/dL (ref 8–23)
CO2: 23 mmol/L (ref 22–32)
Calcium: 9.9 mg/dL (ref 8.9–10.3)
Chloride: 105 mmol/L (ref 98–111)
Creatinine, Ser: 1.07 mg/dL (ref 0.61–1.24)
GFR, Estimated: >60 mL/min (ref >60)
Glucose, Bld: 103 mg/dL — ABNORMAL HIGH (ref 70–99)
Potassium: 3.9 mmol/L (ref 3.5–5.1)
Sodium: 139 mmol/L (ref 135–145)

## 2024-07-08 LAB — CBC
HCT: 36.1 % — ABNORMAL LOW (ref 39.0–52.0)
Hemoglobin: 11.7 g/dL — ABNORMAL LOW (ref 13.0–17.0)
MCH: 28.1 pg (ref 26.0–34.0)
MCHC: 32.4 g/dL (ref 30.0–36.0)
MCV: 86.6 fL (ref 80.0–100.0)
Platelets: 198 K/uL (ref 150–400)
RBC: 4.17 MIL/uL — ABNORMAL LOW (ref 4.22–5.81)
RDW: 14.8 % (ref 11.5–15.5)
WBC: 6.2 K/uL (ref 4.0–10.5)
nRBC: 0 % (ref 0.0–0.2)

## 2024-07-08 LAB — URINE CULTURE: Culture: >=100000 — AB

## 2024-07-08 MED ORDER — ERTAPENEM SODIUM 1 G IJ SOLR: 1.0000 g | INTRAMUSCULAR | Status: DC

## 2024-07-08 MED ORDER — SODIUM CHLORIDE 0.9 % IV SOLN
1.0000 g | Freq: Three times a day (TID) | INTRAVENOUS | Status: DC
  Administered 2024-07-08 – 2024-07-09 (×3): 1 g via INTRAVENOUS
  Filled 2024-07-08 (×3): qty 20

## 2024-07-08 NOTE — Progress Notes (Signed)
 PROGRESS NOTE  Daniel Duffy  FMW:[id number] DOB: 25-Apr-1951 DOA: 07/06/2024 PCP: [facility]  Consultants  Brief Narrative: 73 y.o. male with medical history significant of GERD, hyperlipidemia, and BPH who presented with abdominal pain. He reports dysuria and difficulty passing urine for the last 2 to 3 days. Day of presentation he had abdominal pain, moderate in intensity, mid abdomen in location with no radiation or associated fever or chills. He went to his primary care and he was referred to the ED for further evaluation.  Pain improved in ED but he began spiking fevers, thus [facility] called for admission.    Assessment & Plan: * UTI (urinary tract infection) Sepsis present on admission-->now resolved.  LA normal  - Continues to feel better.  No further abd pain.     - culture showed >100K E coli, ESBL sensitive to Unasyn/zosyn/penems. - Discussed with infectious disease doctor on-call Dr. Lindia.  Recommended carbapenem for several doses and then able to send home.  Also noted that cultures were sensitive to Macrobid  so this could also be an option.   BPH (benign prostatic hyperplasia) Bladder scan in the ED with no significant retention  Continue with tamsulosin   Bladder scan as needed. No issues thus far.     Hyperlipidemia, unspecified Continue with atorvastatin     GERD (gastroesophageal reflux disease) Continue proton pump inhibitor with pantoprazole .   Normocytic anemia: - if persists, would need continued follow-up outpatient and Gi consults    DVT prophylaxis:  enoxaparin  (LOVENOX ) injection 40 mg Start: 07/06/24 2200 SCDs Start: 07/06/24 1934  Code Status:   Code Status: Full Code Level of care: Med-Surg Status is: Inpatient  Consults called: none   Subjective: No complaints this AM.  Able to eat breakfast well.  No further chills.  No N/V  Objective: Vitals:   07/07/24 1816 07/07/24 1923 07/08/24 0304 07/08/24 1301  BP: (!) 146/87 117/78  134/80 117/80  Pulse: 72 71 89 80  Resp:  20 20   Temp: 98.4 F (36.9 C) 98.7 F (37.1 C) 98.4 F (36.9 C) 99 F (37.2 C)  TempSrc: Oral Oral Oral Oral  SpO2: 98% 98% 94% 99%  Weight:      Height:        Intake/Output Summary (Last 24 hours) at 07/08/2024 1440 Last data filed at 07/08/2024 1000 Gross per 24 hour  Intake 1909.25 ml  Output --  Net 1909.25 ml   Filed Weights   07/06/24 0914 07/06/24 1909  Weight: 71 kg 65.6 kg   Body mass index is 20.17 kg/m.  Gen: 73 y.o. male in no apparent distress.  Nontoxic Pulm: Non-labored breathing.  Clear to auscultation bilaterally.  CV: Regular rate and rhythm. No murmur, rub, or gallop. No JVD GI: Abdomen soft, non-distended, some mild tenderness mid epigastrum.  Ext: Warm, no deformities, no pedal edema Skin: No rashes, lesions no ulcers Neuro: Alert and oriented. No focal neurological deficits. Psych: Calm  Judgement and insight appear normal. Mood & affect appropriate.     I have personally reviewed the following labs and images: CBC: Recent Labs  Lab 07/06/24 0937 07/07/24 0504 07/08/24 0458  WBC 7.2 5.3 6.2  NEUTROABS 5.3  --   --   HGB 12.8* 11.8* 11.7*  HCT 39.2 34.9* 36.1*  MCV 86.5 85.7 86.6  PLT 175 160 198   BMP &GFR Recent Labs  Lab 07/06/24 0937 07/07/24 0504 07/08/24 0458  NA 137 138 139  K 4.3 3.7 3.9  CL 103  105 105  CO2 24 24 23   GLUCOSE 122* 121* 103*  BUN 11 12 11   CREATININE 1.07 1.01 1.07  CALCIUM  10.6* 9.8 9.9   Estimated Creatinine Clearance: 58.8 mL/min (by C-G formula based on SCr of 1.07 mg/dL). Liver & Pancreas: Recent Labs  Lab 07/06/24 0937  AST 44*  ALT 35  ALKPHOS 68  BILITOT 0.9  PROT 7.5  ALBUMIN 3.4*   Recent Labs  Lab 07/06/24 0937  LIPASE 26   No results for input(s): AMMONIA in the last 168 hours. Diabetic: No results for input(s): HGBA1C in the last 72 hours. No results for input(s): GLUCAP in the last 168 hours. Cardiac Enzymes: No results for  input(s): CKTOTAL, CKMB, CKMBINDEX, TROPONINI in the last 168 hours. No results for input(s): PROBNP in the last 8760 hours. Coagulation Profile: No results for input(s): INR, PROTIME in the last 168 hours. Thyroid  Function Tests: No results for input(s): TSH, T4TOTAL, FREET4, T3FREE, THYROIDAB in the last 72 hours. Lipid Profile: No results for input(s): CHOL, HDL, LDLCALC, TRIG, CHOLHDL, LDLDIRECT in the last 72 hours. Anemia Panel: No results for input(s): VITAMINB12, FOLATE, FERRITIN, TIBC, IRON, RETICCTPCT in the last 72 hours. Urine analysis:    Component Value Date/Time   COLORURINE YELLOW 07/06/2024 1221   APPEARANCEUR HAZY (A) 07/06/2024 1221   APPEARANCEUR Clear 05/08/2024 1339   LABSPEC 1.006 07/06/2024 1221   PHURINE 6.0 07/06/2024 1221   GLUCOSEU NEGATIVE 07/06/2024 1221   HGBUR LARGE (A) 07/06/2024 1221   BILIRUBINUR NEGATIVE 07/06/2024 1221   BILIRUBINUR Negative 05/08/2024 1339   KETONESUR NEGATIVE 07/06/2024 1221   PROTEINUR 30 (A) 07/06/2024 1221   UROBILINOGEN 0.2 01/29/2014 2300   NITRITE NEGATIVE 07/06/2024 1221   LEUKOCYTESUR LARGE (A) 07/06/2024 1221   Sepsis Labs: Invalid input(s): PROCALCITONIN, LACTICIDVEN  Microbiology: Recent Results (from the past 240 hours)  Urine Culture     Status: Abnormal   Collection Time: 07/06/24  2:42 PM   Specimen: Urine, Clean Catch  Result Value Ref Range Status   Specimen Description   Final    URINE, CLEAN CATCH Performed at [facility], [address], [place], [place] [address]    Special Requests   Final    NONE Performed at [facility], [address], [place], [place] [address]    Culture (A)  Final    >=100,000 COLONIES/mL ESCHERICHIA COLI Confirmed Extended Spectrum Beta-Lactamase Producer (ESBL).  In bloodstream infections from ESBL organisms, carbapenems are preferred over piperacillin/tazobactam. They are shown to have a lower risk of mortality.     Report Status 07/08/2024 FINAL  Final   Organism ID, Bacteria ESCHERICHIA COLI (A)  Final      Susceptibility   Escherichia coli - MIC*    AMPICILLIN >=32 RESISTANT Resistant     CEFAZOLIN >=64 RESISTANT Resistant     CEFEPIME 4 INTERMEDIATE Intermediate     CEFTRIAXONE  >=64 RESISTANT Resistant     CIPROFLOXACIN  >=4 RESISTANT Resistant     GENTAMICIN  <=1 SENSITIVE Sensitive     IMIPENEM <=0.25 SENSITIVE Sensitive     NITROFURANTOIN  <=16 SENSITIVE Sensitive     TRIMETH/SULFA >=320 RESISTANT Resistant     AMPICILLIN/SULBACTAM 8 SENSITIVE Sensitive     PIP/TAZO <=4 SENSITIVE Sensitive ug/mL    * >=100,000 COLONIES/mL ESCHERICHIA COLI    Radiology Studies: No results found.   Scheduled Meds:  atorvastatin   40 mg Oral QHS   brimonidine   1 drop Both Eyes BID   enoxaparin  (LOVENOX ) injection  40 mg Subcutaneous  Q24H   ipratropium  2 spray Each Nare TID   latanoprost   1 drop Both Eyes QHS   loratadine   10 mg Oral Daily   multivitamin with minerals  1 tablet Oral Daily   pantoprazole   40 mg Oral Daily   polyethylene glycol  17 g Oral Daily   tamsulosin   0.4 mg Oral Daily   Continuous Infusions:  meropenem  (MERREM ) IV 1 g (07/08/24 1126)     LOS: 1 day   35 minutes with more than 50% spent in reviewing records, counseling patient/family and coordinating care.  Reyes VEAR Gaw, MD [facility] [email or web address] 07/08/2024, 2:40 PM

## 2024-07-08 NOTE — Progress Notes (Signed)
 Mobility Specialist Progress Note:    07/08/24 0906  Mobility  Activity Ambulated with assistance  Level of Assistance Standby assist, set-up cues, supervision of patient - no hands on  Assistive Device Front wheel walker  Distance Ambulated (ft) 200 ft  Range of Motion/Exercises Active;All extremities  Activity Response Tolerated well  Mobility Referral Yes  Mobility visit 1 Mobility  Mobility Specialist Start Time (ACUTE ONLY) [id number]  Mobility Specialist Stop Time (ACUTE ONLY) 0926  Mobility Specialist Time Calculation (min) (ACUTE ONLY) 20 min   Pt received in bed, agreeable to mobility. Required SBA to stand and ambulate with RW. Tolerated well,asx throughout. Returned pt supine, all needs met.  Ananiah Maciolek Mobility Specialist Please contact via Special educational needs teacher or  Rehab office at [phone]

## 2024-07-08 NOTE — Progress Notes (Signed)
 Mobility Specialist Progress Note:    07/08/24 1250  Mobility  Activity Ambulated with assistance  Level of Assistance Standby assist, set-up cues, supervision of patient - no hands on  Assistive Device Front wheel walker  Distance Ambulated (ft) 400 ft  Range of Motion/Exercises Active;All extremities  Activity Response Tolerated well  Mobility Referral Yes  Mobility visit 1 Mobility  Mobility Specialist Start Time (ACUTE ONLY) 1250  Mobility Specialist Stop Time (ACUTE ONLY) 1310  Mobility Specialist Time Calculation (min) (ACUTE ONLY) 20 min   Pt received in bed, agreeable to mobility. Required supervision to stand and ambulate with RW. Tolerated well,asx throughout. Returned pt supine, all needs met.  Kieron Kantner Mobility Specialist Please contact via Special educational needs teacher or  Rehab office at [phone]

## 2024-07-08 NOTE — Plan of Care (Signed)
   Problem: Clinical Measurements: Goal: Ability to maintain clinical measurements within normal limits will improve Outcome: Progressing

## 2024-07-08 NOTE — Plan of Care (Signed)

## 2024-07-09 DIAGNOSIS — N3001 Acute cystitis with hematuria: Secondary | ICD-10-CM | POA: Diagnosis not present

## 2024-07-09 LAB — BASIC METABOLIC PANEL WITH GFR
Anion gap: 5 (ref 5–15)
BUN: 9 mg/dL (ref 8–23)
CO2: 25 mmol/L (ref 22–32)
Calcium: 9.7 mg/dL (ref 8.9–10.3)
Chloride: 106 mmol/L (ref 98–111)
Creatinine, Ser: 1.09 mg/dL (ref 0.61–1.24)
GFR, Estimated: >60 mL/min (ref >60)
Glucose, Bld: 114 mg/dL — ABNORMAL HIGH (ref 70–99)
Potassium: 3.5 mmol/L (ref 3.5–5.1)
Sodium: 136 mmol/L (ref 135–145)

## 2024-07-09 LAB — CBC
HCT: 34.8 % — ABNORMAL LOW (ref 39.0–52.0)
Hemoglobin: 11.3 g/dL — ABNORMAL LOW (ref 13.0–17.0)
MCH: 28.1 pg (ref 26.0–34.0)
MCHC: 32.5 g/dL (ref 30.0–36.0)
MCV: 86.6 fL (ref 80.0–100.0)
Platelets: 214 K/uL (ref 150–400)
RBC: 4.02 MIL/uL — ABNORMAL LOW (ref 4.22–5.81)
RDW: 14.7 % (ref 11.5–15.5)
WBC: 5.7 K/uL (ref 4.0–10.5)
nRBC: 0 % (ref 0.0–0.2)

## 2024-07-09 MED ORDER — NITROFURANTOIN MONOHYD MACRO 100 MG PO CAPS: 100.0000 mg | ORAL_CAPSULE | Freq: Two times a day (BID) | ORAL | 0 refills | Status: AC

## 2024-07-09 NOTE — Discharge Summary (Signed)
 Physician Discharge Summary  Daniel Duffy FMW:[id number] DOB: Jan 27, 1951 DOA: 07/06/2024  PCP: [facility]  Admit date: 07/06/2024  Discharge date: 07/09/2024  Admitted From:Home  Disposition:  Home  Recommendations for Outpatient Follow-up:  Follow up with PCP in 1-2 weeks Remain on nitrofurantoin  for 2 more days as recommended per ID for ESBL E Coli Remain on home medications as per  Home Health:None  Equipment/Devices:None  Discharge Condition:Stable  CODE STATUS: Full  Diet recommendation: Heart Healthy  Brief/Interim Summary:  73 y.o. male with medical history significant of GERD, hyperlipidemia, and BPH who presented with abdominal pain. He reports dysuria and difficulty passing urine for the last 2 to 3 days. Day of presentation he had abdominal pain, moderate in intensity, mid abdomen in location with no radiation or associated fever or chills. He went to his primary care and he was referred to the ED for further evaluation.  Pain improved in ED but he began spiking fevers, thus [facility] called for admission.  Patient was noted to have growth of E. coli ESBL and was switched over to [place]rem  for treatment.  Case was discussed with ID with recommendations to switch to nitrofurantoin  and complete total 3-day course of treatment.  Overall patient has been doing quite well and is eager for discharge today.  No other acute events or concerns noted.  Discharge Diagnoses:  Principal Problem:   UTI (urinary tract infection) Active Problems:   BPH (benign prostatic hyperplasia)   Hyperlipidemia, unspecified   GERD (gastroesophageal reflux disease)   Sepsis (HCC)  Principal discharge diagnosis: Sepsis, POA secondary to E. coli ESBL UTI.  Discharge Instructions  Discharge Instructions     Diet - low sodium heart healthy   Complete by: As directed    Increase activity slowly   Complete by: As directed       Allergies as of 07/09/2024       Reactions    Nitrates, Organic Other (See Comments)   Could not control of body        Medication List     STOP taking these medications    cephALEXin  500 MG capsule Commonly known as: KEFLEX        TAKE these medications    atorvastatin  40 MG tablet Commonly known as: LIPITOR Take 40 mg by mouth at bedtime.   brimonidine  0.2 % ophthalmic solution Commonly known as: ALPHAGAN  Place 1 drop into both eyes 2 (two) times daily.   ipratropium 0.03 % nasal spray Commonly known as: ATROVENT  Place 2 sprays into both nostrils 3 (three) times daily.   latanoprost  0.005 % ophthalmic solution Commonly known as: XALATAN  Place 1 drop into both eyes at bedtime.   levocetirizine 5 MG tablet Commonly known as: XYZAL  Take 5 mg by mouth daily.   multivitamins ther. w/minerals Tabs tablet Take 1 tablet by mouth daily.   nitrofurantoin  (macrocrystal-monohydrate) 100 MG capsule Commonly known as: Macrobid  Take 1 capsule (100 mg total) by mouth 2 (two) times daily for 2 days.   omeprazole  40 MG capsule Commonly known as: PRILOSEC TAKE 1 CAPSULE(40 MG) BY MOUTH DAILY BEFORE BREAKFAST   polyethylene glycol 17 g packet Commonly known as: MiraLax  Take 17 g by mouth daily. What changed:  when to take this reasons to take this   tamsulosin  0.4 MG Caps capsule Commonly known as: FLOMAX  Take 0.4 mg by mouth in the morning.        Follow-up Information     [facility] [employer]. Call in 1  day.   Specialty: Family Medicine Contact information: [address] [place] [address] [phone]         Nieves Cough, MD. Call in 1 day.   Specialty: Urology Contact information: [address] [place] [address] [phone]         [facility] Emergency Department at [facility] .   Specialty: Emergency Medicine Why: If symptoms worsen Contact information: [address] [place]  [phone]                Allergies  Allergen Reactions   Nitrates, Organic Other (See Comments)    Could not control of body    Consultations: Case discussed with ID 8/2   Procedures/Studies: CT ABDOMEN PELVIS W CONTRAST Result Date: 07/06/2024 CLINICAL DATA:  Epigastric pain EXAM: CT ABDOMEN AND PELVIS WITH CONTRAST TECHNIQUE: Multidetector CT imaging of the abdomen and pelvis was performed using the standard protocol following bolus administration of intravenous contrast. RADIATION DOSE REDUCTION: This exam was performed according to the departmental dose-optimization program which includes automated exposure control, adjustment of the mA and/or kV according to patient size and/or use of iterative reconstruction technique. CONTRAST:  OMNIPAQUE  IOHEXOL  300 MG/ML  SOLN COMPARISON:  CT abdomen/pelvis dated 07/30/2022. FINDINGS: Lower chest: No acute abnormality. Hepatobiliary: Multiple hepatic cysts are again noted with the largest at the posterior right hepatic dome measuring 2.1 cm. Similar scattered subcentimeter focal hypodensities are too small to definitively characterize. Gallbladder is unremarkable. No biliary dilatation. Pancreas: Unremarkable. No pancreatic ductal dilatation or surrounding inflammatory changes. Spleen: Normal in size without focal abnormality. Adrenals/Urinary Tract: Adrenal glands are unremarkable. Kidneys enhance symmetrically. No suspicious focal lesion. No urolithiasis or hydronephrosis. Bladder is mildly distended with mild circumferential bladder wall thickening. Stomach/Bowel: Evaluation of the bowel is limited secondary to lack of enteric contrast and paucity of intra-abdominal fat. Stomach is within normal limits. No evidence of obstruction or focal inflammatory changes. Moderate-to-large volume of stool throughout the colon. Vascular/Lymphatic: Abdominal aorta is normal in caliber with atherosclerotic calcification. No enlarged abdominal or pelvic lymph nodes.  Reproductive: Prostate is enlarged measuring up to 5.6 cm in diameter and indents the base of the bladder. Other: No significant abdominopelvic ascites. No intraperitoneal free air. No abdominal wall hernia. Musculoskeletal: Similar cachectic appearance with paucity of subcutaneous fat. No acute osseous abnormality. No suspicious osseous lesion. Degenerative disc changes of the mid to lower lumbar spine, most pronounced at L4-L5. IMPRESSION: 1. Bladder is mildly distended with mild circumferential bladder wall thickening, which may be secondary to chronic outlet obstruction in the setting of prostatomegaly or cystitis. Recommend correlation with urinalysis. 2. Moderate-to-large volume of stool throughout the colon. 3.  Aortic Atherosclerosis (ICD10-I70.0). Electronically Signed   By: Harrietta Sherry M.D.   On: 07/06/2024 14:05   US  Abdomen Limited RUQ (LIVER/GB) Result Date: 07/06/2024 CLINICAL DATA:  Abdominal pain. EXAM: ULTRASOUND ABDOMEN LIMITED RIGHT UPPER QUADRANT COMPARISON:  Right upper quadrant ultrasound dated 09/10/2023. FINDINGS: Gallbladder: No gallstone, gallbladder wall thickening, or pericholecystic fluid. Negative sonographic Murphy's sign. Common bile duct: Diameter: 2 mm Liver: No focal lesion identified. Within normal limits in parenchymal echogenicity. Portal vein is patent on color Doppler imaging with normal direction of blood flow towards the liver. Other: None. IMPRESSION: Unremarkable right upper quadrant ultrasound. Electronically Signed   By: Vanetta Chou M.D.   On: 07/06/2024 12:30   DG Chest Portable 1 View Result Date: 07/06/2024 CLINICAL DATA:  Upper abdominal pain EXAM: PORTABLE  CHEST 1 VIEW COMPARISON:  None Available. FINDINGS: Normal mediastinum and cardiac silhouette. Normal pulmonary vasculature. No evidence of effusion, infiltrate, or pneumothorax. No acute bony abnormality. IMPRESSION: No acute cardiopulmonary process. Electronically Signed   By: Jackquline Boxer  M.D.   On: 07/06/2024 10:15     Discharge Exam: Vitals:   07/08/24 1930 07/09/24 0359  BP: 126/79 132/78  Pulse: 72 77  Resp: (!) 24 20  Temp: 99.6 F (37.6 C) 98.9 F (37.2 C)  SpO2: 97% 97%   Vitals:   07/08/24 0304 07/08/24 1301 07/08/24 1930 07/09/24 0359  BP: 134/80 117/80 126/79 132/78  Pulse: 89 80 72 77  Resp: 20  (!) 24 20  Temp: 98.4 F (36.9 C) 99 F (37.2 C) 99.6 F (37.6 C) 98.9 F (37.2 C)  TempSrc: Oral Oral Oral Oral  SpO2: 94% 99% 97% 97%  Weight:      Height:        General: Pt is alert, awake, not in acute distress Cardiovascular: RRR, S1/S2 +, no rubs, no gallops Respiratory: CTA bilaterally, no wheezing, no rhonchi Abdominal: Soft, NT, ND, bowel sounds + Extremities: no edema, no cyanosis    The results of significant diagnostics from this hospitalization (including imaging, microbiology, ancillary and laboratory) are listed below for reference.     Microbiology: Recent Results (from the past 240 hours)  Urine Culture     Status: Abnormal   Collection Time: 07/06/24  2:42 PM   Specimen: Urine, Clean Catch  Result Value Ref Range Status   Specimen Description   Final    URINE, CLEAN CATCH Performed at [facility], [address], [place], [place] [address]    Special Requests   Final    NONE Performed at [facility], [address], [place], [place] [address]    Culture (A)  Final    >=100,000 COLONIES/mL ESCHERICHIA COLI Confirmed Extended Spectrum Beta-Lactamase Producer (ESBL).  In bloodstream infections from ESBL organisms, carbapenems are preferred over piperacillin/tazobactam. They are shown to have a lower risk of mortality.    Report Status 07/08/2024 FINAL  Final   Organism ID, Bacteria ESCHERICHIA COLI (A)  Final      Susceptibility   Escherichia coli - MIC*    AMPICILLIN >=32 RESISTANT Resistant     CEFAZOLIN >=64 RESISTANT Resistant     CEFEPIME 4 INTERMEDIATE Intermediate     CEFTRIAXONE  >=64 RESISTANT Resistant      CIPROFLOXACIN  >=4 RESISTANT Resistant     GENTAMICIN  <=1 SENSITIVE Sensitive     IMIPENEM <=0.25 SENSITIVE Sensitive     NITROFURANTOIN  <=16 SENSITIVE Sensitive     TRIMETH/SULFA >=320 RESISTANT Resistant     AMPICILLIN/SULBACTAM 8 SENSITIVE Sensitive     PIP/TAZO <=4 SENSITIVE Sensitive ug/mL    * >=100,000 COLONIES/mL ESCHERICHIA COLI     Labs: BNP (last 3 results) No results for input(s): BNP in the last 8760 hours. Basic Metabolic Panel: Recent Labs  Lab 07/06/24 0937 07/07/24 0504 07/08/24 0458 07/09/24 0441  NA 137 138 139 136  K 4.3 3.7 3.9 3.5  CL 103 105 105 106  CO2 24 24 23 25   GLUCOSE 122* 121* 103* 114*  BUN 11 12 11 9   CREATININE 1.07 1.01 1.07 1.09  CALCIUM  10.6* 9.8 9.9 9.7   Liver Function Tests: Recent Labs  Lab 07/06/24 0937  AST 44*  ALT 35  ALKPHOS 68  BILITOT 0.9  PROT 7.5  ALBUMIN 3.4*   Recent Labs  Lab 07/06/24 [phone]  LIPASE 26   No results for input(s): AMMONIA in the last 168 hours. CBC: Recent Labs  Lab 07/06/24 0937 07/07/24 0504 07/08/24 0458 07/09/24 0441  WBC 7.2 5.3 6.2 5.7  NEUTROABS 5.3  --   --   --   HGB 12.8* 11.8* 11.7* 11.3*  HCT 39.2 34.9* 36.1* 34.8*  MCV 86.5 85.7 86.6 86.6  PLT 175 160 198 214   Cardiac Enzymes: No results for input(s): CKTOTAL, CKMB, CKMBINDEX, TROPONINI in the last 168 hours. BNP: Invalid input(s): POCBNP CBG: No results for input(s): GLUCAP in the last 168 hours. D-Dimer No results for input(s): DDIMER in the last 72 hours. Hgb A1c No results for input(s): HGBA1C in the last 72 hours. Lipid Profile No results for input(s): CHOL, HDL, LDLCALC, TRIG, CHOLHDL, LDLDIRECT in the last 72 hours. Thyroid  function studies No results for input(s): TSH, T4TOTAL, T3FREE, THYROIDAB in the last 72 hours.  Invalid input(s): FREET3 Anemia work up No results for input(s): VITAMINB12, FOLATE, FERRITIN, TIBC, IRON, RETICCTPCT in the last 72  hours. Urinalysis    Component Value Date/Time   COLORURINE YELLOW 07/06/2024 1221   APPEARANCEUR HAZY (A) 07/06/2024 1221   APPEARANCEUR Clear 05/08/2024 1339   LABSPEC 1.006 07/06/2024 1221   PHURINE 6.0 07/06/2024 1221   GLUCOSEU NEGATIVE 07/06/2024 1221   HGBUR LARGE (A) 07/06/2024 1221   BILIRUBINUR NEGATIVE 07/06/2024 1221   BILIRUBINUR Negative 05/08/2024 1339   KETONESUR NEGATIVE 07/06/2024 1221   PROTEINUR 30 (A) 07/06/2024 1221   UROBILINOGEN 0.2 01/29/2014 2300   NITRITE NEGATIVE 07/06/2024 1221   LEUKOCYTESUR LARGE (A) 07/06/2024 1221   Sepsis Labs Recent Labs  Lab 07/06/24 0937 07/07/24 0504 07/08/24 0458 07/09/24 0441  WBC 7.2 5.3 6.2 5.7   Microbiology Recent Results (from the past 240 hours)  Urine Culture     Status: Abnormal   Collection Time: 07/06/24  2:42 PM   Specimen: Urine, Clean Catch  Result Value Ref Range Status   Specimen Description   Final    URINE, CLEAN CATCH Performed at [facility], [address] [place], [place] [address]    Special Requests   Final    NONE Performed at [facility], [address], [place], [place] [address]    Culture (A)  Final    >=100,000 COLONIES/mL ESCHERICHIA COLI Confirmed Extended Spectrum Beta-Lactamase Producer (ESBL).  In bloodstream infections from ESBL organisms, carbapenems are preferred over piperacillin/tazobactam. They are shown to have a lower risk of mortality.    Report Status 07/08/2024 FINAL  Final   Organism ID, Bacteria ESCHERICHIA COLI (A)  Final      Susceptibility   Escherichia coli - MIC*    AMPICILLIN >=32 RESISTANT Resistant     CEFAZOLIN >=64 RESISTANT Resistant     CEFEPIME 4 INTERMEDIATE Intermediate     CEFTRIAXONE  >=64 RESISTANT Resistant     CIPROFLOXACIN  >=4 RESISTANT Resistant     GENTAMICIN  <=1 SENSITIVE Sensitive     IMIPENEM <=0.25 SENSITIVE Sensitive     NITROFURANTOIN  <=16 SENSITIVE Sensitive     TRIMETH/SULFA >=320 RESISTANT Resistant     AMPICILLIN/SULBACTAM  8 SENSITIVE Sensitive     PIP/TAZO <=4 SENSITIVE Sensitive ug/mL    * >=100,000 COLONIES/mL ESCHERICHIA COLI     Time coordinating discharge: 35 minutes  SIGNED:   Adron JONETTA Fairly, DO [facility] 07/09/2024, 10:11 AM  If 7PM-7AM, please contact night-coverage [email or web address]

## 2024-07-10 ENCOUNTER — Telehealth: Payer: Self-pay

## 2024-07-10 NOTE — Transitions of Care (Post Inpatient/ED Visit) (Signed)
 07/10/2024  Name: Daniel Duffy MRN: [id number] DOB: 1951/11/18  Today's TOC FU Call Status: Today's TOC FU Call Status:: Successful TOC FU Call Completed TOC FU Call Complete Date: 07/10/24 Patient's Name and Date of Birth confirmed.  Transition Care Management Follow-up Telephone Call Date of Discharge: 07/09/24 [facility] Type of Discharge: Inpatient Admission Primary Inpatient Discharge Diagnosis:: UTI How have you been since you were released from the hospital?: Better Any questions or concerns?: No  Items Reviewed: Did you receive and understand the discharge instructions provided?: Yes Medications obtained,verified, and reconciled?: Yes (Medications Reviewed) Any new allergies since your discharge?: No Dietary orders reviewed?: Yes Type of Diet Ordered:: Low Sodium Heart Healthy Do you have support at home?: Yes People in Home [RPT]: child(ren), adult Name of Support/Comfort Primary Source: Sabrina Pham  Medications Reviewed Today: Medications Reviewed Today     Reviewed by Moises Reusing, RN (Case Manager) on 07/10/24 at 1445  Med List Status: <None>   Medication Order Taking? Sig Documenting Provider Last Dose Status Informant  atorvastatin  (LIPITOR) 40 MG tablet [id number] Yes Take 40 mg by mouth at bedtime. [provider]  Active Self, Pharmacy Records  brimonidine  (ALPHAGAN ) 0.2 % ophthalmic solution [id number] Yes Place 1 drop into both eyes 2 (two) times daily. [provider]  Active Self, Pharmacy Records  ipratropium (ATROVENT ) 0.03 % nasal spray [id number] Yes Place 2 sprays into both nostrils 3 (three) times daily. [provider]  Active Self, Pharmacy Records  latanoprost  (XALATAN ) 0.005 % ophthalmic solution [id number] Yes Place 1 drop into both eyes at bedtime. [provider]  Active Self, Pharmacy Records  levocetirizine (XYZAL ) 5 MG tablet [id number] Yes Take 5 mg by mouth daily. [provider]  Active Self, Pharmacy Records  Multiple Vitamins-Minerals (MULTIVITAMINS THER. W/MINERALS) CAMILLIA [id number] Yes Take 1 tablet by mouth daily. [provider]  Active Self, Pharmacy Records  nitrofurantoin , macrocrystal-monohydrate, (MACROBID ) 100 MG capsule [id number] Yes Take 1 capsule (100 mg total) by mouth 2 (two) times daily for 2 days. Maree, Pratik D, DO  Active   omeprazole  (PRILOSEC) 40 MG capsule [id number] Yes TAKE 1 CAPSULE(40 MG) BY MOUTH DAILY BEFORE BREAKFAST Rourk, Lamar HERO, MD  Active Self, Pharmacy Records  polyethylene glycol (MIRALAX ) 17 g packet [id number] Yes Take 17 g by mouth daily.  Patient taking differently: Take 17 g by mouth every other day.   [place] Norleen POUR, PA-C  Active Self, Pharmacy Records  tamsulosin  (FLOMAX ) 0.4 MG CAPS capsule [id number] Yes Take 0.4 mg by mouth in the morning. [provider]  Active Self, Pharmacy Records            Home Care and Equipment/Supplies: Were Home Health Services Ordered?: NA Any new equipment or medical supplies ordered?: NA  Functional Questionnaire: Do you need assistance with bathing/showering or dressing?: No Do you need assistance with meal preparation?: No Do you need assistance with eating?: No Do you have difficulty maintaining continence: No Do you need assistance with getting out of bed/getting out of a chair/moving?: No Do you have difficulty managing or taking your medications?: No  Follow up appointments reviewed: PCP Follow-up appointment confirmed?: No (The patient will call. [facility] is a non-[facility] practice and the Care Guides cannot schedule) MD Provider Line Number:[phone] Given: No Specialist Hospital Follow-up appointment confirmed?: No Reason Specialist Follow-Up Not Confirmed: Patient has Specialist Provider Number and will Call for Appointment Do you need transportation to your follow-up appointment?: No Do you understand care  options if your condition(s)  worsen?: Yes-patient verbalized understanding  SDOH Interventions Today    Flowsheet Row Most Recent Value  SDOH Interventions   Food Insecurity Interventions Intervention Not Indicated  Housing Interventions Intervention Not Indicated  Transportation Interventions Intervention Not Indicated  Utilities Interventions Intervention Not Indicated    Goals Addressed             This Visit's Progress    VBCI Transitions of Care (TOC) Care Plan       Problems:  Recent Hospitalization for treatment of BPH, UTI  Goal:  Over the next 30 days, the patient will not experience hospital readmission  Interventions:   Evaluation of current treatment plan related to BPH and UTIself-management and patient's adherence to plan as established by provider. Discussed plans with patient for ongoing care management follow up and provided patient with direct contact information for care management team Evaluation of current treatment plan related to Urinary Tract Infection and patient's adherence to plan as established by provider Reviewed medications with patient and discussed Antibiotics that are prescribed for UTI Discussed plans with patient for ongoing care management follow up and provided patient with direct contact information for care management team Advised patient to discuss Hospitalization and cause of UTI with provider Assessed social determinant of health barriers  Patient Self Care Activities:  Attend all scheduled provider appointments Call pharmacy for medication refills 3-7 days in advance of running out of medications Call provider office for new concerns or questions  Notify RN Care Manager of [facility] call rescheduling needs Participate in Transition of Care Program/Attend [facility] scheduled calls Perform all self care activities independently  Take medications as prescribed    Plan:  Telephone follow up appointment with care management team member scheduled for:  Tuesday August 12th at  2:00pm        Medford Balboa, BSN, RN [place]  VBCI - [facility] RN Care Manager [phone]

## 2024-07-10 NOTE — Patient Instructions (Signed)
 Visit Information  Thank you for taking time to visit with me today. Please don't hesitate to contact me if I can be of assistance to you before our next scheduled telephone appointment.  Our next appointment is by telephone on Tuesday August 12th at 2:00pm  Following is a copy of your care plan:   Goals Addressed             This Visit's Progress    VBCI Transitions of Care (TOC) Care Plan       Problems:  Recent Hospitalization for treatment of BPH, UTI  Goal:  Over the next 30 days, the patient will not experience hospital readmission  Interventions:   Evaluation of current treatment plan related to BPH and UTIself-management and patient's adherence to plan as established by provider. Discussed plans with patient for ongoing care management follow up and provided patient with direct contact information for care management team Evaluation of current treatment plan related to Urinary Tract Infection and patient's adherence to plan as established by provider Reviewed medications with patient and discussed Antibiotics that are prescribed for UTI Discussed plans with patient for ongoing care management follow up and provided patient with direct contact information for care management team Advised patient to discuss Hospitalization and cause of UTI with provider Assessed social determinant of health barriers  Patient Self Care Activities:  Attend all scheduled provider appointments Call pharmacy for medication refills 3-7 days in advance of running out of medications Call provider office for new concerns or questions  Notify RN Care Manager of [facility] call rescheduling needs Participate in Transition of Care Program/Attend [facility] scheduled calls Perform all self care activities independently  Take medications as prescribed    Plan:  Telephone follow up appointment with care management team member scheduled for:  Tuesday August 12th at 2:00pm        Patient verbalizes  understanding of instructions and care plan provided today and agrees to view in MyChart. Active MyChart status and patient understanding of how to access instructions and care plan via MyChart confirmed with patient.     The patient has been provided with contact information for the care management team and has been advised to call with any health related questions or concerns.   Please call the care guide team at [phone] if you need to cancel or reschedule your appointment.   Please call the Suicide and Crisis Lifeline: 988 call the [place]  National Suicide Prevention Lifeline: 2484871449 or TTY: (563)166-8613 TTY [phone]) to talk to a trained counselor if you are experiencing a Mental Health or Behavioral Health Crisis or need someone to talk to.  Medford Balboa, BSN, RN [place]  VBCI - [employer] [facility] RN Care Manager [phone]

## 2024-07-18 ENCOUNTER — Other Ambulatory Visit: Payer: Self-pay

## 2024-07-18 NOTE — Transitions of Care (Post Inpatient/ED Visit) (Signed)
 Transition of Care week 2  Visit Note  07/18/2024  Name: Daniel Duffy MRN: [id number]          DOB: 1951/03/09  Situation: Patient enrolled in [facility] 30-day program. Visit completed with Daniel Duffy by telephone.   Background:   Past Medical History:  Diagnosis Date   Abnormal weight loss    Acid reflux    Acute prostatitis    Acute sinusitis, unspecified    Chronic idiopathic constipation    Colon polyps    Enlarged prostate    Hyperlipidemia, unspecified    Incisional hernia without obstruction or gangrene    Lumbago    Nonspecific elevation of levels of transaminase and lactic acid dehydrogenase (LDH)    Other male erectile dysfunction    Other obstructive and reflux uropathy    Radiculopathy, lumbar region    Rash and other nonspecific skin eruption    Reflux esophagitis     Assessment: Patient Reported Symptoms: Cognitive Cognitive Status: Alert and oriented to person, place, and time      Neurological Neurological Review of Symptoms: No symptoms reported    HEENT HEENT Symptoms Reported: No symptoms reported      Cardiovascular Cardiovascular Symptoms Reported: No symptoms reported Does patient have uncontrolled Hypertension?: No Weight: 155 lb 14.4 oz (70.7 kg)  Respiratory Respiratory Symptoms Reported: No symptoms reported    Endocrine Endocrine Symptoms Reported: No symptoms reported Is patient diabetic?: No    Gastrointestinal Gastrointestinal Symptoms Reported: Constipation Gastrointestinal Management Strategies: Medication therapy, Fluid modification, Activity Gastrointestinal Comment: The patient has been taking Miralax every other day. Recommended to take Miralax Daily    Genitourinary Genitourinary Symptoms Reported: No symptoms reported Genitourinary Comment: the patient has completed his course of antibiotics  Integumentary Integumentary Symptoms Reported: No symptoms reported    Musculoskeletal Musculoskelatal Symptoms Reviewed: No  symptoms reported        Psychosocial Psychosocial Symptoms Reported: No symptoms reported         There were no vitals filed for this visit.  Medications Reviewed Today     Reviewed by Daniel Reusing, RN (Case Manager) on 07/18/24 at 1439  Med List Status: <None>   Medication Order Taking? Sig Documenting Provider Last Dose Status Informant  atorvastatin (LIPITOR) 40 MG tablet [id number]  Take 40 mg by mouth at bedtime. [provider]  Expired 07/10/24 2359 Self, Pharmacy Records  brimonidine (ALPHAGAN) 0.2 % ophthalmic solution [id number]  Place 1 drop into both eyes 2 (two) times daily. [provider]  Active Self, Pharmacy Records  ipratropium (ATROVENT) 0.03 % nasal spray [id number]  Place 2 sprays into both nostrils 3 (three) times daily. [provider]  Active Self, Pharmacy Records  latanoprost (XALATAN) 0.005 % ophthalmic solution [id number]  Place 1 drop into both eyes at bedtime. [provider]  Active Self, Pharmacy Records  levocetirizine (XYZAL) 5 MG tablet [id number]  Take 5 mg by mouth daily. [provider]  Active Self, Pharmacy Records  Multiple Vitamins-Minerals (MULTIVITAMINS THER. W/MINERALS) TABS [id number]  Take 1 tablet by mouth daily. [provider]  Active Self, Pharmacy Records  omeprazole (PRILOSEC) 40 MG capsule [id number]  TAKE 1 CAPSULE(40 MG) BY MOUTH DAILY BEFORE BREAKFAST Duffy, Daniel HERO, MD  Active Self, Pharmacy Records  polyethylene glycol (MIRALAX) 17 g packet [id number]  Take 17 g by mouth daily.  Patient taking differently: Take 17 g by mouth every other day.   Duffy Daniel POUR, PA-C  Active Self, Pharmacy Records  tamsulosin [facility]) 0.4  MG CAPS capsule [id number]  Take 0.4 mg by mouth in the morning. [provider]  Active Self, Pharmacy Records            Recommendation:   Continue Current Plan of Care  Follow Up Plan:   Telephone follow-up in 1 week  Daniel Duffy, BSN,  RN   VBCI - [facility] RN Care Manager [phone]

## 2024-07-18 NOTE — Patient Instructions (Signed)
 Visit Information  Thank you for taking time to visit with me today. Please don't hesitate to contact me if I can be of assistance to you before our next scheduled telephone appointment.  Our next appointment is by telephone on Tuesday August 19th at 11:00am  Following is a copy of your care plan:   Goals Addressed             This Visit's Progress    VBCI Transitions of Care (TOC) Care Plan       Problems: (reviewed 07/18/24) Recent Hospitalization for treatment of BPH, UTI  Goal: (reviewed 07/18/24) Over the next 30 days, the patient will not experience hospital readmission  Interventions: (reviewed 07/18/24)  Evaluation of current treatment plan related to BPH and UTIself-management and patient's adherence to plan as established by provider. Discussed plans with patient for ongoing care management follow up and provided patient with direct contact information for care management team Evaluation of current treatment plan related to Urinary Tract Infection and patient's adherence to plan as established by provider Reviewed medications with patient and discussed Antibiotics that are prescribed for UTI Discussed plans with patient for ongoing care management follow up and provided patient with direct contact information for care management team Advised patient to discuss Hospitalization and cause of UTI with provider Assessed social determinant of health barriers 07/18/24 - The patient has completed his antibiotics  Patient Self Care Activities:  Attend all scheduled provider appointments Call pharmacy for medication refills 3-7 days in advance of running out of medications Call provider office for new concerns or questions  Notify RN Care Manager of [facility] call rescheduling needs Participate in Transition of Care Program/Attend [facility] scheduled calls Perform all self care activities independently  Take medications as prescribed   PCP appointment on 07/19/24 at 10:00am with Dr.  Marvine  Plan:  Telephone follow up appointment with care management team member scheduled for:  Tuesday August 19th at 11:00am        Patient verbalizes understanding of instructions and care plan provided today and agrees to view in [place]. Active MyChart status and patient understanding of how to access instructions and care plan via MyChart confirmed with patient.     The patient has been provided with contact information for the care management team and has been advised to call with any health related questions or concerns.   Please call the care guide team at [phone] if you need to cancel or reschedule your appointment.   Please call the Suicide and Crisis Lifeline: 988 call the [place]  National Suicide Prevention Lifeline: 8434552408 or TTY: (662)020-7621 TTY [phone]) to talk to a trained counselor if you are experiencing a Mental Health or Behavioral Health Crisis or need someone to talk to.  Medford Balboa, BSN, RN [place]  VBCI - [facility] RN Care Manager [phone]

## 2024-07-19 DIAGNOSIS — N390 Urinary tract infection, site not specified: Secondary | ICD-10-CM | POA: Diagnosis not present

## 2024-07-19 DIAGNOSIS — Z6821 Body mass index (BMI) 21.0-21.9, adult: Secondary | ICD-10-CM | POA: Diagnosis not present

## 2024-07-25 ENCOUNTER — Other Ambulatory Visit: Payer: Self-pay

## 2024-07-25 NOTE — Patient Instructions (Signed)
 Visit Information  Thank you for taking time to visit with me today. Please don't hesitate to contact me if I can be of assistance to you before our next scheduled telephone appointment.    Following is a copy of your care plan:   Goals Addressed             This Visit's Progress    COMPLETED: VBCI Transitions of Care (TOC) Care Plan       Problems: (reviewed 07/25/24) Recent Hospitalization for treatment of BPH, UTI  Goal: (reviewed 07/25/24) Over the next 30 days, the patient will not experience hospital readmission  Interventions: (reviewed 07/25/24)  Evaluation of current treatment plan related to BPH and UTIself-management and patient's adherence to plan as established by provider. Discussed plans with patient for ongoing care management follow up and provided patient with direct contact information for care management team Evaluation of current treatment plan related to Urinary Tract Infection and patient's adherence to plan as established by provider Reviewed medications with patient and discussed Antibiotics that are prescribed for UTI Discussed plans with patient for ongoing care management follow up and provided patient with direct contact information for care management team Advised patient to discuss Hospitalization and cause of UTI with provider Assessed social determinant of health barriers 07/18/24 - The patient has completed his antibiotics  Patient Self Care Activities: (reviewed 07/25/24) Attend all scheduled provider appointments Call pharmacy for medication refills 3-7 days in advance of running out of medications Call provider office for new concerns or questions  Notify RN Care Manager of [facility] call rescheduling needs Participate in Transition of Care Program/Attend [facility] scheduled calls Perform all self care activities independently  Take medications as prescribed   PCP appointment on 07/19/24 at 10:00am with Dr. Marvine  Plan:  Telephone follow up appointment  with care management team member scheduled for:  The patient has met his goals and is discharged from the 30 Day [facility] Program        Patient verbalizes understanding of instructions and care plan provided today and agrees to view in MyChart. Active MyChart status and patient understanding of how to access instructions and care plan via MyChart confirmed with patient.     The patient has been provided with contact information for the care management team and has been advised to call with any health related questions or concerns.   Please call the care guide team at [phone] if you need to cancel or reschedule your appointment.   Please call the Suicide and Crisis Lifeline: 988 call the [place]  National Suicide Prevention Lifeline: (631)260-0740 or TTY: (417)480-1922 TTY 262-147-8459) to talk to a trained counselor if you are experiencing a Mental Health or Behavioral Health Crisis or need someone to talk to.  Medford Balboa, BSN, RN [place]  VBCI - [facility] [employer] [facility] RN Care Manager [phone]

## 2024-07-25 NOTE — Transitions of Care (Post Inpatient/ED Visit) (Signed)
 Transition of Care week 3  Visit Note  07/25/2024  Name: Daniel Duffy MRN: [id number]          DOB: [birth year]  Situation: Patient enrolled in [facility] 30-day program. Visit completed with [name] by telephone.   Background:   Past Medical History:  Diagnosis Date   Abnormal weight loss    Acid reflux    Acute prostatitis    Acute sinusitis, unspecified    Chronic idiopathic constipation    Colon polyps    Enlarged prostate    Hyperlipidemia, unspecified    Incisional hernia without obstruction or gangrene    Lumbago    Nonspecific elevation of levels of transaminase and lactic acid dehydrogenase (LDH)    Other male erectile dysfunction    Other obstructive and reflux uropathy    Radiculopathy, lumbar region    Rash and other nonspecific skin eruption    Reflux esophagitis     Assessment: Patient Reported Symptoms: Cognitive Cognitive Status: Alert and oriented to person, place, and time      Neurological Neurological Review of Symptoms: No symptoms reported    HEENT HEENT Symptoms Reported: No symptoms reported      Cardiovascular Cardiovascular Symptoms Reported: No symptoms reported Does patient have uncontrolled Hypertension?: No Weight: 155 lb (70.3 kg)  Respiratory Respiratory Symptoms Reported: No symptoms reported    Endocrine Endocrine Symptoms Reported: No symptoms reported Is patient diabetic?: No    Gastrointestinal Gastrointestinal Symptoms Reported: Constipation Gastrointestinal Comment: Continues to take Miralax     Genitourinary Genitourinary Symptoms Reported: No symptoms reported    Integumentary Integumentary Symptoms Reported: No symptoms reported    Musculoskeletal Musculoskelatal Symptoms Reviewed: No symptoms reported        Psychosocial Psychosocial Symptoms Reported: No symptoms reported         There were no vitals filed for this visit.  Medications Reviewed Today     Reviewed by Moises Reusing, RN (Case Manager)  on 07/25/24 at 1132  Med List Status: <None>   Medication Order Taking? Sig Documenting Provider Last Dose Status Informant  atorvastatin  (LIPITOR) 40 MG tablet [id number]  Take 40 mg by mouth at bedtime. [provider]  Expired 07/10/24 2359 Self, Pharmacy Records  brimonidine  (ALPHAGAN ) 0.2 % ophthalmic solution [id number]  Place 1 drop into both eyes 2 (two) times daily. [provider]  Active Self, Pharmacy Records  ipratropium (ATROVENT ) 0.03 % nasal spray [id number]  Place 2 sprays into both nostrils 3 (three) times daily. [provider]  Active Self, Pharmacy Records  latanoprost  (XALATAN ) 0.005 % ophthalmic solution [id number]  Place 1 drop into both eyes at bedtime. [provider]  Active Self, Pharmacy Records  levocetirizine (XYZAL ) 5 MG tablet [id number]  Take 5 mg by mouth daily. [provider]  Active Self, Pharmacy Records  Multiple Vitamins-Minerals (MULTIVITAMINS THER. W/MINERALS) TABS [id number]  Take 1 tablet by mouth daily. [provider]  Active Self, Pharmacy Records  omeprazole  (PRILOSEC) 40 MG capsule [id number]  TAKE 1 CAPSULE(40 MG) BY MOUTH DAILY BEFORE BREAKFAST Rourk, Lamar HERO, MD  Active Self, Pharmacy Records  polyethylene glycol (MIRALAX ) 17 g packet [id number]  Take 17 g by mouth daily.  Patient taking differently: Take 17 g by mouth every other day.   [place] Norleen POUR, PA-C  Active Self, Pharmacy Records  tamsulosin  (FLOMAX ) 0.4 MG CAPS capsule [id number]  Take 0.4 mg by mouth in the morning. [provider]  Active Self, Pharmacy Records  Recommendation:   The patient feels that he has met his goals and does not need further Outreach calls  Follow Up Plan:   Closing From:  Transitions of Care Program  [facility], BSN, RN [place]  VBCI - [facility] Health RN Care Manager [phone]

## 2024-07-30 ENCOUNTER — Other Ambulatory Visit: Payer: Self-pay | Admitting: Internal Medicine

## 2024-08-07 ENCOUNTER — Encounter (HOSPITAL_COMMUNITY): Payer: Self-pay

## 2024-08-07 ENCOUNTER — Emergency Department (HOSPITAL_COMMUNITY)

## 2024-08-07 ENCOUNTER — Inpatient Hospital Stay (HOSPITAL_COMMUNITY)
Admission: EM | Admit: 2024-08-07 | Discharge: 2024-08-14 | DRG: 872 | Disposition: A | Discharge status: Home Health | Attending: Family Medicine | Admitting: Family Medicine

## 2024-08-07 ENCOUNTER — Other Ambulatory Visit: Payer: Self-pay

## 2024-08-07 DIAGNOSIS — D649 Anemia, unspecified: Secondary | ICD-10-CM | POA: Diagnosis present

## 2024-08-07 DIAGNOSIS — R6520 Severe sepsis without septic shock: Secondary | ICD-10-CM | POA: Diagnosis not present

## 2024-08-07 DIAGNOSIS — N179 Acute kidney failure, unspecified: Secondary | ICD-10-CM | POA: Diagnosis not present

## 2024-08-07 DIAGNOSIS — N402 Nodular prostate without lower urinary tract symptoms: Secondary | ICD-10-CM | POA: Diagnosis not present

## 2024-08-07 DIAGNOSIS — A4151 Sepsis due to Escherichia coli [E. coli]: Secondary | ICD-10-CM | POA: Diagnosis not present

## 2024-08-07 DIAGNOSIS — R1031 Right lower quadrant pain: Secondary | ICD-10-CM | POA: Diagnosis present

## 2024-08-07 DIAGNOSIS — R0682 Tachypnea, not elsewhere classified: Secondary | ICD-10-CM | POA: Diagnosis present

## 2024-08-07 DIAGNOSIS — R0789 Other chest pain: Secondary | ICD-10-CM | POA: Diagnosis present

## 2024-08-07 DIAGNOSIS — Z87440 Personal history of urinary (tract) infections: Secondary | ICD-10-CM | POA: Diagnosis not present

## 2024-08-07 DIAGNOSIS — K219 Gastro-esophageal reflux disease without esophagitis: Secondary | ICD-10-CM | POA: Diagnosis not present

## 2024-08-07 DIAGNOSIS — Z1612 Extended spectrum beta lactamase (ESBL) resistance: Secondary | ICD-10-CM | POA: Diagnosis not present

## 2024-08-07 DIAGNOSIS — Z83719 Family history of colon polyps, unspecified: Secondary | ICD-10-CM

## 2024-08-07 DIAGNOSIS — H409 Unspecified glaucoma: Secondary | ICD-10-CM | POA: Diagnosis present

## 2024-08-07 DIAGNOSIS — Z79899 Other long term (current) drug therapy: Secondary | ICD-10-CM | POA: Diagnosis not present

## 2024-08-07 DIAGNOSIS — R61 Generalized hyperhidrosis: Secondary | ICD-10-CM | POA: Diagnosis present

## 2024-08-07 DIAGNOSIS — E876 Hypokalemia: Secondary | ICD-10-CM | POA: Diagnosis present

## 2024-08-07 DIAGNOSIS — Z87891 Personal history of nicotine dependence: Secondary | ICD-10-CM

## 2024-08-07 DIAGNOSIS — N390 Urinary tract infection, site not specified: Secondary | ICD-10-CM | POA: Diagnosis not present

## 2024-08-07 DIAGNOSIS — B9620 Unspecified Escherichia coli [E. coli] as the cause of diseases classified elsewhere: Secondary | ICD-10-CM | POA: Diagnosis present

## 2024-08-07 DIAGNOSIS — Z888 Allergy status to other drugs, medicaments and biological substances status: Secondary | ICD-10-CM

## 2024-08-07 DIAGNOSIS — A419 Sepsis, unspecified organism: Secondary | ICD-10-CM | POA: Diagnosis present

## 2024-08-07 DIAGNOSIS — R7881 Bacteremia: Secondary | ICD-10-CM | POA: Diagnosis present

## 2024-08-07 DIAGNOSIS — E785 Hyperlipidemia, unspecified: Secondary | ICD-10-CM | POA: Diagnosis not present

## 2024-08-07 DIAGNOSIS — Z8619 Personal history of other infectious and parasitic diseases: Secondary | ICD-10-CM | POA: Diagnosis present

## 2024-08-07 DIAGNOSIS — R109 Unspecified abdominal pain: Principal | ICD-10-CM

## 2024-08-07 DIAGNOSIS — Z8249 Family history of ischemic heart disease and other diseases of the circulatory system: Secondary | ICD-10-CM | POA: Diagnosis not present

## 2024-08-07 DIAGNOSIS — Z860100 Personal history of colon polyps, unspecified: Secondary | ICD-10-CM | POA: Diagnosis not present

## 2024-08-07 DIAGNOSIS — N400 Enlarged prostate without lower urinary tract symptoms: Secondary | ICD-10-CM | POA: Diagnosis present

## 2024-08-07 DIAGNOSIS — R509 Fever, unspecified: Secondary | ICD-10-CM | POA: Diagnosis not present

## 2024-08-07 DIAGNOSIS — R935 Abnormal findings on diagnostic imaging of other abdominal regions, including retroperitoneum: Secondary | ICD-10-CM | POA: Diagnosis not present

## 2024-08-07 LAB — COMPREHENSIVE METABOLIC PANEL WITH GFR
ALT: 25 U/L (ref 0–44)
AST: 36 U/L (ref 15–41)
Albumin: 3.3 g/dL — ABNORMAL LOW (ref 3.5–5.0)
Alkaline Phosphatase: 71 U/L (ref 38–126)
Anion gap: 9 (ref 5–15)
BUN: 16 mg/dL (ref 8–23)
CO2: 25 mmol/L (ref 22–32)
Calcium: 10.3 mg/dL (ref 8.9–10.3)
Chloride: 102 mmol/L (ref 98–111)
Creatinine, Ser: 1.32 mg/dL — ABNORMAL HIGH (ref 0.61–1.24)
GFR, Estimated: 58 mL/min — ABNORMAL LOW (ref >60)
Glucose, Bld: 135 mg/dL — ABNORMAL HIGH (ref 70–99)
Potassium: 3.3 mmol/L — ABNORMAL LOW (ref 3.5–5.1)
Sodium: 136 mmol/L (ref 135–145)
Total Bilirubin: 0.7 mg/dL (ref 0.0–1.2)
Total Protein: 6.9 g/dL (ref 6.5–8.1)

## 2024-08-07 LAB — URINALYSIS, ROUTINE W REFLEX MICROSCOPIC
Bilirubin Urine: NEGATIVE
Glucose, UA: NEGATIVE mg/dL
Ketones, ur: NEGATIVE mg/dL
Nitrite: POSITIVE — AB
Protein, ur: 30 mg/dL — AB
Specific Gravity, Urine: 1.026 (ref 1.005–1.030)
WBC, UA: >50 WBC/hpf (ref 0–5)
pH: 5 (ref 5.0–8.0)

## 2024-08-07 LAB — CBC
HCT: 35.8 % — ABNORMAL LOW (ref 39.0–52.0)
Hemoglobin: 11.7 g/dL — ABNORMAL LOW (ref 13.0–17.0)
MCH: 28.1 pg (ref 26.0–34.0)
MCHC: 32.7 g/dL (ref 30.0–36.0)
MCV: 85.9 fL (ref 80.0–100.0)
Platelets: 168 K/uL (ref 150–400)
RBC: 4.17 MIL/uL — ABNORMAL LOW (ref 4.22–5.81)
RDW: 15.7 % — ABNORMAL HIGH (ref 11.5–15.5)
WBC: 11 K/uL — ABNORMAL HIGH (ref 4.0–10.5)
nRBC: 0 % (ref 0.0–0.2)

## 2024-08-07 LAB — LIPASE, BLOOD: Lipase: 28 U/L (ref 11–51)

## 2024-08-07 LAB — LACTIC ACID, PLASMA: Lactic Acid, Venous: 1.4 mmol/L (ref 0.5–1.9)

## 2024-08-07 MED ORDER — PIPERACILLIN-TAZOBACTAM 3.375 G IVPB
3.3750 g | Freq: Three times a day (TID) | INTRAVENOUS | Status: DC
  Administered 2024-08-07: 3.375 g via INTRAVENOUS
  Filled 2024-08-07: qty 50

## 2024-08-07 MED ORDER — IOHEXOL 300 MG/ML  SOLN
100.0000 mL | Freq: Once | INTRAMUSCULAR | Status: AC | PRN
  Administered 2024-08-07: 100 mL via INTRAVENOUS

## 2024-08-07 MED ORDER — LACTATED RINGERS IV BOLUS
1000.0000 mL | Freq: Once | INTRAVENOUS | Status: AC
  Administered 2024-08-07: 1000 mL via INTRAVENOUS

## 2024-08-07 MED ORDER — POTASSIUM CHLORIDE CRYS ER 20 MEQ PO TBCR
20.0000 meq | EXTENDED_RELEASE_TABLET | Freq: Once | ORAL | Status: AC
  Administered 2024-08-08: 20 meq via ORAL
  Filled 2024-08-07: qty 1

## 2024-08-07 MED ORDER — PIPERACILLIN-TAZOBACTAM 3.375 G IVPB 30 MIN: 3.3750 g | Freq: Three times a day (TID) | INTRAVENOUS | Status: DC

## 2024-08-07 NOTE — ED Provider Notes (Signed)
 [place] EMERGENCY DEPARTMENT AT [facility] Provider Note   CSN: [id number] Arrival date & time: 08/07/24  2022     Patient presents with: Flank Pain   Tremain Rucinski is a 73 y.o. male.    Flank Pain      Presents because of right flank pain/right lower quadrant abdominal pain.  Started today.  No fever or chills that he is aware of before coming to the ED.  Patient states that bowel moods been normal.  No nausea vomit diarrhea.  No obvious dysuria.  No hematuria.  No sick contacts that he is aware of.  Patient states he decided to come in because of worsening pain in this location.  Denies any previous abdominal surgeries.  No chest pain or shortness of breath.   Previous medical history reviewed : She was last discharged on July 09, 2024.  E. coli ESBL.  Switch over to meropenem  treatment.  ID recommended switch to nitrofurantoin .   Prior to Admission medications   Medication Sig Start Date End Date Taking? Authorizing Provider  atorvastatin  (LIPITOR) 40 MG tablet Take 40 mg by mouth at bedtime. 04/27/23 07/10/24  [provider]  brimonidine  (ALPHAGAN ) 0.2 % ophthalmic solution Place 1 drop into both eyes 2 (two) times daily. 12/14/23   [provider]  ipratropium (ATROVENT ) 0.03 % nasal spray Place 2 sprays into both nostrils 3 (three) times daily. 04/17/24   [provider]  latanoprost  (XALATAN ) 0.005 % ophthalmic solution Place 1 drop into both eyes at bedtime. 12/15/23   [provider]  levocetirizine (XYZAL ) 5 MG tablet Take 5 mg by mouth daily. 05/11/24   [provider]  Multiple Vitamins-Minerals (MULTIVITAMINS THER. W/MINERALS) TABS Take 1 tablet by mouth daily.    [provider]  omeprazole  (PRILOSEC) 40 MG capsule TAKE 1 CAPSULE(40 MG) BY MOUTH DAILY BEFORE BREAKFAST 07/31/24   [place] Sonny RAMAN, PA-C  polyethylene glycol (MIRALAX ) 17 g packet Take 17 g by mouth daily. Patient taking differently: Take 17 g by  mouth every other day. 07/30/22   Matt, John K, PA-C  tamsulosin  (FLOMAX ) 0.4 MG CAPS capsule Take 0.4 mg by mouth in the morning.    [provider]    Allergies: Nitrates, organic    Review of Systems  Genitourinary:  Positive for flank pain.    Updated Vital Signs BP 122/66   Pulse 90   Temp (!) 103.3 F (39.6 C) (Oral)   Resp 17   Ht 5' 11 (1.803 m)   Wt 70.3 kg   SpO2 92%   BMI 21.62 kg/m   Physical Exam Vitals and nursing note reviewed.  Constitutional:      General: He is not in acute distress.    Appearance: He is well-developed.  HENT:     Head: Normocephalic and atraumatic.  Eyes:     Conjunctiva/sclera: Conjunctivae normal.  Cardiovascular:     Rate and Rhythm: Normal rate and regular rhythm.     Heart sounds: No murmur heard. Pulmonary:     Effort: Pulmonary effort is normal. No respiratory distress.     Breath sounds: Normal breath sounds.  Abdominal:     Palpations: Abdomen is soft.     Tenderness: There is no abdominal tenderness.  Musculoskeletal:        General: No swelling.     Cervical back: Neck supple.  Skin:    General: Skin is warm and dry.     Capillary Refill: Capillary refill takes  less than 2 seconds.  Neurological:     Mental Status: He is alert.  Psychiatric:        Mood and Affect: Mood normal.     (all labs ordered are listed, but only abnormal results are displayed) Labs Reviewed  COMPREHENSIVE METABOLIC PANEL WITH GFR - Abnormal; Notable for the following components:      Result Value   Potassium 3.3 (*)    Glucose, Bld 135 (*)    Creatinine, Ser 1.32 (*)    Albumin 3.3 (*)    GFR, Estimated 58 (*)    All other components within normal limits  CBC - Abnormal; Notable for the following components:   WBC 11.0 (*)    RBC 4.17 (*)    Hemoglobin 11.7 (*)    HCT 35.8 (*)    RDW 15.7 (*)    All other components within normal limits  CULTURE, BLOOD (ROUTINE X 2)  CULTURE, BLOOD (ROUTINE X 2)  LIPASE, BLOOD   LACTIC ACID, PLASMA  URINALYSIS, ROUTINE W REFLEX MICROSCOPIC    EKG: None  Radiology: CT ABDOMEN PELVIS W CONTRAST Result Date: 08/07/2024 CLINICAL DATA:  Right lower quadrant pain with fever. EXAM: CT ABDOMEN AND PELVIS WITH CONTRAST TECHNIQUE: Multidetector CT imaging of the abdomen and pelvis was performed using the standard protocol following bolus administration of intravenous contrast. RADIATION DOSE REDUCTION: This exam was performed according to the departmental dose-optimization program which includes automated exposure control, adjustment of the mA and/or kV according to patient size and/or use of iterative reconstruction technique. CONTRAST:  OMNIPAQUE  IOHEXOL  300 MG/ML  SOLN COMPARISON:  CT abdomen and pelvis 07/06/2024. FINDINGS: Lower chest: No acute abnormality. Hepatobiliary: Gallbladder is absent. There is no biliary ductal dilatation. There are numerous cysts scattered throughout the liver and hypodensities which are too small to characterize measuring up to 2 cm, unchanged from prior. Pancreas: Unremarkable. No pancreatic ductal dilatation or surrounding inflammatory changes. Spleen: Normal in size without focal abnormality. Adrenals/Urinary Tract: Adrenal glands are unremarkable. Kidneys are normal, without renal calculi, focal lesion, or hydronephrosis. Bladder is unremarkable. Stomach/Bowel: Stomach is within normal limits. The appendix is not seen. There is a large amount of stool throughout the entire colon. No evidence of bowel wall thickening, distention, or inflammatory changes. Vascular/Lymphatic: Aortic atherosclerosis. No enlarged abdominal or pelvic lymph nodes. Reproductive: Prostate gland is enlarged measuring 5.5 cm in transverse dimension. There is a hypodense nodular area in the prostate on left measuring 11 mm. Other: There is a small left inguinal hernia containing nondilated bowel. There is no ascites. Musculoskeletal: There are degenerative changes of the  lower lumbar spine. IMPRESSION: 1. No acute localizing process in the abdomen or pelvis. The appendix is not definitely visualized. 2. Large amount of stool throughout the colon. 3. Small left inguinal hernia containing nondilated bowel. 4. Prostatomegaly with 11 mm hypodense nodule in the left. Correlate with PSA. Aortic Atherosclerosis (ICD10-I70.0). Electronically Signed   By: Greig Pique M.D.   On: 08/07/2024 22:53   DG Chest Portable 1 View Result Date: 08/07/2024 CLINICAL DATA:  Sepsis EXAM: PORTABLE CHEST 1 VIEW COMPARISON:  None Available. FINDINGS: The heart size and mediastinal contours are within normal limits. Both lungs are clear. The visualized skeletal structures are unremarkable. IMPRESSION: No active disease. Electronically Signed   By: Dorethia Molt M.D.   On: 08/07/2024 21:55     Procedures   Medications Ordered in the ED  piperacillin -tazobactam (ZOSYN ) IVPB 3.375 g (0 g Intravenous Stopped 08/07/24 2235)  potassium chloride  SA (KLOR-CON  M) CR tablet 20 mEq (has no administration in time range)  lactated ringers  bolus 1,000 mL (0 mLs Intravenous Stopped 08/07/24 2312)  lactated ringers  bolus 1,000 mL (1,000 mLs Intravenous New Bag/Given 08/07/24 2201)  iohexol  (OMNIPAQUE ) 300 MG/ML solution 100 mL (100 mLs Intravenous Contrast Given 08/07/24 2216)                                    Medical Decision Making Amount and/or Complexity of Data Reviewed Labs: ordered. Radiology: ordered.  Risk Prescription drug management. Decision regarding hospitalization.    Presents because of right flank pain/right lower quadrant abdominal pain.  Started today.  No fever or chills that he is aware of before coming to the ED.  Patient states that bowel moods been normal.  No nausea vomit diarrhea.  No obvious dysuria.  No hematuria.  No sick contacts that he is aware of.  Patient states he decided to come in because of worsening pain in this location.  Denies any previous abdominal  surgeries.  No chest pain or shortness of breath.   Previous medical history reviewed : She was last discharged on July 09, 2024.  E. coli ESBL.  Switch over to meropenem  treatment.  ID recommended switch to nitrofurantoin .   On exam, patient slightly febrile.  103 degree temp.  Maps appropriate.  O2 saturation 96% on room air.  No tachycardia.   Pain to Palpation right lower quadrant.  Laboratory workup shows small leukocytosis.  Mild hypokalemia.  Being replaced orally.   In the setting of patient's abdominal pain.  I did obtain CT scan of the abdomen.  Wanted to rule out pathology such as appendicitis as well as evidence of any kind of pyelonephritis.  CT scan unremarkable for acute pathology.  No clear infectious or surgical pathology at this time.  Personally reviewed all the patient's previous urine culture data from July 31.  Sensitive to Zosyn .   In the setting of patient's drug-resistant UTI, high fever, concern for recurrent UTI given location of patient's pain.  Patient started on Zosyn .  Patient received 2 L of fluid in the ED as well.  Lactic unremarkable.   No other source of infection at this point time.  No chest pain or shortness of breath.  No hemoptysis.  No sick contacts.        Final diagnoses:  Flank pain  Hypokalemia    ED Discharge Orders     None          Simon Lavonia SAILOR, MD 08/07/24 2318

## 2024-08-07 NOTE — ED Triage Notes (Signed)
 Pt c/o right flank area that started today. No N/V or urinary symptoms. Pain has been intermittent but is now more constant.

## 2024-08-08 DIAGNOSIS — A419 Sepsis, unspecified organism: Secondary | ICD-10-CM | POA: Diagnosis not present

## 2024-08-08 DIAGNOSIS — D649 Anemia, unspecified: Secondary | ICD-10-CM | POA: Diagnosis not present

## 2024-08-08 DIAGNOSIS — B9620 Unspecified Escherichia coli [E. coli] as the cause of diseases classified elsewhere: Secondary | ICD-10-CM | POA: Diagnosis present

## 2024-08-08 DIAGNOSIS — R509 Fever, unspecified: Secondary | ICD-10-CM | POA: Insufficient documentation

## 2024-08-08 DIAGNOSIS — Z83719 Family history of colon polyps, unspecified: Secondary | ICD-10-CM | POA: Diagnosis not present

## 2024-08-08 DIAGNOSIS — R6520 Severe sepsis without septic shock: Secondary | ICD-10-CM | POA: Diagnosis not present

## 2024-08-08 DIAGNOSIS — R1031 Right lower quadrant pain: Secondary | ICD-10-CM | POA: Diagnosis present

## 2024-08-08 DIAGNOSIS — R7881 Bacteremia: Secondary | ICD-10-CM | POA: Diagnosis present

## 2024-08-08 DIAGNOSIS — K219 Gastro-esophageal reflux disease without esophagitis: Secondary | ICD-10-CM | POA: Diagnosis not present

## 2024-08-08 DIAGNOSIS — N402 Nodular prostate without lower urinary tract symptoms: Secondary | ICD-10-CM | POA: Diagnosis present

## 2024-08-08 DIAGNOSIS — Z1612 Extended spectrum beta lactamase (ESBL) resistance: Secondary | ICD-10-CM | POA: Diagnosis not present

## 2024-08-08 DIAGNOSIS — Z87440 Personal history of urinary (tract) infections: Secondary | ICD-10-CM | POA: Diagnosis not present

## 2024-08-08 DIAGNOSIS — Z8619 Personal history of other infectious and parasitic diseases: Secondary | ICD-10-CM | POA: Diagnosis not present

## 2024-08-08 DIAGNOSIS — E785 Hyperlipidemia, unspecified: Secondary | ICD-10-CM | POA: Diagnosis not present

## 2024-08-08 DIAGNOSIS — N400 Enlarged prostate without lower urinary tract symptoms: Secondary | ICD-10-CM | POA: Diagnosis present

## 2024-08-08 DIAGNOSIS — Z860100 Personal history of colon polyps, unspecified: Secondary | ICD-10-CM | POA: Diagnosis not present

## 2024-08-08 DIAGNOSIS — Z888 Allergy status to other drugs, medicaments and biological substances status: Secondary | ICD-10-CM | POA: Diagnosis not present

## 2024-08-08 DIAGNOSIS — R0682 Tachypnea, not elsewhere classified: Secondary | ICD-10-CM | POA: Diagnosis present

## 2024-08-08 DIAGNOSIS — N179 Acute kidney failure, unspecified: Secondary | ICD-10-CM | POA: Diagnosis not present

## 2024-08-08 DIAGNOSIS — E876 Hypokalemia: Secondary | ICD-10-CM | POA: Diagnosis present

## 2024-08-08 DIAGNOSIS — Z8249 Family history of ischemic heart disease and other diseases of the circulatory system: Secondary | ICD-10-CM | POA: Diagnosis not present

## 2024-08-08 DIAGNOSIS — N390 Urinary tract infection, site not specified: Secondary | ICD-10-CM | POA: Diagnosis not present

## 2024-08-08 DIAGNOSIS — Z79899 Other long term (current) drug therapy: Secondary | ICD-10-CM | POA: Diagnosis not present

## 2024-08-08 DIAGNOSIS — A4151 Sepsis due to Escherichia coli [E. coli]: Secondary | ICD-10-CM | POA: Diagnosis present

## 2024-08-08 DIAGNOSIS — Z87891 Personal history of nicotine dependence: Secondary | ICD-10-CM | POA: Diagnosis not present

## 2024-08-08 DIAGNOSIS — H409 Unspecified glaucoma: Secondary | ICD-10-CM | POA: Diagnosis present

## 2024-08-08 DIAGNOSIS — R0789 Other chest pain: Secondary | ICD-10-CM | POA: Diagnosis present

## 2024-08-08 LAB — BLOOD CULTURE ID PANEL (REFLEXED) - BCID2

## 2024-08-08 LAB — CBC
HCT: 32 % — ABNORMAL LOW (ref 39.0–52.0)
Hemoglobin: 10.4 g/dL — ABNORMAL LOW (ref 13.0–17.0)
MCH: 28 pg (ref 26.0–34.0)
MCHC: 32.5 g/dL (ref 30.0–36.0)
MCV: 86.3 fL (ref 80.0–100.0)
Platelets: 144 K/uL — ABNORMAL LOW (ref 150–400)
RBC: 3.71 MIL/uL — ABNORMAL LOW (ref 4.22–5.81)
RDW: 15.9 % — ABNORMAL HIGH (ref 11.5–15.5)
WBC: 11.5 K/uL — ABNORMAL HIGH (ref 4.0–10.5)
nRBC: 0 % (ref 0.0–0.2)

## 2024-08-08 LAB — BASIC METABOLIC PANEL WITH GFR
Anion gap: 10 (ref 5–15)
BUN: 16 mg/dL (ref 8–23)
CO2: 25 mmol/L (ref 22–32)
Calcium: 10.2 mg/dL (ref 8.9–10.3)
Chloride: 103 mmol/L (ref 98–111)
Creatinine, Ser: 1.23 mg/dL (ref 0.61–1.24)
GFR, Estimated: >60 mL/min (ref >60)
Glucose, Bld: 163 mg/dL — ABNORMAL HIGH (ref 70–99)
Potassium: 3.4 mmol/L — ABNORMAL LOW (ref 3.5–5.1)
Sodium: 138 mmol/L (ref 135–145)

## 2024-08-08 LAB — MAGNESIUM: Magnesium: 1.7 mg/dL (ref 1.7–2.4)

## 2024-08-08 MED ORDER — ACETAMINOPHEN 325 MG PO TABS
650.0000 mg | ORAL_TABLET | Freq: Four times a day (QID) | ORAL | Status: DC | PRN
  Administered 2024-08-08 – 2024-08-09 (×2): 650 mg via ORAL
  Filled 2024-08-08 (×3): qty 2

## 2024-08-08 MED ORDER — PANTOPRAZOLE SODIUM 40 MG PO TBEC
40.0000 mg | DELAYED_RELEASE_TABLET | Freq: Every day | ORAL | Status: DC
  Administered 2024-08-08 – 2024-08-14 (×7): 40 mg via ORAL
  Filled 2024-08-08 (×7): qty 1

## 2024-08-08 MED ORDER — BRIMONIDINE TARTRATE 0.2 % OP SOLN
1.0000 [drp] | Freq: Two times a day (BID) | OPHTHALMIC | Status: DC
  Administered 2024-08-08 – 2024-08-14 (×14): 1 [drp] via OPHTHALMIC
  Filled 2024-08-08 (×2): qty 5

## 2024-08-08 MED ORDER — PROCHLORPERAZINE EDISYLATE 10 MG/2ML IJ SOLN: 5.0000 mg | Freq: Four times a day (QID) | INTRAMUSCULAR | Status: DC | PRN

## 2024-08-08 MED ORDER — TAMSULOSIN HCL 0.4 MG PO CAPS
0.4000 mg | ORAL_CAPSULE | Freq: Every day | ORAL | Status: DC
  Administered 2024-08-08 – 2024-08-10 (×3): 0.4 mg via ORAL
  Filled 2024-08-08 (×3): qty 1

## 2024-08-08 MED ORDER — ACETAMINOPHEN 650 MG RE SUPP: 650.0000 mg | Freq: Four times a day (QID) | RECTAL | Status: DC | PRN

## 2024-08-08 MED ORDER — IBUPROFEN 600 MG PO TABS
600.0000 mg | ORAL_TABLET | Freq: Four times a day (QID) | ORAL | Status: DC | PRN
  Administered 2024-08-08: 600 mg via ORAL
  Filled 2024-08-08: qty 1

## 2024-08-08 MED ORDER — POTASSIUM CHLORIDE CRYS ER 20 MEQ PO TBCR: 20.0000 meq | EXTENDED_RELEASE_TABLET | Freq: Once | ORAL | Status: DC

## 2024-08-08 MED ORDER — ENOXAPARIN SODIUM 40 MG/0.4ML IJ SOSY
40.0000 mg | PREFILLED_SYRINGE | INTRAMUSCULAR | Status: DC
  Administered 2024-08-08 – 2024-08-14 (×7): 40 mg via SUBCUTANEOUS
  Filled 2024-08-08 (×7): qty 0.4

## 2024-08-08 MED ORDER — POTASSIUM CHLORIDE CRYS ER 20 MEQ PO TBCR
20.0000 meq | EXTENDED_RELEASE_TABLET | Freq: Once | ORAL | Status: AC
  Administered 2024-08-08: 20 meq via ORAL
  Filled 2024-08-08: qty 1

## 2024-08-08 MED ORDER — SENNA 8.6 MG PO TABS
1.0000 | ORAL_TABLET | Freq: Two times a day (BID) | ORAL | Status: DC
  Administered 2024-08-08 – 2024-08-11 (×8): 8.6 mg via ORAL
  Filled 2024-08-08 (×8): qty 1

## 2024-08-08 MED ORDER — BISACODYL 5 MG PO TBEC: 5.0000 mg | DELAYED_RELEASE_TABLET | Freq: Every day | ORAL | Status: DC | PRN

## 2024-08-08 MED ORDER — POLYETHYLENE GLYCOL 3350 17 G PO PACK
17.0000 g | PACK | Freq: Every day | ORAL | Status: DC | PRN
Start: 2024-08-08 — End: 2024-08-14

## 2024-08-08 MED ORDER — SODIUM CHLORIDE 0.9 % IV SOLN
1.0000 g | Freq: Three times a day (TID) | INTRAVENOUS | Status: DC
  Administered 2024-08-08 – 2024-08-11 (×10): 1 g via INTRAVENOUS
  Filled 2024-08-08 (×10): qty 20

## 2024-08-08 MED ORDER — ATORVASTATIN CALCIUM 40 MG PO TABS
40.0000 mg | ORAL_TABLET | Freq: Every day | ORAL | Status: DC
  Administered 2024-08-08 – 2024-08-13 (×7): 40 mg via ORAL
  Filled 2024-08-08 (×7): qty 1

## 2024-08-08 MED ORDER — OXYCODONE HCL 5 MG PO TABS: 5.0000 mg | ORAL_TABLET | ORAL | Status: DC | PRN

## 2024-08-08 MED ORDER — LATANOPROST 0.005 % OP SOLN
1.0000 [drp] | Freq: Every day | OPHTHALMIC | Status: DC
  Administered 2024-08-08 – 2024-08-13 (×7): 1 [drp] via OPHTHALMIC
  Filled 2024-08-08 (×2): qty 2.5

## 2024-08-08 NOTE — Plan of Care (Signed)

## 2024-08-08 NOTE — H&P (Signed)
 History and Physical    Daniel Duffy FMW:[id number] DOB: 17-Jan-1951 DOA: 08/07/2024  PCP: [facility]   Patient coming from: Home   Chief Complaint: RLQ abdominal pain, sweats   HPI: Daniel Duffy is a 73 y.o. male with medical history significant for hyperlipidemia, enlarged prostate, and UTI due to ESBL E. coli in July 2025 who now presents with right lower quadrant abdominal pain and sweats.  Patient reports developing diaphoresis yesterday and then right lower quadrant abdominal pain today.  He denies any associated dysuria, nausea, vomiting, chest pain, or shortness of breath.  ED Course: Upon arrival to the ED, patient is found to be febrile to 39.6 C and saturating mid 90s on room air with normal RR, normal HR, and stable BP.  Labs are most notable for potassium 3.3, creatinine 1.32, WBC 11,000, and lactic acid 1.4.  There are no acute findings on chest x-ray or on CT of the abdomen and pelvis.  Blood cultures were collected and the patient was given 2 L LR, oral potassium, and Zosyn  in the ED.  Review of Systems:  All other systems reviewed and apart from HPI, are negative.  Past Medical History:  Diagnosis Date   Abnormal weight loss    Acid reflux    Acute prostatitis    Acute sinusitis, unspecified    Chronic idiopathic constipation    Colon polyps    Enlarged prostate    Hyperlipidemia, unspecified    Incisional hernia without obstruction or gangrene    Lumbago    Nonspecific elevation of levels of transaminase and lactic acid dehydrogenase (LDH)    Other male erectile dysfunction    Other obstructive and reflux uropathy    Radiculopathy, lumbar region    Rash and other nonspecific skin eruption    Reflux esophagitis     Past Surgical History:  Procedure Laterality Date   BIOPSY  04/05/2023   Procedure: BIOPSY;  Surgeon: Shaaron Lamar HERO, MD;  [facility]: [facility];  Service: Endoscopy;;   COLONOSCOPY  12/29/2005   MFM:[id number]  hemorrhoids, anal papilla/Diminutive polyp on a stalk at 10 cm/otherwise normal rectum and colon, PATH: hyperplastic polyp   COLONOSCOPY N/A 04/10/2013   RMR: tubular adenoma: next TCS 04/2018   COLONOSCOPY WITH PROPOFOL  N/A 02/10/2018   Surgeon: Shaaron Lamar HERO, MD;  pancolonic diverticulosis, otherwise normal exam.  Recommended repeat colonoscopy in 5 years for surveillance due to history of colon polyps.   COLONOSCOPY WITH PROPOFOL  N/A 04/05/2023   Procedure: COLONOSCOPY WITH PROPOFOL ;  Surgeon: Shaaron Lamar HERO, MD;  Location: [facility];  Service: Endoscopy;  Laterality: N/A;  11:15 am   ESOPHAGOGASTRODUODENOSCOPY  12/29/2005   MFM:[id number] Schatzki's ring, small hiatal hernia, normal gastric mucosa, normal D1 and D2.    ESOPHAGOGASTRODUODENOSCOPY (EGD) WITH PROPOFOL  N/A 04/05/2023   Procedure: ESOPHAGOGASTRODUODENOSCOPY (EGD) WITH PROPOFOL ;  Surgeon: Shaaron Lamar HERO, MD;  Location: [facility];  Service: Endoscopy;  Laterality: N/A;   HAND SURGERY     bilateral, got caught in a machine, two different occasions, right hand originally, than left hand. Left hand with skin graft   SPINAL CORD DECOMPRESSION      Social History:   reports that he has quit smoking. He has been exposed to tobacco smoke. He has never used smokeless tobacco. He reports that he does not drink alcohol and does not use drugs.  Allergies  Allergen Reactions   Nitrates, Organic Other (See Comments)    Could not control of body  Family History  Problem Relation Age of Onset   CAD Brother    Colon polyps Brother      Prior to Admission medications   Medication Sig Start Date End Date Taking? Authorizing Provider  atorvastatin  (LIPITOR) 40 MG tablet Take 40 mg by mouth at bedtime. 04/27/23 07/10/24  [provider]  brimonidine  (ALPHAGAN ) 0.2 % ophthalmic solution Place 1 drop into both eyes 2 (two) times daily. 12/14/23   [provider]  ipratropium (ATROVENT ) 0.03 % nasal spray Place 2  sprays into both nostrils 3 (three) times daily. 04/17/24   [provider]  latanoprost  (XALATAN ) 0.005 % ophthalmic solution Place 1 drop into both eyes at bedtime. 12/15/23   [provider]  levocetirizine (XYZAL ) 5 MG tablet Take 5 mg by mouth daily. 05/11/24   [provider]  Multiple Vitamins-Minerals (MULTIVITAMINS THER. W/MINERALS) TABS Take 1 tablet by mouth daily.    [provider]  omeprazole  (PRILOSEC) 40 MG capsule TAKE 1 CAPSULE(40 MG) BY MOUTH DAILY BEFORE BREAKFAST 07/31/24   Ezzard Sonny RAMAN, PA-C  polyethylene glycol (MIRALAX ) 17 g packet Take 17 g by mouth daily. Patient taking differently: Take 17 g by mouth every other day. 07/30/22   Strauch, John K, PA-C  tamsulosin  (FLOMAX ) 0.4 MG CAPS capsule Take 0.4 mg by mouth in the morning.    [provider]    Physical Exam: Vitals:   08/07/24 2100 08/07/24 2130 08/07/24 2200 08/07/24 2219  BP: 125/63 108/66 122/66   Pulse: 96 91 90   Resp:      Temp:      TempSrc:      SpO2: 92% 93% 91% 92%  Weight: 70.3 kg     Height: 5' 11 (1.803 m)       Constitutional: NAD, calm  Eyes: PERTLA, lids and conjunctivae normal ENMT: Mucous membranes are moist. Posterior pharynx clear of any exudate or lesions.   Neck: supple, no masses  Respiratory: no wheezing, no crackles. No accessory muscle use.  Cardiovascular: S1 & S2 heard, regular rate and rhythm. No extremity edema.   Abdomen: Soft, tender in RLQ, no guarding. Bowel sounds active.  Musculoskeletal: no clubbing / cyanosis. No joint deformity upper and lower extremities.   Skin: no significant rashes, lesions, ulcers. Warm, dry, well-perfused. Neurologic: CN 2-12 grossly intact. Moving all extremities. Alert and oriented.  Psychiatric: Pleasant. Cooperative.    Labs and Imaging on Admission: I have personally reviewed following labs and imaging studies  CBC: Recent Labs  Lab 08/07/24 2107  WBC 11.0*  HGB 11.7*  HCT 35.8*  MCV  85.9  PLT 168   Basic Metabolic Panel: Recent Labs  Lab 08/07/24 2107  NA 136  K 3.3*  CL 102  CO2 25  GLUCOSE 135*  BUN 16  CREATININE 1.32*  CALCIUM  10.3   GFR: Estimated Creatinine Clearance: 51 mL/min (A) (by C-G formula based on SCr of 1.32 mg/dL (H)). Liver Function Tests: Recent Labs  Lab 08/07/24 2107  AST 36  ALT 25  ALKPHOS 71  BILITOT 0.7  PROT 6.9  ALBUMIN 3.3*   Recent Labs  Lab 08/07/24 2107  LIPASE 28   No results for input(s): AMMONIA in the last 168 hours. Coagulation Profile: No results for input(s): INR, PROTIME in the last 168 hours. Cardiac Enzymes: No results for input(s): CKTOTAL, CKMB, CKMBINDEX, TROPONINI in the last 168 hours. BNP (last 3 results) No results for input(s): PROBNP in the last 8760 hours. HbA1C: No results  for input(s): HGBA1C in the last 72 hours. CBG: No results for input(s): GLUCAP in the last 168 hours. Lipid Profile: No results for input(s): CHOL, HDL, LDLCALC, TRIG, CHOLHDL, LDLDIRECT in the last 72 hours. Thyroid  Function Tests: No results for input(s): TSH, T4TOTAL, FREET4, T3FREE, THYROIDAB in the last 72 hours. Anemia Panel: No results for input(s): VITAMINB12, FOLATE, FERRITIN, TIBC, IRON, RETICCTPCT in the last 72 hours. Urine analysis:    Component Value Date/Time   COLORURINE YELLOW 08/07/2024 2305   APPEARANCEUR HAZY (A) 08/07/2024 2305   APPEARANCEUR Clear 05/08/2024 1339   LABSPEC 1.026 08/07/2024 2305   PHURINE 5.0 08/07/2024 2305   GLUCOSEU NEGATIVE 08/07/2024 2305   HGBUR MODERATE (A) 08/07/2024 2305   BILIRUBINUR NEGATIVE 08/07/2024 2305   BILIRUBINUR Negative 05/08/2024 1339   KETONESUR NEGATIVE 08/07/2024 2305   PROTEINUR 30 (A) 08/07/2024 2305   UROBILINOGEN 0.2 01/29/2014 2300   NITRITE POSITIVE (A) 08/07/2024 2305   LEUKOCYTESUR LARGE (A) 08/07/2024 2305   Sepsis Labs: @LABRCNTIP (procalcitonin:4,lacticidven:4) ) Recent Results  (from the past 240 hours)  Culture, blood (routine x 2)     Status: None (Preliminary result)   Collection Time: 08/07/24  9:07 PM   Specimen: BLOOD  Result Value Ref Range Status   Specimen Description BLOOD LEFT ANTECUBITAL  Final   Special Requests   Final    BOTTLES DRAWN AEROBIC AND ANAEROBIC Blood Culture adequate volume Performed at [facility], [address] [place], [place] [address]    Culture PENDING  Incomplete   Report Status PENDING  Incomplete  Culture, blood (routine x 2)     Status: None (Preliminary result)   Collection Time: 08/07/24  9:07 PM   Specimen: BLOOD  Result Value Ref Range Status   Specimen Description BLOOD BLOOD LEFT ARM  Final   Special Requests   Final    BOTTLES DRAWN AEROBIC AND ANAEROBIC Blood Culture adequate volume Performed at [facility], [address] [place], [place] [address]    Culture PENDING  Incomplete   Report Status PENDING  Incomplete     Radiological Exams on Admission: CT ABDOMEN PELVIS W CONTRAST Result Date: 08/07/2024 CLINICAL DATA:  Right lower quadrant pain with fever. EXAM: CT ABDOMEN AND PELVIS WITH CONTRAST TECHNIQUE: Multidetector CT imaging of the abdomen and pelvis was performed using the standard protocol following bolus administration of intravenous contrast. RADIATION DOSE REDUCTION: This exam was performed according to the departmental dose-optimization program which includes automated exposure control, adjustment of the mA and/or kV according to patient size and/or use of iterative reconstruction technique. CONTRAST:  OMNIPAQUE  IOHEXOL  300 MG/ML  SOLN COMPARISON:  CT abdomen and pelvis 07/06/2024. FINDINGS: Lower chest: No acute abnormality. Hepatobiliary: Gallbladder is absent. There is no biliary ductal dilatation. There are numerous cysts scattered throughout the liver and hypodensities which are too small to characterize measuring up to 2 cm, unchanged from prior. Pancreas: Unremarkable. No pancreatic ductal  dilatation or surrounding inflammatory changes. Spleen: Normal in size without focal abnormality. Adrenals/Urinary Tract: Adrenal glands are unremarkable. Kidneys are normal, without renal calculi, focal lesion, or hydronephrosis. Bladder is unremarkable. Stomach/Bowel: Stomach is within normal limits. The appendix is not seen. There is a large amount of stool throughout the entire colon. No evidence of bowel wall thickening, distention, or inflammatory changes. Vascular/Lymphatic: Aortic atherosclerosis. No enlarged abdominal or pelvic lymph nodes. Reproductive: Prostate gland is enlarged measuring 5.5 cm in transverse dimension. There is a hypodense nodular area in the prostate on left measuring 11 mm. Other:  There is a small left inguinal hernia containing nondilated bowel. There is no ascites. Musculoskeletal: There are degenerative changes of the lower lumbar spine. IMPRESSION: 1. No acute localizing process in the abdomen or pelvis. The appendix is not definitely visualized. 2. Large amount of stool throughout the colon. 3. Small left inguinal hernia containing nondilated bowel. 4. Prostatomegaly with 11 mm hypodense nodule in the left. Correlate with PSA. Aortic Atherosclerosis (ICD10-I70.0). Electronically Signed   By: Greig Pique M.D.   On: 08/07/2024 22:53   DG Chest Portable 1 View Result Date: 08/07/2024 CLINICAL DATA:  Sepsis EXAM: PORTABLE CHEST 1 VIEW COMPARISON:  None Available. FINDINGS: The heart size and mediastinal contours are within normal limits. Both lungs are clear. The visualized skeletal structures are unremarkable. IMPRESSION: No active disease. Electronically Signed   By: Dorethia Molt M.D.   On: 08/07/2024 21:55    Assessment/Plan   1. UTI; recent hx of ESBL UTI  - Treat with meropenem  given high fever and recent ESBL infection, follow cultures and clinical course    2. Prostatomegaly; prostate nodule  - Continue Flomax , follow-up with urology outpatient    3.  Hypokalemia  - Replacing    4. HLD  - Lipitor     DVT prophylaxis: Lovenox   Code Status: Full  Level of Care: Level of care: Med-Surg Family Communication: Daughter at bedside  Disposition Plan:  Patient is from: Home  Anticipated d/c is to: Home  Anticipated d/c date is: 08/11/24 Patient currently: Pending cultures, clinical improvement  Consults called: none  Admission status: Observation (order for observation was placed prior to UA resulting; he will likely require >2 midnights)    Evalene GORMAN Sprinkles, MD [facility]  08/08/2024, 12:07 AM

## 2024-08-08 NOTE — Progress Notes (Signed)
   08/08/24 2000  Provider Notification  Provider Name/Title Dr. Charlton  Date Provider Notified 08/08/24  Time Provider Notified 2008  Method of Notification Page  Notification Reason Other (Comment)  Test performed and critical result blood cultures  Date Critical Result Received 08/08/24  Time Critical Result Received 2000  Provider response No new orders (current ABX is appropriate)  Date of Provider Response 08/08/24  Time of Provider Response 2007

## 2024-08-08 NOTE — Progress Notes (Signed)
 Per Jasmine in the lab, aerobic culture bottle positive for gram negative rods. MD Barbarann notified.

## 2024-08-08 NOTE — Hospital Course (Addendum)
 73yo with h/o HLD, BPH, and ESBL UTI in 06/2024 who presented with RLQ abdominal pan and sweats.  SIRS physiology with fever, tachycardia, tachypnea, leukocytosis. UA suggestive of UTI, + bacteremia with GNR.  Sepsis ruled in.  Treated with Zosyn  -> meropenem .

## 2024-08-08 NOTE — Plan of Care (Signed)
  Problem: Clinical Measurements: Goal: Ability to maintain clinical measurements within normal limits will improve Outcome: Progressing   Problem: Clinical Measurements: Goal: Ability to maintain clinical measurements within normal limits will improve Outcome: Progressing Goal: Will remain free from infection Outcome: Progressing Goal: Diagnostic test results will improve Outcome: Progressing Goal: Respiratory complications will improve Outcome: Progressing Goal: Cardiovascular complication will be avoided Outcome: Progressing   Problem: Coping: Goal: Level of anxiety will decrease Outcome: Progressing   Problem: Pain Managment: Goal: General experience of comfort will improve and/or be controlled Outcome: Progressing   Problem: Elimination: Goal: Will not experience complications related to bowel motility Outcome: Progressing Goal: Will not experience complications related to urinary retention Outcome: Progressing

## 2024-08-08 NOTE — Evaluation (Signed)
 Occupational Therapy Evaluation Patient Details Name: Daniel Duffy MRN: [id number] DOB: 1951-05-26 Today's Date: 08/08/2024   History of Present Illness   Philbert Ocallaghan is a 73 y.o. male with medical history significant for hyperlipidemia, enlarged prostate, and UTI due to ESBL E. coli in July 2025 who now presents with right lower quadrant abdominal pain and sweats.     Patient reports developing diaphoresis yesterday and then right lower quadrant abdominal pain today.  He denies any associated dysuria, nausea, vomiting, chest pain, or shortness of breath. (per MD)     Clinical Impressions Pt agreeable to OT evaluation. Pt appears to be at or near baseline function for ADL's and functional mobility. Pt able to ambulate to the toilet without assist and without AD. 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 Pt is independent at baseline and seems to be at that level currently. Pt left in the bed with call bell within reach. Pt is not recommended for further acute OT services and will be discharged to care of nursing staff for remaining length of stay.               Functional Status Assessment   Patient has not had a recent decline in their functional status     Equipment Recommendations   None recommended by OT            Precautions/Restrictions   Precautions Precautions: Fall Recall of Precautions/Restrictions: Intact Restrictions Weight Bearing Restrictions Per Provider Order: No     Mobility Bed Mobility Overal bed mobility: Independent                  Transfers Overall transfer level: Independent                 General transfer comment: Able to ambulate to the toilet and back to bed without difficulty.      Balance Overall balance assessment: Mild deficits observed, not formally tested                                         ADL either performed or assessed with clinical judgement    ADL Overall ADL's : Independent                                       General ADL Comments: No issues with socks. No apparent issues with other ADL's.     Vision Baseline Vision/History: 1 Wears glasses Ability to See in Adequate Light: 0 Adequate Patient Visual Report: No change from baseline Vision Assessment?: No apparent visual deficits;Wears glasses for reading     Perception Perception: Not tested       Praxis Praxis: Not tested       Pertinent Vitals/Pain Pain Assessment Pain Assessment: No/denies pain     Extremity/Trunk Assessment Upper Extremity Assessment Upper Extremity Assessment: Overall WFL for tasks assessed (Baseline paralysis in hands from falling off roof leading to a cervical injury several years ago. Pt also missing digits on R and L hands)   Lower Extremity Assessment Lower Extremity Assessment: Defer to PT evaluation   Cervical / Trunk Assessment Cervical / Trunk Assessment: Normal   Communication Communication Communication: No apparent difficulties   Cognition Arousal: Alert Behavior During Therapy: WFL for tasks assessed/performed Cognition: No apparent impairments  Following commands: Intact       Cueing  General Comments   Cueing Techniques: Verbal cues                 Home Living Family/patient expects to be discharged to:: Private residence Living Arrangements: Alone Available Help at Discharge: Family;Available PRN/intermittently Type of Home: House Home Access: Stairs to enter [employer] of Steps: 4 Entrance Stairs-Rails: Right;Left;Can reach both Home Layout: One level     Bathroom Shower/Tub: Chief Strategy Officer: Standard Bathroom Accessibility: Yes How Accessible: Accessible via wheelchair;Accessible via walker Home Equipment: None          Prior Functioning/Environment Prior Level of Function : Independent/Modified  Independent             Mobility Comments: Ambulates without AD; drives ADLs Comments: Independent                End of Session    Activity Tolerance: Patient tolerated treatment well Patient left: in bed;with call bell/phone within reach  OT Visit Diagnosis: Unsteadiness on feet (R26.81)                Time: 8463-8453 OT Time Calculation (min): 10 min Charges:  OT General Charges $OT Visit: 1 Visit OT Evaluation $OT Eval Low Complexity: 1 Low  [facility] OT, MOT   Jayson Person 08/08/2024, 3:55 PM

## 2024-08-08 NOTE — Progress Notes (Signed)
   08/08/24 0934  TOC Brief Assessment  Insurance and Status Reviewed  Patient has primary care physician Yes  Home environment has been reviewed Single family home  Prior level of function: Independent  Prior/Current Home Services No current home services  Social Drivers of Health Review SDOH reviewed no interventions necessary  Readmission risk has been reviewed Yes  Transition of care needs no transition of care needs at this time     Transition of Care Department [facility]) has reviewed patient and no TOC needs have been identified at this time. We will continue to monitor patient advancement through interdisciplinary progression rounds. If new patient transition needs arise, please place a TOC consult.

## 2024-08-08 NOTE — Progress Notes (Signed)
 Progress Note   Patient: Daniel Duffy FMW:[id number] DOB: 1951-05-22 DOA: 08/07/2024     0 DOS: the patient was seen and examined on 08/08/2024   Brief hospital course: 73yo with h/o HLD, BPH, and ESBL UTI in 06/2024 who presented with RLQ abdominal pan and sweats.  SIRS physiology with fever, tachycardia, tachypnea, leukocytosis. UA suggestive of UTI, + bacteremia with GNR.  Sepsis ruled in.  Treated with Zosyn  -> meropenem .  Assessment and Plan:  Sepsis due to UTI with GNR bacteremia Recent hx of ESBL UTI  Presented with fever and other SIRS criteria UA suggestive of UTI Blood cultures + for GNR Treat with meropenem  given recent ESBL infection Looks significantly better today     Prostatomegaly; prostate nodule  Continue Flomax  Follow-up with urology outpatient   He did see urology recently   Hypokalemia  Replacing   Recheck BMP in AM   HLD  Continue Lipitor    Glaucoma Continue brimonidine  and latanoprost     Consultants: PT OT  Procedures: None  Antibiotics: Meropenem  9/2-  30 Day Unplanned Readmission Risk Score    Flowsheet Row ED to Hosp-Admission (Discharged) from 07/06/2024 in [facility]  30 Day Unplanned Readmission Risk Score (%) 9.73 Filed at 07/09/2024 0801    This score is the patient's risk of an unplanned readmission within 30 days of being discharged (0 -100%). The score is based on dignosis, age, lab data, medications, orders, and past utilization.   Low:  0-14.9   Medium: 15-21.9   High: 22-29.9   Extreme: 30 and above           Subjective: Feeling significantly better today.  No specific complaints.  Happy to do whatever is recommended.  Has not been OOB.   Objective: Vitals:   08/08/24 1110 08/08/24 1504  BP: 92/65 100/64  Pulse: 72 76  Resp: 17 16  Temp: 98.2 F (36.8 C) 99.1 F (37.3 C)  SpO2: 93% 98%    Intake/Output Summary (Last 24 hours) at 08/08/2024 1531 Last data filed at 08/08/2024 1300 Gross per  24 hour  Intake 480 ml  Output 400 ml  Net 80 ml   Filed Weights   08/07/24 2100 08/08/24 0139  Weight: 70.3 kg 68.9 kg    Exam:  General:  Appears calm and comfortable and is in NAD Eyes:  normal lids, iris ENT:  grossly normal hearing, lips & tongue, mmm Cardiovascular:  RRR. No LE edema.  Respiratory:   CTA bilaterally with no wheezes/rales/rhonchi.  Normal respiratory effort. Abdomen:  soft, NT, ND, no CVAT Skin:  no rash or induration seen on limited exam Musculoskeletal:  grossly normal tone BUE/BLE, good ROM, no bony abnormality Psychiatric:  grossly normal mood and affect, speech fluent and appropriate, AOx3 Neurologic:  CN 2-12 grossly intact, moves all extremities in coordinated fashion  Data Reviewed: I have reviewed the patient's lab results since admission.  Pertinent labs for today include:   K+ 3.4 Glucose 163 WBC 11.5 Hgb 10.4 UA: moderate Hgb, large LE, + nitrite, 30 protein, rare bacteria Urine culture pending  Blood cultures pending Urine culture from 7/31 with ESBL E coli sensitive to Zosyn , Meropenem    Family Communication: None present; I spoke with his daughter Daniel Duffy by telephone  Mobility: PT/OT Consulted      Code Status: Full Code  Barriers to discharge:    Disposition: Status is: Inpatient Admit - It is my clinical opinion that admission to INPATIENT is reasonable and necessary because of  the expectation that this patient will require hospital care that crosses at least 2 midnights to treat this condition based on the medical complexity of the problems presented.  Given the aforementioned information, the predictability of an adverse outcome is felt to be significant.      Time spent: 50 minutes  Unresulted Labs (From admission, onward)     Start     Ordered   08/08/24 0500  Basic metabolic panel  Daily,   R      08/08/24 0007   08/08/24 0500  CBC  Daily,   R      08/08/24 0007   08/08/24 0007  Urine Culture (for pregnant,  neutropenic or urologic patients or patients with an indwelling urinary catheter)  (Urine Culture)  Add-on,   AD       Question:  Indication  Answer:  Flank Pain   08/08/24 0007             Author: Delon Herald, MD 08/08/2024 3:31 PM  For on call review [email or web address].

## 2024-08-09 DIAGNOSIS — A419 Sepsis, unspecified organism: Secondary | ICD-10-CM | POA: Diagnosis not present

## 2024-08-09 DIAGNOSIS — N390 Urinary tract infection, site not specified: Secondary | ICD-10-CM | POA: Diagnosis not present

## 2024-08-09 LAB — URINE CULTURE: Culture: <10000 — AB

## 2024-08-09 LAB — BASIC METABOLIC PANEL WITH GFR
Anion gap: 5 (ref 5–15)
BUN: 14 mg/dL (ref 8–23)
CO2: 26 mmol/L (ref 22–32)
Calcium: 10.2 mg/dL (ref 8.9–10.3)
Chloride: 107 mmol/L (ref 98–111)
Creatinine, Ser: 1.19 mg/dL (ref 0.61–1.24)
GFR, Estimated: >60 mL/min (ref >60)
Glucose, Bld: 134 mg/dL — ABNORMAL HIGH (ref 70–99)
Potassium: 3.9 mmol/L (ref 3.5–5.1)
Sodium: 138 mmol/L (ref 135–145)

## 2024-08-09 LAB — CBC
HCT: 35 % — ABNORMAL LOW (ref 39.0–52.0)
Hemoglobin: 11.4 g/dL — ABNORMAL LOW (ref 13.0–17.0)
MCH: 28.2 pg (ref 26.0–34.0)
MCHC: 32.6 g/dL (ref 30.0–36.0)
MCV: 86.6 fL (ref 80.0–100.0)
Platelets: 166 K/uL (ref 150–400)
RBC: 4.04 MIL/uL — ABNORMAL LOW (ref 4.22–5.81)
RDW: 16.1 % — ABNORMAL HIGH (ref 11.5–15.5)
WBC: 9.8 K/uL (ref 4.0–10.5)
nRBC: 0 % (ref 0.0–0.2)

## 2024-08-09 NOTE — Progress Notes (Addendum)
 PROGRESS NOTE    Daniel Duffy  FMW:[id number] DOB: [birth year] DOA: 08/07/2024 PCP: [facility]     Brief Narrative:  [name] is a 73yo with h/o HLD, BPH, and ESBL UTI in 06/2024 who presented with RLQ abdominal pan and sweats.  SIRS physiology with fever, tachycardia, tachypnea, leukocytosis. UA suggestive of UTI, + bacteremia with GNR.  Sepsis ruled in.  Treated with Zosyn  -> meropenem .  New events last 24 hours / Subjective: Patient in good spirits this morning, was able to ambulate well with physical therapy.  Had fever 100.5 overnight.   Assessment & Plan:   Principal Problem:   Sepsis secondary to UTI [facility]) Active Problems:   Prostate nodule   History of ESBL E. coli infection   Hypokalemia   Bacteremia   Sepsis secondary to ESBL bacteremia - Recent history of ESBL UTI 07/06/2024 - Urine culture 9/1 with insignificant growth - Blood culture 9/1 showed E. coli, Enterobacterales, ESBL - Remains on IV Merrem  - Will discuss with infectious disease for antibiotic regimen recommendation --> continue merrem  for now, sensitivity still pending and likely to be urinary source. The urine culture was obtained after dose of zosyn    BPH - Flomax   Hyperlipidemia - Lipitor   DVT prophylaxis:  enoxaparin  (LOVENOX ) injection 40 mg Start: 08/08/24 1000  Code Status: Full code Family Communication: None at bedside Disposition Plan: Home Status is: Inpatient Remains inpatient appropriate because: IV antibiotics    Antimicrobials:  Anti-infectives (From admission, onward)    Start     Dose/Rate Route Frequency Ordered Stop   08/08/24 0200  meropenem  (MERREM ) 1 g in sodium chloride  0.9 % 100 mL IVPB        1 g 200 mL/hr over 30 Minutes Intravenous Every 8 hours 08/08/24 0018     08/07/24 2200  piperacillin -tazobactam (ZOSYN ) IVPB 3.375 g  Status:  Discontinued        3.375 g 100 mL/hr over 30 Minutes Intravenous Every 8 hours 08/07/24 2053 08/07/24  2126   08/07/24 2200  piperacillin -tazobactam (ZOSYN ) IVPB 3.375 g  Status:  Discontinued        3.375 g 12.5 mL/hr over 240 Minutes Intravenous Every 8 hours 08/07/24 2126 08/08/24 0007        Objective: Vitals:   08/08/24 1110 08/08/24 1504 08/08/24 2047 08/09/24 0458  BP: 92/65 100/64 126/80 129/74  Pulse: 72 76 86 88  Resp: 17 16 20 16   Temp: 98.2 F (36.8 C) 99.1 F (37.3 C) 99.3 F (37.4 C) (!) 100.5 F (38.1 C)  TempSrc: Oral Oral Oral Oral  SpO2: 93% 98% 97% 97%  Weight:      Height:        Intake/Output Summary (Last 24 hours) at 08/09/2024 1039 Last data filed at 08/09/2024 0400 Gross per 24 hour  Intake 650.79 ml  Output 700 ml  Net -49.21 ml   Filed Weights   08/07/24 2100 08/08/24 0139  Weight: 70.3 kg 68.9 kg    Examination:  General exam: Appears calm and comfortable  Respiratory system: Clear to auscultation. Respiratory effort normal. No respiratory distress. No conversational dyspnea.  Cardiovascular system: S1 & S2 heard, RRR. No murmurs. No pedal edema. Gastrointestinal system: Abdomen is nondistended, soft and nontender. Normal bowel sounds heard. Central nervous system: Alert and oriented. No focal neurological deficits. Speech clear.  Psychiatry: Judgement and insight appear normal. Mood & affect appropriate.   Data Reviewed: I have personally reviewed following labs and imaging studies  CBC:  Recent Labs  Lab 08/07/24 2107 08/08/24 0444 08/09/24 0420  WBC 11.0* 11.5* 9.8  HGB 11.7* 10.4* 11.4*  HCT 35.8* 32.0* 35.0*  MCV 85.9 86.3 86.6  PLT 168 144* 166   Basic Metabolic Panel: Recent Labs  Lab 08/07/24 2107 08/08/24 0444 08/09/24 0420  NA 136 138 138  K 3.3* 3.4* 3.9  CL 102 103 107  CO2 25 25 26   GLUCOSE 135* 163* 134*  BUN 16 16 14   CREATININE 1.32* 1.23 1.19  CALCIUM  10.3 10.2 10.2  MG  --  1.7  --    GFR: Estimated Creatinine Clearance: 55.5 mL/min (by C-G formula based on SCr of 1.19 mg/dL). Liver Function  Tests: Recent Labs  Lab 08/07/24 2107  AST 36  ALT 25  ALKPHOS 71  BILITOT 0.7  PROT 6.9  ALBUMIN 3.3*   Recent Labs  Lab 08/07/24 2107  LIPASE 28   No results for input(s): AMMONIA in the last 168 hours. Coagulation Profile: No results for input(s): INR, PROTIME in the last 168 hours. Cardiac Enzymes: No results for input(s): CKTOTAL, CKMB, CKMBINDEX, TROPONINI in the last 168 hours. BNP (last 3 results) No results for input(s): PROBNP in the last 8760 hours. HbA1C: No results for input(s): HGBA1C in the last 72 hours. CBG: No results for input(s): GLUCAP in the last 168 hours. Lipid Profile: No results for input(s): CHOL, HDL, LDLCALC, TRIG, CHOLHDL, LDLDIRECT in the last 72 hours. Thyroid  Function Tests: No results for input(s): TSH, T4TOTAL, FREET4, T3FREE, THYROIDAB in the last 72 hours. Anemia Panel: No results for input(s): VITAMINB12, FOLATE, FERRITIN, TIBC, IRON, RETICCTPCT in the last 72 hours. Sepsis Labs: Recent Labs  Lab 08/07/24 2107  LATICACIDVEN 1.4    Recent Results (from the past 240 hours)  Culture, blood (routine x 2)     Status: Abnormal (Preliminary result)   Collection Time: 08/07/24  9:07 PM   Specimen: BLOOD  Result Value Ref Range Status   Specimen Description   Final    BLOOD LEFT ANTECUBITAL Performed at [facility], [address] [place], [place] [address]    Special Requests   Final    BOTTLES DRAWN AEROBIC AND ANAEROBIC Blood Culture adequate volume Performed at [facility], [address] [place], [place] [address]    Culture  Setup Time   Final    IN BOTH AEROBIC AND ANAEROBIC BOTTLES GRAM NEGATIVE RODS Gram Stain Report Called to,Read Back By and Verified With: M HOWERTON AT 0958 ON 09.02.25 BY ADGER J  CRITICAL RESULT CALLED TO, READ BACK BY AND VERIFIED WITH: RN CANDIE GULLY [phone] @ 1958 FH    Culture (A)  Final    ESCHERICHIA COLI SUSCEPTIBILITIES TO FOLLOW Performed  at [facility], [address] [place], [place] [address]    Report Status PENDING  Incomplete  Culture, blood (routine x 2)     Status: None (Preliminary result)   Collection Time: 08/07/24  9:07 PM   Specimen: BLOOD LEFT ARM  Result Value Ref Range Status   Specimen Description   Final    BLOOD LEFT ARM Performed at [facility], [address], [place], [place] [address]    Special Requests   Final    BOTTLES DRAWN AEROBIC AND ANAEROBIC Blood Culture adequate volume Performed at [facility], [address], [place], [place] [address]    Culture  Setup Time   Final    IN BOTH AEROBIC AND ANAEROBIC BOTTLES GRAM NEGATIVE RODS Gram  Stain Report Called to,Read Back By and Verified With: W.EARLY AT 0916 ON 09.02.25 BY ADGER J  GRAM STAIN REVIEWED-AGREE WITH RESULT DRT Performed at [facility], [address], [place], [place] [address]    Culture GRAM NEGATIVE RODS  Final   Report Status PENDING  Incomplete  Blood Culture ID Panel (Reflexed)     Status: Abnormal   Collection Time: 08/07/24  9:07 PM  Result Value Ref Range Status   Enterococcus faecalis NOT DETECTED NOT DETECTED Final   Enterococcus Faecium NOT DETECTED NOT DETECTED Final   Listeria monocytogenes NOT DETECTED NOT DETECTED Final   Staphylococcus species NOT DETECTED NOT DETECTED Final   Staphylococcus aureus (BCID) NOT DETECTED NOT DETECTED Final   Staphylococcus epidermidis NOT DETECTED NOT DETECTED Final   Staphylococcus lugdunensis NOT DETECTED NOT DETECTED Final   Streptococcus species NOT DETECTED NOT DETECTED Final   Streptococcus agalactiae NOT DETECTED NOT DETECTED Final   Streptococcus pneumoniae NOT DETECTED NOT DETECTED Final   Streptococcus pyogenes NOT DETECTED NOT DETECTED Final   A.calcoaceticus-baumannii NOT DETECTED NOT DETECTED Final   Bacteroides fragilis NOT DETECTED NOT DETECTED Final   Enterobacterales DETECTED (A) NOT DETECTED Final    Comment: Enterobacterales represent  a large order of gram negative bacteria, not a single organism. CRITICAL RESULT CALLED TO, READ BACK BY AND VERIFIED WITH: RN S. DESIDERIO [phone] @ 1958 FH    Enterobacter cloacae complex NOT DETECTED NOT DETECTED Final   Escherichia coli DETECTED (A) NOT DETECTED Final    Comment: CRITICAL RESULT CALLED TO, READ BACK BY AND VERIFIED WITH: RN SSABRA DESIDERIO [phone] @ 1958 FH    Klebsiella aerogenes NOT DETECTED NOT DETECTED Final   Klebsiella oxytoca NOT DETECTED NOT DETECTED Final   Klebsiella pneumoniae NOT DETECTED NOT DETECTED Final   Proteus species NOT DETECTED NOT DETECTED Final   Salmonella species NOT DETECTED NOT DETECTED Final   Serratia marcescens NOT DETECTED NOT DETECTED Final   Haemophilus influenzae NOT DETECTED NOT DETECTED Final   Neisseria meningitidis NOT DETECTED NOT DETECTED Final   Pseudomonas aeruginosa NOT DETECTED NOT DETECTED Final   Stenotrophomonas maltophilia NOT DETECTED NOT DETECTED Final   Candida albicans NOT DETECTED NOT DETECTED Final   Candida auris NOT DETECTED NOT DETECTED Final   Candida glabrata NOT DETECTED NOT DETECTED Final   Candida krusei NOT DETECTED NOT DETECTED Final   Candida parapsilosis NOT DETECTED NOT DETECTED Final   Candida tropicalis NOT DETECTED NOT DETECTED Final   Cryptococcus neoformans/gattii NOT DETECTED NOT DETECTED Final   CTX-M ESBL DETECTED (A) NOT DETECTED Final    Comment: CRITICAL RESULT CALLED TO, READ BACK BY AND VERIFIED WITH: RN CANDIE DESIDERIO [phone] @ 1958 FH (NOTE) Extended spectrum beta-lactamase detected. Recommend a carbapenem as initial therapy.      Carbapenem resistance IMP NOT DETECTED NOT DETECTED Final   Carbapenem resistance KPC NOT DETECTED NOT DETECTED Final   Carbapenem resistance NDM NOT DETECTED NOT DETECTED Final   Carbapenem resist OXA 48 LIKE NOT DETECTED NOT DETECTED Final   Carbapenem resistance VIM NOT DETECTED NOT DETECTED Final    Comment: Performed at [facility], [address]  [place], [place] [address]  Urine Culture (for pregnant, neutropenic or urologic patients or patients with an indwelling urinary catheter)     Status: Abnormal   Collection Time: 08/07/24 11:05 PM   Specimen: Urine, Clean Catch  Result Value Ref Range Status   Specimen Description   Final    URINE,  CLEAN CATCH Performed at [facility], [address] [place], [place] [address]    Special Requests   Final    NONE Performed at [facility], [address], [place], [place] [address]    Culture (A)  Final    <10,000 COLONIES/mL INSIGNIFICANT GROWTH Performed at [facility], [address], [place], [place] [address]    Report Status 08/09/2024 FINAL  Final      Radiology Studies: CT ABDOMEN PELVIS W CONTRAST Result Date: 08/07/2024 CLINICAL DATA:  Right lower quadrant pain with fever. EXAM: CT ABDOMEN AND PELVIS WITH CONTRAST TECHNIQUE: Multidetector CT imaging of the abdomen and pelvis was performed using the standard protocol following bolus administration of intravenous contrast. RADIATION DOSE REDUCTION: This exam was performed according to the departmental dose-optimization program which includes automated exposure control, adjustment of the mA and/or kV according to patient size and/or use of iterative reconstruction technique. CONTRAST:  OMNIPAQUE  IOHEXOL  300 MG/ML  SOLN COMPARISON:  CT abdomen and pelvis 07/06/2024. FINDINGS: Lower chest: No acute abnormality. Hepatobiliary: Gallbladder is absent. There is no biliary ductal dilatation. There are numerous cysts scattered throughout the liver and hypodensities which are too small to characterize measuring up to 2 cm, unchanged from prior. Pancreas: Unremarkable. No pancreatic ductal dilatation or surrounding inflammatory changes. Spleen: Normal in size without focal abnormality. Adrenals/Urinary Tract: Adrenal glands are unremarkable. Kidneys are normal, without renal calculi, focal lesion, or hydronephrosis. Bladder is unremarkable.  Stomach/Bowel: Stomach is within normal limits. The appendix is not seen. There is a large amount of stool throughout the entire colon. No evidence of bowel wall thickening, distention, or inflammatory changes. Vascular/Lymphatic: Aortic atherosclerosis. No enlarged abdominal or pelvic lymph nodes. Reproductive: Prostate gland is enlarged measuring 5.5 cm in transverse dimension. There is a hypodense nodular area in the prostate on left measuring 11 mm. Other: There is a small left inguinal hernia containing nondilated bowel. There is no ascites. Musculoskeletal: There are degenerative changes of the lower lumbar spine. IMPRESSION: 1. No acute localizing process in the abdomen or pelvis. The appendix is not definitely visualized. 2. Large amount of stool throughout the colon. 3. Small left inguinal hernia containing nondilated bowel. 4. Prostatomegaly with 11 mm hypodense nodule in the left. Correlate with PSA. Aortic Atherosclerosis (ICD10-I70.0). Electronically Signed   By: Greig Pique M.D.   On: 08/07/2024 22:53   DG Chest Portable 1 View Result Date: 08/07/2024 CLINICAL DATA:  Sepsis EXAM: PORTABLE CHEST 1 VIEW COMPARISON:  None Available. FINDINGS: The heart size and mediastinal contours are within normal limits. Both lungs are clear. The visualized skeletal structures are unremarkable. IMPRESSION: No active disease. Electronically Signed   By: Dorethia Molt M.D.   On: 08/07/2024 21:55      Scheduled Meds:  atorvastatin   40 mg Oral QHS   brimonidine   1 drop Both Eyes BID   enoxaparin  (LOVENOX ) injection  40 mg Subcutaneous Q24H   latanoprost   1 drop Both Eyes QHS   pantoprazole   40 mg Oral Daily   senna  1 tablet Oral BID   tamsulosin   0.4 mg Oral Daily   Continuous Infusions:  meropenem  (MERREM ) IV 1 g (08/09/24 0940)     LOS: 1 day   Time spent: 25 minutes   Delon Hoe, DO [facility] 08/09/2024, 10:39 AM   Available via Epic secure chat 7am-7pm After these hours,  please refer to coverage provider listed on [email or web address]

## 2024-08-09 NOTE — Evaluation (Signed)
 Physical Therapy Evaluation Patient Details Name: Daniel Duffy MRN: [id number] DOB: December 07, 1951 Today's Date: 08/09/2024  History of Present Illness  Daniel Duffy is a 73 y.o. male with medical history significant for hyperlipidemia, enlarged prostate, and UTI due to ESBL E. coli in July 2025 who now presents with right lower quadrant abdominal pain and sweats.     Patient reports developing diaphoresis yesterday and then right lower quadrant abdominal pain today.  He denies any associated dysuria, nausea, vomiting, chest pain, or shortness of breath.   Clinical Impression  Patient functioning at baseline for functional mobility and gait demonstrating mild baseline deficits ambulating in room, hallways without loss of balance or need for an AD. Plan:  Patient discharged from physical therapy to care of nursing for ambulation daily as tolerated for length of stay.          If plan is discharge home, recommend the following: Other (comment) (Patient at baseline)   Can travel by private vehicle        Equipment Recommendations None recommended by PT  Recommendations for Other Services       Functional Status Assessment Patient has not had a recent decline in their functional status     Precautions / Restrictions Precautions Precautions: Fall Recall of Precautions/Restrictions: Intact Restrictions Weight Bearing Restrictions Per Provider Order: No      Mobility  Bed Mobility Overal bed mobility: Independent                  Transfers Overall transfer level: Independent                      Ambulation/Gait Ambulation/Gait assistance: Modified independent (Device/Increase time) Gait Distance (Feet): 150 Feet Assistive device: None Gait Pattern/deviations: Decreased step length - right, Decreased step length - left, Decreased stride length Gait velocity: decreased     General Gait Details: grossly WFL with mild baseline deficits with good return for  ambulating in room, hallways without loss of balance or need for an AD  Stairs            Wheelchair Mobility     Tilt Bed    Modified Rankin (Stroke Patients Only)       Balance Overall balance assessment: Mild deficits observed, not formally tested                                           Pertinent Vitals/Pain Pain Assessment Pain Assessment: No/denies pain    Home Living Family/patient expects to be discharged to:: Private residence Living Arrangements: Alone Available Help at Discharge: Family;Available PRN/intermittently Type of Home: House Home Access: Stairs to enter Entrance Stairs-Rails: Right;Left;Can reach both Entrance Stairs-Number of Steps: 4   Home Layout: One level Home Equipment: None      Prior Function Prior Level of Function : Independent/Modified Independent             Mobility Comments: Ambulates without AD; drives ADLs Comments: Independent     Extremity/Trunk Assessment   Upper Extremity Assessment Upper Extremity Assessment: Defer to OT evaluation    Lower Extremity Assessment Lower Extremity Assessment: Overall WFL for tasks assessed    Cervical / Trunk Assessment Cervical / Trunk Assessment: Normal  Communication   Communication Communication: No apparent difficulties    Cognition Arousal: Alert Behavior During Therapy: WFL for tasks assessed/performed   PT - Cognitive impairments:  No apparent impairments                         Following commands: Intact       Cueing Cueing Techniques: Verbal cues     General Comments      Exercises     Assessment/Plan    PT Assessment Patient does not need any further PT services  PT Problem List         PT Treatment Interventions      PT Goals (Current goals can be found in the Care Plan section)  Acute Rehab PT Goals Patient Stated Goal: return home with family to assist PT Goal Formulation: With patient Time For Goal  Achievement: 08/09/24 Potential to Achieve Goals: Good    Frequency       Co-evaluation               AM-PAC PT 6 Clicks Mobility  Outcome Measure Help needed turning from your back to your side while in a flat bed without using bedrails?: None Help needed moving from lying on your back to sitting on the side of a flat bed without using bedrails?: None Help needed moving to and from a bed to a chair (including a wheelchair)?: None Help needed standing up from a chair using your arms (e.g., wheelchair or bedside chair)?: None Help needed to walk in hospital room?: None Help needed climbing 3-5 steps with a railing? : A Little 6 Click Score: 23    End of Session   Activity Tolerance: Patient tolerated treatment well Patient left: in bed;with call bell/phone within reach Nurse Communication: Mobility status PT Visit Diagnosis: Unsteadiness on feet (R26.81);Other abnormalities of gait and mobility (R26.89);Muscle weakness (generalized) (M62.81)    Time: 9161-9152 PT Time Calculation (min) (ACUTE ONLY): 9 min   Charges:   PT Evaluation $PT Eval Low Complexity: 1 Low PT Treatments $Gait Training: 8-22 mins PT General Charges $$ ACUTE PT VISIT: 1 Visit         11:31 AM, 08/09/24 Lynwood Music, MPT Physical Therapist with [facility] [phone] office [phone] mobile phone

## 2024-08-09 NOTE — Progress Notes (Signed)
 Mobility Specialist Progress Note:    08/09/24 1300  Mobility  Activity Ambulated with assistance  Level of Assistance Modified independent, requires aide device or extra time  Assistive Device None  Distance Ambulated (ft) 400 ft  Range of Motion/Exercises Active;All extremities  Activity Response Tolerated well  Mobility Referral Yes  Mobility visit 1 Mobility  Mobility Specialist Start Time (ACUTE ONLY) 1300  Mobility Specialist Stop Time (ACUTE ONLY) 1320  Mobility Specialist Time Calculation (min) (ACUTE ONLY) 20 min   Pt received in bed, agreeable to mobility. ModI to stand and ambulate with no AD. Tolerated well,asx throughout. Returned supine, all needs met.  Daniel Duffy Mobility Specialist Please contact via Special educational needs teacher or  Rehab office at [phone]

## 2024-08-09 NOTE — Plan of Care (Signed)
  Problem: Education: Goal: Knowledge of General Education information will improve Description: Including pain rating scale, medication(s)/side effects and non-pharmacologic comfort measures Outcome: Progressing   Problem: Health Behavior/Discharge Planning: Goal: Ability to manage health-related needs will improve Outcome: Progressing   Problem: Clinical Measurements: Goal: Diagnostic test results will improve Outcome: Progressing   Problem: Clinical Measurements: Goal: Respiratory complications will improve Outcome: Adequate for Discharge Goal: Cardiovascular complication will be avoided Outcome: Adequate for Discharge   Problem: Activity: Goal: Risk for activity intolerance will decrease Outcome: Adequate for Discharge

## 2024-08-09 NOTE — Plan of Care (Signed)

## 2024-08-09 NOTE — Progress Notes (Addendum)
 Mobility Specialist Progress Note:    08/09/24 0915  Mobility  Activity Ambulated with assistance  Level of Assistance Modified independent, requires aide device or extra time  Assistive Device None  Distance Ambulated (ft) 400 ft  Range of Motion/Exercises Active;All extremities  Activity Response Tolerated well  Mobility Referral Yes  Mobility visit 1 Mobility  Mobility Specialist Start Time (ACUTE ONLY) 0915  Mobility Specialist Stop Time (ACUTE ONLY) 0935  Mobility Specialist Time Calculation (min) (ACUTE ONLY) 20 min   Pt received in bed, agreeable to mobility. ModI to stand and ambulate with no AD. Tolerated well,asx throughout. Returned supine, all needs met.  Breyon Sigg Mobility Specialist Please contact via Special educational needs teacher or  Rehab office at [phone]

## 2024-08-10 DIAGNOSIS — N390 Urinary tract infection, site not specified: Secondary | ICD-10-CM | POA: Diagnosis not present

## 2024-08-10 DIAGNOSIS — A419 Sepsis, unspecified organism: Secondary | ICD-10-CM | POA: Diagnosis not present

## 2024-08-10 LAB — CULTURE, BLOOD (ROUTINE X 2)
Special Requests: ADEQUATE
Special Requests: ADEQUATE

## 2024-08-10 MED ORDER — TAMSULOSIN HCL 0.4 MG PO CAPS
0.8000 mg | ORAL_CAPSULE | Freq: Every day | ORAL | Status: DC
  Administered 2024-08-11 – 2024-08-14 (×4): 0.8 mg via ORAL
  Filled 2024-08-10 (×4): qty 2

## 2024-08-10 MED ORDER — SODIUM CHLORIDE 0.9 % IV SOLN: INTRAVENOUS | Status: AC | PRN

## 2024-08-10 NOTE — Progress Notes (Addendum)
 PROGRESS NOTE    Daniel Duffy  FMW:[id number] DOB: January 28, 1951 DOA: 08/07/2024 PCP: [facility]     Brief Narrative:  Daniel Duffy is a 73yo with h/o HLD, BPH, and ESBL UTI in 06/2024 who presented with RLQ abdominal pan and sweats.  SIRS physiology with fever, tachycardia, tachypnea, leukocytosis. UA suggestive of UTI, + bacteremia with GNR.  Sepsis ruled in.  Treated with Zosyn  -> meropenem .  New events last 24 hours / Subjective: Reports feeling well. No subjective fevers. No abd pain. Tolerating diet. No dysuria  Assessment & Plan:   Principal Problem:   Sepsis secondary to UTI [facility]) Active Problems:   Prostate nodule   History of ESBL E. coli infection   Hypokalemia   Bacteremia   Sepsis secondary to ESBL bacteremia - Recent history of ESBL UTI 07/06/2024 - Urine culture 9/1 with insignificant growth but had received abx - CT abdomen benign, no signs pyelo, abscess, etc. - Blood culture 9/1 showed E. coli, Enterobacterales, ESBL - Remains on IV Merrem  - per dr. Dennise of ID will need 7 days of penem. TOC engaged today, she will liaise with home health to see if OPAT can be arranged. If not will need to remain hospitalized for those 7 days IV abx - repeat blood cultures today  BPH Reports no issues with urination, no obstruction seen on CT - Flomax , will increase home dose to 0.8 - f/u urology ([facility]) after d/c  Hyperlipidemia - Lipitor   DVT prophylaxis:  enoxaparin  (LOVENOX ) injection 40 mg Start: 08/08/24 1000  Code Status: Full code Family Communication: daughter crystal updated telephonically 9/4 Disposition Plan: Home Status is: Inpatient Remains inpatient appropriate because: IV antibiotics    Antimicrobials:  Anti-infectives (From admission, onward)    Start     Dose/Rate Route Frequency Ordered Stop   08/08/24 0200  meropenem  (MERREM ) 1 g in sodium chloride  0.9 % 100 mL IVPB        1 g 200 mL/hr over 30 Minutes  Intravenous Every 8 hours 08/08/24 0018     08/07/24 2200  piperacillin -tazobactam (ZOSYN ) IVPB 3.375 g  Status:  Discontinued        3.375 g 100 mL/hr over 30 Minutes Intravenous Every 8 hours 08/07/24 2053 08/07/24 2126   08/07/24 2200  piperacillin -tazobactam (ZOSYN ) IVPB 3.375 g  Status:  Discontinued        3.375 g 12.5 mL/hr over 240 Minutes Intravenous Every 8 hours 08/07/24 2126 08/08/24 0007        Objective: Vitals:   08/09/24 1409 08/09/24 2031 08/10/24 0525 08/10/24 1426  BP: 137/77 123/72 128/80 97/65  Pulse: 84 79 87 80  Resp: 17 18 18 17   Temp: 100 F (37.8 C) (!) 100.5 F (38.1 C)  98.6 F (37 C)  TempSrc: Oral Oral  Oral  SpO2: 96% 99% 96% 94%  Weight:      Height:        Intake/Output Summary (Last 24 hours) at 08/10/2024 1507 Last data filed at 08/10/2024 0526 Gross per 24 hour  Intake --  Output 950 ml  Net -950 ml   Filed Weights   08/07/24 2100 08/08/24 0139  Weight: 70.3 kg 68.9 kg    Examination:  General exam: Appears calm and comfortable  Respiratory system: Clear to auscultation. Respiratory effort normal. No respiratory distress. No conversational dyspnea.  Cardiovascular system: S1 & S2 heard, RRR. No murmurs. No pedal edema. Gastrointestinal system: Abdomen is nondistended, soft and nontender. Normal bowel sounds heard.  Central nervous system: Alert and oriented. No focal neurological deficits. Speech clear.  Psychiatry: Judgement and insight appear normal. Mood & affect appropriate.   Data Reviewed: I have personally reviewed following labs and imaging studies  CBC: Recent Labs  Lab 08/07/24 2107 08/08/24 0444 08/09/24 0420  WBC 11.0* 11.5* 9.8  HGB 11.7* 10.4* 11.4*  HCT 35.8* 32.0* 35.0*  MCV 85.9 86.3 86.6  PLT 168 144* 166   Basic Metabolic Panel: Recent Labs  Lab 08/07/24 2107 08/08/24 0444 08/09/24 0420  NA 136 138 138  K 3.3* 3.4* 3.9  CL 102 103 107  CO2 25 25 26   GLUCOSE 135* 163* 134*  BUN 16 16 14    CREATININE 1.32* 1.23 1.19  CALCIUM  10.3 10.2 10.2  MG  --  1.7  --    GFR: Estimated Creatinine Clearance: 55.5 mL/min (by C-G formula based on SCr of 1.19 mg/dL). Liver Function Tests: Recent Labs  Lab 08/07/24 2107  AST 36  ALT 25  ALKPHOS 71  BILITOT 0.7  PROT 6.9  ALBUMIN 3.3*   Recent Labs  Lab 08/07/24 2107  LIPASE 28   No results for input(s): AMMONIA in the last 168 hours. Coagulation Profile: No results for input(s): INR, PROTIME in the last 168 hours. Cardiac Enzymes: No results for input(s): CKTOTAL, CKMB, CKMBINDEX, TROPONINI in the last 168 hours. BNP (last 3 results) No results for input(s): PROBNP in the last 8760 hours. HbA1C: No results for input(s): HGBA1C in the last 72 hours. CBG: No results for input(s): GLUCAP in the last 168 hours. Lipid Profile: No results for input(s): CHOL, HDL, LDLCALC, TRIG, CHOLHDL, LDLDIRECT in the last 72 hours. Thyroid  Function Tests: No results for input(s): TSH, T4TOTAL, FREET4, T3FREE, THYROIDAB in the last 72 hours. Anemia Panel: No results for input(s): VITAMINB12, FOLATE, FERRITIN, TIBC, IRON, RETICCTPCT in the last 72 hours. Sepsis Labs: Recent Labs  Lab 08/07/24 2107  LATICACIDVEN 1.4    Recent Results (from the past 240 hours)  Culture, blood (routine x 2)     Status: Abnormal   Collection Time: 08/07/24  9:07 PM   Specimen: BLOOD  Result Value Ref Range Status   Specimen Description   Final    BLOOD LEFT ANTECUBITAL Performed at [facility], [address] [place], [place] [address]    Special Requests   Final    BOTTLES DRAWN AEROBIC AND ANAEROBIC Blood Culture adequate volume Performed at [facility], [address] [place], [place] [address]    Culture  Setup Time   Final    IN BOTH AEROBIC AND ANAEROBIC BOTTLES GRAM NEGATIVE RODS Gram Stain Report Called to,Read Back By and Verified With: M HOWERTON AT 0958 ON 09.02.25 BY ADGER J   CRITICAL RESULT CALLED TO, READ BACK BY AND VERIFIED WITH: RN CANDIE GULLY [phone] @ 1958 FH Performed at [facility], [address] [place], [place] [address]    Culture (A)  Final    ESCHERICHIA COLI Confirmed Extended Spectrum Beta-Lactamase Producer (ESBL).  In bloodstream infections from ESBL organisms, carbapenems are preferred over piperacillin /tazobactam. They are shown to have a lower risk of mortality.    Report Status 08/10/2024 FINAL  Final   Organism ID, Bacteria ESCHERICHIA COLI  Final      Susceptibility   Escherichia coli - MIC*    AMPICILLIN >=32 RESISTANT Resistant     CEFAZOLIN (NON-URINE) >=32 RESISTANT Resistant     CEFEPIME 16 RESISTANT Resistant     ERTAPENEM  <=0.12 SENSITIVE  Sensitive     CEFTRIAXONE  >=64 RESISTANT Resistant     CIPROFLOXACIN  >=4 RESISTANT Resistant     GENTAMICIN  <=1 SENSITIVE Sensitive     MEROPENEM  <=0.25 SENSITIVE Sensitive     TRIMETH/SULFA >=320 RESISTANT Resistant     AMPICILLIN/SULBACTAM 4 SENSITIVE Sensitive     PIP/TAZO Value in next row Sensitive ug/mL     <=4 SENSITIVEThis is a modified FDA-approved test that has been validated and its performance characteristics determined by the reporting laboratory.  This laboratory is certified under the Clinical Laboratory Improvement Amendments CLIA as qualified to perform high complexity clinical laboratory testing.    * ESCHERICHIA COLI  Culture, blood (routine x 2)     Status: Abnormal   Collection Time: 08/07/24  9:07 PM   Specimen: BLOOD LEFT ARM  Result Value Ref Range Status   Specimen Description   Final    BLOOD LEFT ARM Performed at [facility], [address], [place], [place] [address]    Special Requests   Final    BOTTLES DRAWN AEROBIC AND ANAEROBIC Blood Culture adequate volume Performed at [facility], [address] [place], [place] [address]    Culture  Setup Time   Final    IN BOTH AEROBIC AND ANAEROBIC BOTTLES GRAM NEGATIVE RODS Gram Stain Report Called  to,Read Back By and Verified With: W.EARLY AT 0916 ON 09.02.25 BY ADGER J  GRAM STAIN REVIEWED-AGREE WITH RESULT DRT    Culture (A)  Final    ESCHERICHIA COLI SUSCEPTIBILITIES PERFORMED ON PREVIOUS CULTURE WITHIN THE LAST 5 DAYS. Performed at [facility], [address] [place], [place] [address]    Report Status 08/10/2024 FINAL  Final  Blood Culture ID Panel (Reflexed)     Status: Abnormal   Collection Time: 08/07/24  9:07 PM  Result Value Ref Range Status   Enterococcus faecalis NOT DETECTED NOT DETECTED Final   Enterococcus Faecium NOT DETECTED NOT DETECTED Final   Listeria monocytogenes NOT DETECTED NOT DETECTED Final   Staphylococcus species NOT DETECTED NOT DETECTED Final   Staphylococcus aureus (BCID) NOT DETECTED NOT DETECTED Final   Staphylococcus epidermidis NOT DETECTED NOT DETECTED Final   Staphylococcus lugdunensis NOT DETECTED NOT DETECTED Final   Streptococcus species NOT DETECTED NOT DETECTED Final   Streptococcus agalactiae NOT DETECTED NOT DETECTED Final   Streptococcus pneumoniae NOT DETECTED NOT DETECTED Final   Streptococcus pyogenes NOT DETECTED NOT DETECTED Final   A.calcoaceticus-baumannii NOT DETECTED NOT DETECTED Final   Bacteroides fragilis NOT DETECTED NOT DETECTED Final   Enterobacterales DETECTED (A) NOT DETECTED Final    Comment: Enterobacterales represent a large order of gram negative bacteria, not a single organism. CRITICAL RESULT CALLED TO, READ BACK BY AND VERIFIED WITH: RN S. DESIDERIO [phone] @ 1958 FH    Enterobacter cloacae complex NOT DETECTED NOT DETECTED Final   Escherichia coli DETECTED (A) NOT DETECTED Final    Comment: CRITICAL RESULT CALLED TO, READ BACK BY AND VERIFIED WITH: RN SSABRA DESIDERIO [phone] @ 1958 FH    Klebsiella aerogenes NOT DETECTED NOT DETECTED Final   Klebsiella oxytoca NOT DETECTED NOT DETECTED Final   Klebsiella pneumoniae NOT DETECTED NOT DETECTED Final   Proteus species NOT DETECTED NOT DETECTED Final   Salmonella  species NOT DETECTED NOT DETECTED Final   Serratia marcescens NOT DETECTED NOT DETECTED Final   Haemophilus influenzae NOT DETECTED NOT DETECTED Final   Neisseria meningitidis NOT DETECTED NOT DETECTED Final   Pseudomonas aeruginosa NOT DETECTED NOT DETECTED  Final   Stenotrophomonas maltophilia NOT DETECTED NOT DETECTED Final   Candida albicans NOT DETECTED NOT DETECTED Final   Candida auris NOT DETECTED NOT DETECTED Final   Candida glabrata NOT DETECTED NOT DETECTED Final   Candida krusei NOT DETECTED NOT DETECTED Final   Candida parapsilosis NOT DETECTED NOT DETECTED Final   Candida tropicalis NOT DETECTED NOT DETECTED Final   Cryptococcus neoformans/gattii NOT DETECTED NOT DETECTED Final   CTX-M ESBL DETECTED (A) NOT DETECTED Final    Comment: CRITICAL RESULT CALLED TO, READ BACK BY AND VERIFIED WITH: RN CANDIE GULLY [phone] @ 1958 FH (NOTE) Extended spectrum beta-lactamase detected. Recommend a carbapenem as initial therapy.      Carbapenem resistance IMP NOT DETECTED NOT DETECTED Final   Carbapenem resistance KPC NOT DETECTED NOT DETECTED Final   Carbapenem resistance NDM NOT DETECTED NOT DETECTED Final   Carbapenem resist OXA 48 LIKE NOT DETECTED NOT DETECTED Final   Carbapenem resistance VIM NOT DETECTED NOT DETECTED Final    Comment: Performed at [facility], [address], [place], [place] [address]  Urine Culture (for pregnant, neutropenic or urologic patients or patients with an indwelling urinary catheter)     Status: Abnormal   Collection Time: 08/07/24 11:05 PM   Specimen: Urine, Clean Catch  Result Value Ref Range Status   Specimen Description   Final    URINE, CLEAN CATCH Performed at [facility], [address] [place], [place] [address]    Special Requests   Final    NONE Performed at [facility], [address], [place], [place] [address]    Culture (A)  Final    <10,000 COLONIES/mL INSIGNIFICANT GROWTH Performed at [facility], [address], [place], [place] [address]    Report Status 08/09/2024 FINAL  Final      Radiology Studies: No results found.     Scheduled Meds:  atorvastatin   40 mg Oral QHS   brimonidine   1 drop Both Eyes BID   enoxaparin  (LOVENOX ) injection  40 mg Subcutaneous Q24H   latanoprost   1 drop Both Eyes QHS   pantoprazole   40 mg Oral Daily   senna  1 tablet Oral BID   tamsulosin   0.4 mg Oral Daily   Continuous Infusions:  meropenem  (MERREM ) IV 1 g (08/10/24 1002)     LOS: 2 days    Devaughn KATHEE Ban, MD [facility] 08/10/2024, 3:07 PM   Available via Epic secure chat 7am-7pm After these hours, please refer to coverage provider listed on [email or web address]

## 2024-08-10 NOTE — Progress Notes (Signed)
 Mobility Specialist Progress Note:    08/10/24 1010  Mobility  Activity Ambulated with assistance  Level of Assistance Independent  Assistive Device None  Distance Ambulated (ft) 400 ft  Range of Motion/Exercises Active;All extremities  Activity Response Tolerated well  Mobility Referral Yes  Mobility visit 1 Mobility  Mobility Specialist Start Time (ACUTE ONLY) 1010  Mobility Specialist Stop Time (ACUTE ONLY) 1030  Mobility Specialist Time Calculation (min) (ACUTE ONLY) 20 min   Pt received in bed, agreeable to mobility. Independently able to stand and ambulate with no AD. Tolerated well,asx throughout. Returned supine, all needs met.  Daniel Duffy Mobility Specialist Please contact via Special educational needs teacher or  Rehab office at [phone]

## 2024-08-10 NOTE — Progress Notes (Signed)
 Mobility Specialist Progress Note:    08/10/24 1540  Mobility  Activity Ambulated with assistance  Level of Assistance Independent  Assistive Device None  Distance Ambulated (ft) 600 ft  Range of Motion/Exercises Active;All extremities  Activity Response Tolerated well  Mobility Referral Yes  Mobility visit 1 Mobility  Mobility Specialist Start Time (ACUTE ONLY) 1540  Mobility Specialist Stop Time (ACUTE ONLY) 1600  Mobility Specialist Time Calculation (min) (ACUTE ONLY) 20 min   Pt received in chair, agreeable to mobility. Independently able to stand and ambulate with no AD. Tolerated well,asx throughout. Returned to chair, all needs met.  Shadonna Benedick Mobility Specialist Please contact via Special educational needs teacher or  Rehab office at [phone]

## 2024-08-11 DIAGNOSIS — N390 Urinary tract infection, site not specified: Secondary | ICD-10-CM | POA: Diagnosis not present

## 2024-08-11 DIAGNOSIS — A419 Sepsis, unspecified organism: Secondary | ICD-10-CM | POA: Diagnosis not present

## 2024-08-11 MED ORDER — SODIUM CHLORIDE 0.9 % IV SOLN
1.0000 g | INTRAVENOUS | Status: AC
  Administered 2024-08-11 – 2024-08-14 (×4): 1 g via INTRAVENOUS
  Filled 2024-08-11 (×2): qty 1000
  Filled 2024-08-11: qty 1
  Filled 2024-08-11: qty 1000

## 2024-08-11 MED ORDER — SENNOSIDES-DOCUSATE SODIUM 8.6-50 MG PO TABS
2.0000 | ORAL_TABLET | Freq: Two times a day (BID) | ORAL | Status: DC
  Administered 2024-08-11 – 2024-08-14 (×6): 2 via ORAL
  Filled 2024-08-11 (×7): qty 2

## 2024-08-11 NOTE — Plan of Care (Signed)
   Problem: Education: Goal: Knowledge of General Education information will improve Description: Including pain rating scale, medication(s)/side effects and non-pharmacologic comfort measures Outcome: Progressing   Problem: Activity: Goal: Risk for activity intolerance will decrease Outcome: Progressing   Problem: Nutrition: Goal: Adequate nutrition will be maintained Outcome: Progressing   Problem: Coping: Goal: Level of anxiety will decrease Outcome: Progressing

## 2024-08-11 NOTE — Care Management Important Message (Signed)
 Important Message  Patient Details  Name: Daniel Duffy MRN: [id number] Date of Birth: 04/11/1951   Important Message Given:  Yes - Medicare IM     [name] L [name] 08/11/2024, 4:19 PM

## 2024-08-11 NOTE — Plan of Care (Signed)
  Problem: Education: Goal: Knowledge of General Education information will improve Description: Including pain rating scale, medication(s)/side effects and non-pharmacologic comfort measures Outcome: Progressing   Problem: Clinical Measurements: Goal: Diagnostic test results will improve Outcome: Progressing   Problem: Activity: Goal: Risk for activity intolerance will decrease Outcome: Progressing   Problem: Elimination: Goal: Will not experience complications related to bowel motility Outcome: Progressing Goal: Will not experience complications related to urinary retention Outcome: Progressing   Problem: Pain Managment: Goal: General experience of comfort will improve and/or be controlled Outcome: Progressing   Problem: Safety: Goal: Ability to remain free from injury will improve Outcome: Progressing   Problem: Skin Integrity: Goal: Risk for impaired skin integrity will decrease Outcome: Progressing

## 2024-08-11 NOTE — Progress Notes (Signed)
 Brief ID note  #ESBL ecoli bacteremia 2/2 UTI Disregard previous OPAT orders.  Logistically unable to do OPAT at home.  Does not date ID follow-up appointment as plan is to complete IV Invanz  inpatient on 08/14/2024. Dc midline. Communicated  to primary - ID will sign off

## 2024-08-11 NOTE — Progress Notes (Signed)
 Mobility Specialist Progress Note:    08/11/24 0910  Mobility  Activity Ambulated with assistance  Level of Assistance Independent  Assistive Device None  Distance Ambulated (ft) 400 ft  Range of Motion/Exercises Active;All extremities  Activity Response Tolerated well  Mobility Referral Yes  Mobility visit 1 Mobility  Mobility Specialist Start Time (ACUTE ONLY) 0910  Mobility Specialist Stop Time (ACUTE ONLY) 0930  Mobility Specialist Time Calculation (min) (ACUTE ONLY) 20 min   Pt received in bed, agreeable to mobility. Independently able to stand and ambulate with no AD. Tolerated well,asx throughout. Returned supine, all needs met.  Mika Anastasi Mobility Specialist Please contact via Special educational needs teacher or  Rehab office at [phone]

## 2024-08-11 NOTE — Progress Notes (Signed)
 Mobility Specialist Progress Note:    08/11/24 1610  Mobility  Activity Ambulated with assistance  Level of Assistance Independent  Assistive Device None  Distance Ambulated (ft) 600 ft  Range of Motion/Exercises Active;All extremities  Activity Response Tolerated well  Mobility Referral Yes  Mobility visit 1 Mobility  Mobility Specialist Start Time (ACUTE ONLY) 1610  Mobility Specialist Stop Time (ACUTE ONLY) 1630  Mobility Specialist Time Calculation (min) (ACUTE ONLY) 20 min   Pt received in bed, agreeable to mobility. Independently able to stand and ambulate with no AD. Tolerated well,asx throughout. Returned supine, all needs met.  Julliette Frentz Mobility Specialist Please contact via Special educational needs teacher or  Rehab office at [phone]

## 2024-08-11 NOTE — Progress Notes (Addendum)
 PROGRESS NOTE  Daniel Duffy  FMW:[id number] DOB: 30-Aug-1951 DOA: 08/07/2024 PCP: [facility]   Brief Narrative:  Daniel Duffy is a 73yo with h/o HLD, BPH, and ESBL UTI in 06/2024 who presented with RLQ abdominal pan and sweats.  SIRS physiology with fever, tachycardia, tachypnea, leukocytosis. UA suggestive of UTI, + bacteremia with GNR.  Sepsis ruled in.  Treated with Zosyn  -> meropenem --Invanz  -Blood cultures from 08/07/2024  (2/2) with ESBL E. coli -- Last dose of IV Invanz  08/14/2024  New events last 24 hours / Subjective:  No fever  Or chills  No Nausea, Vomiting or Diarrhea - Eating and  drinking well - No dysuria  Assessment & Plan:   Principal Problem:   Sepsis secondary to UTI [facility]) Active Problems:   Prostate nodule   History of ESBL E. coli infection   Hypokalemia   Bacteremia   Sepsis secondary to ESBL bacteremia - Recent history of ESBL UTI 07/06/2024 - Urine culture 9/1 with insignificant growth but had received abx - CT abdomen benign, no signs pyelo, abscess, etc. --Blood cultures from 08/07/2024  (2/2) with ESBL E. coli -- Last dose of IV Invanz  08/14/2024--logistically unable to do OPAT at home - Repeat blood cultures from 08/10/2024 NGTD WBC 11.5 >>9.8  BPH Reports no issues with urination, no obstruction seen on CT -c/n Flomax  - f/u urology ([facility]) after d/c  Hyperlipidemia -c/n  Lipitor  GERD---c/n Protonix    DVT prophylaxis:  enoxaparin  (LOVENOX ) injection 40 mg Start: 08/08/24 1000  Code Status: Full code Family Communication: daughter crystal previously updated  disposition Plan: Home on 08/14/24 after completion of IV INvanz  Status is: Inpatient Remains inpatient appropriate because: IV antibiotics  Antimicrobials:  Anti-infectives (From admission, onward)    Start     Dose/Rate Route Frequency Ordered Stop   08/11/24 1000  ertapenem  (INVANZ ) 1 g in sodium chloride  0.9 % 100 mL IVPB        1 g 200 mL/hr over 30  Minutes Intravenous Every 24 hours 08/11/24 0755 08/15/24 0959   08/08/24 0200  meropenem  (MERREM ) 1 g in sodium chloride  0.9 % 100 mL IVPB  Status:  Discontinued        1 g 200 mL/hr over 30 Minutes Intravenous Every 8 hours 08/08/24 0018 08/11/24 0755   08/07/24 2200  piperacillin -tazobactam (ZOSYN ) IVPB 3.375 g  Status:  Discontinued        3.375 g 100 mL/hr over 30 Minutes Intravenous Every 8 hours 08/07/24 2053 08/07/24 2126   08/07/24 2200  piperacillin -tazobactam (ZOSYN ) IVPB 3.375 g  Status:  Discontinued        3.375 g 12.5 mL/hr over 240 Minutes Intravenous Every 8 hours 08/07/24 2126 08/08/24 0007        Objective: Vitals:   08/10/24 0525 08/10/24 1426 08/10/24 1900 08/11/24 0500  BP: 128/80 97/65 121/77 116/82  Pulse: 87 80 73 85  Resp: 18 17 18 18   Temp:  98.6 F (37 C) 99.6 F (37.6 C) 99.3 F (37.4 C)  TempSrc:  Oral Oral Oral  SpO2: 96% 94% 98% 94%  Weight:      Height:        Intake/Output Summary (Last 24 hours) at 08/11/2024 1018 Last data filed at 08/11/2024 0700 Gross per 24 hour  Intake 240 ml  Output 2225 ml  Net -1985 ml   Filed Weights   08/07/24 2100 08/08/24 0139  Weight: 70.3 kg 68.9 kg    Physical Exam Gen:- Awake Alert, in no acute  distress  HEENT:- Grill.AT, No sclera icterus Neck-Supple Neck,No JVD,.  Lungs-  CTAB , fair air movement bilaterally  CV- S1, S2 normal, RRR Abd-  +ve B.Sounds, Abd Soft, No tenderness, no CVA area tenderness Extremity/Skin:- No  edema,   good pedal pulses  Psych-affect is appropriate, oriented x3 Neuro-no new focal deficits, no tremors   Data Reviewed: I have personally reviewed following labs and imaging studies  CBC: Recent Labs  Lab 08/07/24 2107 08/08/24 0444 08/09/24 0420  WBC 11.0* 11.5* 9.8  HGB 11.7* 10.4* 11.4*  HCT 35.8* 32.0* 35.0*  MCV 85.9 86.3 86.6  PLT 168 144* 166   Basic Metabolic Panel: Recent Labs  Lab 08/07/24 2107 08/08/24 0444 08/09/24 0420  NA 136 138 138  K 3.3*  3.4* 3.9  CL 102 103 107  CO2 25 25 26   GLUCOSE 135* 163* 134*  BUN 16 16 14   CREATININE 1.32* 1.23 1.19  CALCIUM  10.3 10.2 10.2  MG  --  1.7  --    GFR: Estimated Creatinine Clearance: 55.5 mL/min (by C-G formula based on SCr of 1.19 mg/dL). Liver Function Tests: Recent Labs  Lab 08/07/24 2107  AST 36  ALT 25  ALKPHOS 71  BILITOT 0.7  PROT 6.9  ALBUMIN 3.3*   Recent Labs  Lab 08/07/24 2107  LIPASE 28   Sepsis Labs: Recent Labs  Lab 08/07/24 2107  LATICACIDVEN 1.4    Recent Results (from the past 240 hours)  Culture, blood (routine x 2)     Status: Abnormal   Collection Time: 08/07/24  9:07 PM   Specimen: BLOOD  Result Value Ref Range Status   Specimen Description   Final    BLOOD LEFT ANTECUBITAL Performed at [facility], [address], [place], [place] [address]    Special Requests   Final    BOTTLES DRAWN AEROBIC AND ANAEROBIC Blood Culture adequate volume Performed at [facility], [address], [place], [place] [address]    Culture  Setup Time   Final    IN BOTH AEROBIC AND ANAEROBIC BOTTLES GRAM NEGATIVE RODS Gram Stain Report Called to,Read Back By and Verified With: M HOWERTON AT 0958 ON 09.02.25 BY ADGER J  CRITICAL RESULT CALLED TO, READ BACK BY AND VERIFIED WITH: RN CANDIE GULLY [phone] @ 1958 FH Performed at [facility], [address] [place], [place] [address]    Culture (A)  Final    ESCHERICHIA COLI Confirmed Extended Spectrum Beta-Lactamase Producer (ESBL).  In bloodstream infections from ESBL organisms, carbapenems are preferred over piperacillin /tazobactam. They are shown to have a lower risk of mortality.    Report Status 08/10/2024 FINAL  Final   Organism ID, Bacteria ESCHERICHIA COLI  Final      Susceptibility   Escherichia coli - MIC*    AMPICILLIN >=32 RESISTANT Resistant     CEFAZOLIN (NON-URINE) >=32 RESISTANT Resistant     CEFEPIME 16 RESISTANT Resistant     ERTAPENEM  <=0.12 SENSITIVE Sensitive     CEFTRIAXONE  >=64  RESISTANT Resistant     CIPROFLOXACIN  >=4 RESISTANT Resistant     GENTAMICIN  <=1 SENSITIVE Sensitive     MEROPENEM  <=0.25 SENSITIVE Sensitive     TRIMETH/SULFA >=320 RESISTANT Resistant     AMPICILLIN/SULBACTAM 4 SENSITIVE Sensitive     PIP/TAZO Value in next row Sensitive ug/mL     <=4 SENSITIVEThis is a modified FDA-approved test that has been validated and its performance characteristics determined by the reporting laboratory.  This laboratory is certified under  the Clinical Laboratory Improvement Amendments CLIA as qualified to perform high complexity clinical laboratory testing.    * ESCHERICHIA COLI  Culture, blood (routine x 2)     Status: Abnormal   Collection Time: 08/07/24  9:07 PM   Specimen: BLOOD LEFT ARM  Result Value Ref Range Status   Specimen Description   Final    BLOOD LEFT ARM Performed at [facility], [address], [place], [place] [address]    Special Requests   Final    BOTTLES DRAWN AEROBIC AND ANAEROBIC Blood Culture adequate volume Performed at [facility], [address] [place], [place] [address]    Culture  Setup Time   Final    IN BOTH AEROBIC AND ANAEROBIC BOTTLES GRAM NEGATIVE RODS Gram Stain Report Called to,Read Back By and Verified With: W.EARLY AT 0916 ON 09.02.25 BY ADGER J  GRAM STAIN REVIEWED-AGREE WITH RESULT DRT    Culture (A)  Final    ESCHERICHIA COLI SUSCEPTIBILITIES PERFORMED ON PREVIOUS CULTURE WITHIN THE LAST 5 DAYS. Performed at [facility], [address] [place], [place] [address]    Report Status 08/10/2024 FINAL  Final  Blood Culture ID Panel (Reflexed)     Status: Abnormal   Collection Time: 08/07/24  9:07 PM  Result Value Ref Range Status   Enterococcus faecalis NOT DETECTED NOT DETECTED Final   Enterococcus Faecium NOT DETECTED NOT DETECTED Final   Listeria monocytogenes NOT DETECTED NOT DETECTED Final   Staphylococcus species NOT DETECTED NOT DETECTED Final   Staphylococcus aureus (BCID) NOT DETECTED NOT  DETECTED Final   Staphylococcus epidermidis NOT DETECTED NOT DETECTED Final   Staphylococcus lugdunensis NOT DETECTED NOT DETECTED Final   Streptococcus species NOT DETECTED NOT DETECTED Final   Streptococcus agalactiae NOT DETECTED NOT DETECTED Final   Streptococcus pneumoniae NOT DETECTED NOT DETECTED Final   Streptococcus pyogenes NOT DETECTED NOT DETECTED Final   A.calcoaceticus-baumannii NOT DETECTED NOT DETECTED Final   Bacteroides fragilis NOT DETECTED NOT DETECTED Final   Enterobacterales DETECTED (A) NOT DETECTED Final    Comment: Enterobacterales represent a large order of gram negative bacteria, not a single organism. CRITICAL RESULT CALLED TO, READ BACK BY AND VERIFIED WITH: RN S. DESIDERIO [phone] @ 1958 FH    Enterobacter cloacae complex NOT DETECTED NOT DETECTED Final   Escherichia coli DETECTED (A) NOT DETECTED Final    Comment: CRITICAL RESULT CALLED TO, READ BACK BY AND VERIFIED WITH: RN SSABRA DESIDERIO [phone] @ 1958 FH    Klebsiella aerogenes NOT DETECTED NOT DETECTED Final   Klebsiella oxytoca NOT DETECTED NOT DETECTED Final   Klebsiella pneumoniae NOT DETECTED NOT DETECTED Final   Proteus species NOT DETECTED NOT DETECTED Final   Salmonella species NOT DETECTED NOT DETECTED Final   Serratia marcescens NOT DETECTED NOT DETECTED Final   Haemophilus influenzae NOT DETECTED NOT DETECTED Final   Neisseria meningitidis NOT DETECTED NOT DETECTED Final   Pseudomonas aeruginosa NOT DETECTED NOT DETECTED Final   Stenotrophomonas maltophilia NOT DETECTED NOT DETECTED Final   Candida albicans NOT DETECTED NOT DETECTED Final   Candida auris NOT DETECTED NOT DETECTED Final   Candida glabrata NOT DETECTED NOT DETECTED Final   Candida krusei NOT DETECTED NOT DETECTED Final   Candida parapsilosis NOT DETECTED NOT DETECTED Final   Candida tropicalis NOT DETECTED NOT DETECTED Final   Cryptococcus neoformans/gattii NOT DETECTED NOT DETECTED Final   CTX-M ESBL DETECTED (A) NOT DETECTED Final     Comment: CRITICAL RESULT CALLED TO,  READ BACK BY AND VERIFIED WITH: RN CANDIE GULLY [phone] @ 1958 FH (NOTE) Extended spectrum beta-lactamase detected. Recommend a carbapenem as initial therapy.      Carbapenem resistance IMP NOT DETECTED NOT DETECTED Final   Carbapenem resistance KPC NOT DETECTED NOT DETECTED Final   Carbapenem resistance NDM NOT DETECTED NOT DETECTED Final   Carbapenem resist OXA 48 LIKE NOT DETECTED NOT DETECTED Final   Carbapenem resistance VIM NOT DETECTED NOT DETECTED Final    Comment: Performed at [facility], [address] [place], [place] [address]  Urine Culture (for pregnant, neutropenic or urologic patients or patients with an indwelling urinary catheter)     Status: Abnormal   Collection Time: 08/07/24 11:05 PM   Specimen: Urine, Clean Catch  Result Value Ref Range Status   Specimen Description   Final    URINE, CLEAN CATCH Performed at [facility], [address] [place], [place] [address]    Special Requests   Final    NONE Performed at [facility], [address] [place], [place] [address]    Culture (A)  Final    <10,000 COLONIES/mL INSIGNIFICANT GROWTH Performed at [facility], [address], [place], [place] [address]    Report Status 08/09/2024 FINAL  Final  Culture, blood (Routine X 2) w Reflex to ID Panel     Status: None (Preliminary result)   Collection Time: 08/10/24  3:14 PM   Specimen: BLOOD  Result Value Ref Range Status   Specimen Description BLOOD BLOOD LEFT ARM  Final   Special Requests   Final    Blood Culture adequate volume BOTTLES DRAWN AEROBIC AND ANAEROBIC   Culture   Final    NO GROWTH < 24 HOURS Performed at [facility], [address], [place], [place] [address]    Report Status PENDING  Incomplete  Culture, blood (Routine X 2) w Reflex to ID Panel     Status: None (Preliminary result)   Collection Time: 08/10/24  3:14 PM   Specimen: BLOOD  Result Value Ref Range Status   Specimen Description BLOOD  RIGHT ANTECUBITAL  Final   Special Requests   Final    BOTTLES DRAWN AEROBIC AND ANAEROBIC Blood Culture adequate volume   Culture   Final    NO GROWTH < 24 HOURS Performed at [facility], [address], [place], [place] [address]    Report Status PENDING  Incomplete    Scheduled Meds:  atorvastatin   40 mg Oral QHS   brimonidine   1 drop Both Eyes BID   enoxaparin  (LOVENOX ) injection  40 mg Subcutaneous Q24H   latanoprost   1 drop Both Eyes QHS   pantoprazole   40 mg Oral Daily   senna  1 tablet Oral BID   tamsulosin   0.8 mg Oral Daily   Continuous Infusions:  sodium chloride  10 mL/hr at 08/10/24 1739   ertapenem  1 g (08/11/24 0930)    LOS: 3 days   Rendall Carwin, MD [facility] 08/11/2024, 10:18 AM   Available via Epic secure chat 7am-7pm After these hours, please refer to coverage provider listed on [email or web address]

## 2024-08-11 NOTE — Progress Notes (Signed)
 ID Brief note: #ESBL ecoli bacteremia 2/2 UTI -2/2 blood cx on 9/1 esbl ecoli -Urne cx obtained after 1 dose of zosyn . UCX insignificant growth.  Given that ESBL E. coli grew from urine cultures on 7/31 and that UA was obtained following antibiotics, suspect bacteremia secondary to urinary source. - Plan on treatment 7 days of appropriate antibiotics for bacteremia secondary to UTI - CT abdomen pelvis showed no localizing process in abdomen or pelvis OPAT ORDERS:  Diagnosis: ESBL ecoli bacteremia 2/2 urinary source  Allergies  Allergen Reactions   Nitrates, Organic Other (See Comments)    Could not control of body     Discharge antibiotics to be given via PICC line:  Per pharmacy protocol ertapenem  1gm q24h   Duration: 1 week End Date: 9/8  [facility] Per Protocol with [employer] Use: Home health RN for IV administration and teaching, line care and labs.    Labs weekly while on IV antibiotics: __x CBC with differential __ BMP **TWICE WEEKLY ON VANCOMYCIN  _x_ CMP __ CRP __ ESR __ Vancomycin trough TWICE WEEKLY __ CK  _x_ Please pull PIC at completion of IV antibiotics __ Please leave PIC in place until doctor has seen patient or been notified  Fax weekly labs to [phone]  Clinic Follow Up Appt: 9/25  @ [facility] with Denn[facility]

## 2024-08-12 DIAGNOSIS — N390 Urinary tract infection, site not specified: Secondary | ICD-10-CM | POA: Diagnosis not present

## 2024-08-12 DIAGNOSIS — A419 Sepsis, unspecified organism: Secondary | ICD-10-CM | POA: Diagnosis not present

## 2024-08-12 NOTE — Plan of Care (Signed)
  Problem: Education: Goal: Knowledge of General Education information will improve Description: Including pain rating scale, medication(s)/side effects and non-pharmacologic comfort measures Outcome: Progressing   Problem: Clinical Measurements: Goal: Ability to maintain clinical measurements within normal limits will improve Outcome: Progressing   Problem: Safety: Goal: Ability to remain free from injury will improve Outcome: Progressing   

## 2024-08-12 NOTE — Plan of Care (Signed)
  Problem: Education: Goal: Knowledge of General Education information will improve Description: Including pain rating scale, medication(s)/side effects and non-pharmacologic comfort measures Outcome: Progressing   Problem: Clinical Measurements: Goal: Ability to maintain clinical measurements within normal limits will improve Outcome: Progressing Goal: Will remain free from infection Outcome: Progressing Goal: Diagnostic test results will improve Outcome: Progressing Goal: Cardiovascular complication will be avoided Outcome: Progressing   Problem: Activity: Goal: Risk for activity intolerance will decrease Outcome: Progressing   Problem: Nutrition: Goal: Adequate nutrition will be maintained Outcome: Progressing   Problem: Elimination: Goal: Will not experience complications related to urinary retention Outcome: Progressing   Problem: Safety: Goal: Ability to remain free from injury will improve Outcome: Progressing   Problem: Skin Integrity: Goal: Risk for impaired skin integrity will decrease Outcome: Progressing

## 2024-08-12 NOTE — Progress Notes (Signed)
 PROGRESS NOTE  Tion Tse  FMW:[id number] DOB: May 21, 1951 DOA: 08/07/2024 PCP: [facility]   Brief Narrative:  Daniel Duffy is a 73yo with h/o HLD, BPH, and ESBL UTI in 06/2024 who presented with RLQ abdominal pan and sweats.  SIRS physiology with fever, tachycardia, tachypnea, leukocytosis. UA suggestive of UTI, + bacteremia with GNR.  Sepsis ruled in.  Treated with Zosyn  -> meropenem --Invanz  -Blood cultures from 08/07/2024  (2/2) with ESBL E. coli -- Last dose of IV Invanz  08/14/2024 --Unable to discharge patient home due to logistical challenges with completing IV Invanz  antibiotic at home, so patient will stay here through 08/14/2024 to complete IV Invanz  antibiotic as recommended by ID physician  New events last 24 hours / Subjective:  -No new concerns - Resting comfortably =-No fevers, no diarrhea ---Unable to discharge patient home due to logistical challenges with completing IV Invanz  antibiotic at home, so patient will stay here through 08/14/2024 to complete IV Invanz  antibiotic as recommended by ID physician  Assessment & Plan:   Principal Problem:   Sepsis secondary to UTI [facility]) Active Problems:   Prostate nodule   History of ESBL E. coli infection   Hypokalemia   Bacteremia   Sepsis secondary to ESBL bacteremia - Recent history of ESBL UTI 07/06/2024 - Urine culture 9/1 with insignificant growth but had received abx - CT abdomen benign, no signs pyelo, abscess, etc. --Blood cultures from 08/07/2024  (2/2) with ESBL E. coli -- Last dose of IV Invanz  08/14/2024--logistically unable to do OPAT at home - Repeat blood cultures from 08/10/2024 NGTD WBC 11.5 >>9.8  BPH Reports no issues with urination, no obstruction seen on CT -c/n Flomax  - f/u urology ([facility]) after d/c  Hyperlipidemia -c/n  Lipitor  GERD---c/n Protonix    Disposition:- --Unable to discharge patient home due to logistical challenges with completing IV Invanz  antibiotic at home, so  patient will stay here through 08/14/2024 to complete IV Invanz  antibiotic as recommended by ID physician  DVT prophylaxis:  enoxaparin  (LOVENOX ) injection 40 mg Start: 08/08/24 1000  Code Status: Full code Family Communication: daughter crystal previously updated  disposition Plan: Home on 08/14/24 after completion of IV INvanz  Status is: Inpatient Remains inpatient appropriate because: IV antibiotics  Antimicrobials:  Anti-infectives (From admission, onward)    Start     Dose/Rate Route Frequency Ordered Stop   08/11/24 1000  ertapenem  (INVANZ ) 1 g in sodium chloride  0.9 % 100 mL IVPB        1 g 200 mL/hr over 30 Minutes Intravenous Every 24 hours 08/11/24 0755 08/15/24 0959   08/08/24 0200  meropenem  (MERREM ) 1 g in sodium chloride  0.9 % 100 mL IVPB  Status:  Discontinued        1 g 200 mL/hr over 30 Minutes Intravenous Every 8 hours 08/08/24 0018 08/11/24 0755   08/07/24 2200  piperacillin -tazobactam (ZOSYN ) IVPB 3.375 g  Status:  Discontinued        3.375 g 100 mL/hr over 30 Minutes Intravenous Every 8 hours 08/07/24 2053 08/07/24 2126   08/07/24 2200  piperacillin -tazobactam (ZOSYN ) IVPB 3.375 g  Status:  Discontinued        3.375 g 12.5 mL/hr over 240 Minutes Intravenous Every 8 hours 08/07/24 2126 08/08/24 0007        Objective: Vitals:   08/11/24 1333 08/11/24 1340 08/11/24 1959 08/12/24 0459  BP: (!) 88/67 98/69 103/70 105/69  Pulse: 84  75 83  Resp: 17  18 18   Temp: 99.2 F (37.3 C)  99.7 F (37.6 C) 99.2 F (37.3 C)  TempSrc: Oral  Oral Oral  SpO2: 96%  97% 95%  Weight:      Height:        Intake/Output Summary (Last 24 hours) at 08/12/2024 1048 Last data filed at 08/12/2024 1025 Gross per 24 hour  Intake 1068.96 ml  Output 1450 ml  Net -381.04 ml   Filed Weights   08/07/24 2100 08/08/24 0139  Weight: 70.3 kg 68.9 kg    Physical Exam Gen:- Awake Alert, in no acute distress , frail-appearing HEENT:- [place].AT, No sclera icterus Neck-Supple Neck,No JVD,.   Lungs-  CTAB , fair air movement bilaterally  CV- S1, S2 normal, RRR Abd-  +ve B.Sounds, Abd Soft, No tenderness, no CVA area tenderness Extremity/Skin:- No  edema,   good pedal pulses  Psych-affect is appropriate, oriented x3 Neuro-no new focal deficits, no tremors   Data Reviewed: I have personally reviewed following labs and imaging studies  CBC: Recent Labs  Lab 08/07/24 2107 08/08/24 0444 08/09/24 0420  WBC 11.0* 11.5* 9.8  HGB 11.7* 10.4* 11.4*  HCT 35.8* 32.0* 35.0*  MCV 85.9 86.3 86.6  PLT 168 144* 166   Basic Metabolic Panel: Recent Labs  Lab 08/07/24 2107 08/08/24 0444 08/09/24 0420  NA 136 138 138  K 3.3* 3.4* 3.9  CL 102 103 107  CO2 25 25 26   GLUCOSE 135* 163* 134*  BUN 16 16 14   CREATININE 1.32* 1.23 1.19  CALCIUM  10.3 10.2 10.2  MG  --  1.7  --    GFR: Estimated Creatinine Clearance: 55.5 mL/min (by C-G formula based on SCr of 1.19 mg/dL). Liver Function Tests: Recent Labs  Lab 08/07/24 2107  AST 36  ALT 25  ALKPHOS 71  BILITOT 0.7  PROT 6.9  ALBUMIN 3.3*   Recent Labs  Lab 08/07/24 2107  LIPASE 28   Sepsis Labs: Recent Labs  Lab 08/07/24 2107  LATICACIDVEN 1.4    Recent Results (from the past 240 hours)  Culture, blood (routine x 2)     Status: Abnormal   Collection Time: 08/07/24  9:07 PM   Specimen: BLOOD  Result Value Ref Range Status   Specimen Description   Final    BLOOD LEFT ANTECUBITAL Performed at [facility], [address] [place], [place] [address]    Special Requests   Final    BOTTLES DRAWN AEROBIC AND ANAEROBIC Blood Culture adequate volume Performed at [facility], [address] [place], [place] [address]    Culture  Setup Time   Final    IN BOTH AEROBIC AND ANAEROBIC BOTTLES GRAM NEGATIVE RODS Gram Stain Report Called to,Read Back By and Verified With: M HOWERTON AT 0958 ON 09.02.25 BY ADGER J  CRITICAL RESULT CALLED TO, READ BACK BY AND VERIFIED WITH: RN CANDIE GULLY [phone] @ 1958 FH Performed at [facility], [address], [place], [place] [address]    Culture (A)  Final    ESCHERICHIA COLI Confirmed Extended Spectrum Beta-Lactamase Producer (ESBL).  In bloodstream infections from ESBL organisms, carbapenems are preferred over piperacillin /tazobactam. They are shown to have a lower risk of mortality.    Report Status 08/10/2024 FINAL  Final   Organism ID, Bacteria ESCHERICHIA COLI  Final      Susceptibility   Escherichia coli - MIC*    AMPICILLIN >=32 RESISTANT Resistant     CEFAZOLIN (NON-URINE) >=32 RESISTANT Resistant     CEFEPIME 16 RESISTANT Resistant  ERTAPENEM  <=0.12 SENSITIVE Sensitive     CEFTRIAXONE  >=64 RESISTANT Resistant     CIPROFLOXACIN  >=4 RESISTANT Resistant     GENTAMICIN  <=1 SENSITIVE Sensitive     MEROPENEM  <=0.25 SENSITIVE Sensitive     TRIMETH/SULFA >=320 RESISTANT Resistant     AMPICILLIN/SULBACTAM 4 SENSITIVE Sensitive     PIP/TAZO Value in next row Sensitive ug/mL     <=4 SENSITIVEThis is a modified FDA-approved test that has been validated and its performance characteristics determined by the reporting laboratory.  This laboratory is certified under the Clinical Laboratory Improvement Amendments CLIA as qualified to perform high complexity clinical laboratory testing.    * ESCHERICHIA COLI  Culture, blood (routine x 2)     Status: Abnormal   Collection Time: 08/07/24  9:07 PM   Specimen: BLOOD LEFT ARM  Result Value Ref Range Status   Specimen Description   Final    BLOOD LEFT ARM Performed at [facility], [address], [place], [place] [address]    Special Requests   Final    BOTTLES DRAWN AEROBIC AND ANAEROBIC Blood Culture adequate volume Performed at [facility], [address], [place], [place] [address]    Culture  Setup Time   Final    IN BOTH AEROBIC AND ANAEROBIC BOTTLES GRAM NEGATIVE RODS Gram Stain Report Called to,Read Back By and Verified With: W.EARLY AT 0916 ON 09.02.25 BY ADGER J  GRAM STAIN REVIEWED-AGREE WITH  RESULT DRT    Culture (A)  Final    ESCHERICHIA COLI SUSCEPTIBILITIES PERFORMED ON PREVIOUS CULTURE WITHIN THE LAST 5 DAYS. Performed at [facility], [address], [place], [place] [address]    Report Status 08/10/2024 FINAL  Final  Blood Culture ID Panel (Reflexed)     Status: Abnormal   Collection Time: 08/07/24  9:07 PM  Result Value Ref Range Status   Enterococcus faecalis NOT DETECTED NOT DETECTED Final   Enterococcus Faecium NOT DETECTED NOT DETECTED Final   Listeria monocytogenes NOT DETECTED NOT DETECTED Final   Staphylococcus species NOT DETECTED NOT DETECTED Final   Staphylococcus aureus (BCID) NOT DETECTED NOT DETECTED Final   Staphylococcus epidermidis NOT DETECTED NOT DETECTED Final   Staphylococcus lugdunensis NOT DETECTED NOT DETECTED Final   Streptococcus species NOT DETECTED NOT DETECTED Final   Streptococcus agalactiae NOT DETECTED NOT DETECTED Final   Streptococcus pneumoniae NOT DETECTED NOT DETECTED Final   Streptococcus pyogenes NOT DETECTED NOT DETECTED Final   A.calcoaceticus-baumannii NOT DETECTED NOT DETECTED Final   Bacteroides fragilis NOT DETECTED NOT DETECTED Final   Enterobacterales DETECTED (A) NOT DETECTED Final    Comment: Enterobacterales represent a large order of gram negative bacteria, not a single organism. CRITICAL RESULT CALLED TO, READ BACK BY AND VERIFIED WITH: RN S. DESIDERIO [phone] @ 1958 FH    Enterobacter cloacae complex NOT DETECTED NOT DETECTED Final   Escherichia coli DETECTED (A) NOT DETECTED Final    Comment: CRITICAL RESULT CALLED TO, READ BACK BY AND VERIFIED WITH: RN SSABRA DESIDERIO [phone] @ 1958 FH    Klebsiella aerogenes NOT DETECTED NOT DETECTED Final   Klebsiella oxytoca NOT DETECTED NOT DETECTED Final   Klebsiella pneumoniae NOT DETECTED NOT DETECTED Final   Proteus species NOT DETECTED NOT DETECTED Final   Salmonella species NOT DETECTED NOT DETECTED Final   Serratia marcescens NOT DETECTED NOT DETECTED Final    Haemophilus influenzae NOT DETECTED NOT DETECTED Final   Neisseria meningitidis NOT DETECTED NOT DETECTED Final   Pseudomonas aeruginosa NOT  DETECTED NOT DETECTED Final   Stenotrophomonas maltophilia NOT DETECTED NOT DETECTED Final   Candida albicans NOT DETECTED NOT DETECTED Final   Candida auris NOT DETECTED NOT DETECTED Final   Candida glabrata NOT DETECTED NOT DETECTED Final   Candida krusei NOT DETECTED NOT DETECTED Final   Candida parapsilosis NOT DETECTED NOT DETECTED Final   Candida tropicalis NOT DETECTED NOT DETECTED Final   Cryptococcus neoformans/gattii NOT DETECTED NOT DETECTED Final   CTX-M ESBL DETECTED (A) NOT DETECTED Final    Comment: CRITICAL RESULT CALLED TO, READ BACK BY AND VERIFIED WITH: RN CANDIE GULLY [phone] @ 1958 FH (NOTE) Extended spectrum beta-lactamase detected. Recommend a carbapenem as initial therapy.      Carbapenem resistance IMP NOT DETECTED NOT DETECTED Final   Carbapenem resistance KPC NOT DETECTED NOT DETECTED Final   Carbapenem resistance NDM NOT DETECTED NOT DETECTED Final   Carbapenem resist OXA 48 LIKE NOT DETECTED NOT DETECTED Final   Carbapenem resistance VIM NOT DETECTED NOT DETECTED Final    Comment: Performed at [facility], [address], [place], [place] [address]  Urine Culture (for pregnant, neutropenic or urologic patients or patients with an indwelling urinary catheter)     Status: Abnormal   Collection Time: 08/07/24 11:05 PM   Specimen: Urine, Clean Catch  Result Value Ref Range Status   Specimen Description   Final    URINE, CLEAN CATCH Performed at [facility], [address] [place], [place] [address]    Special Requests   Final    NONE Performed at [facility], [address], [place], [place] [address]    Culture (A)  Final    <10,000 COLONIES/mL INSIGNIFICANT GROWTH Performed at [facility], [address] [place], [place] [address]    Report Status 08/09/2024 FINAL  Final  Culture, blood (Routine X  2) w Reflex to ID Panel     Status: None (Preliminary result)   Collection Time: 08/10/24  3:14 PM   Specimen: BLOOD  Result Value Ref Range Status   Specimen Description BLOOD BLOOD LEFT ARM  Final   Special Requests   Final    Blood Culture adequate volume BOTTLES DRAWN AEROBIC AND ANAEROBIC   Culture   Final    NO GROWTH 2 DAYS Performed at [facility], [address], [place], [place] [address]    Report Status PENDING  Incomplete  Culture, blood (Routine X 2) w Reflex to ID Panel     Status: None (Preliminary result)   Collection Time: 08/10/24  3:14 PM   Specimen: BLOOD  Result Value Ref Range Status   Specimen Description BLOOD RIGHT ANTECUBITAL  Final   Special Requests   Final    BOTTLES DRAWN AEROBIC AND ANAEROBIC Blood Culture adequate volume   Culture   Final    NO GROWTH 2 DAYS Performed at [facility], [address] [place], [place] [address]    Report Status PENDING  Incomplete    Scheduled Meds:  atorvastatin   40 mg Oral QHS   brimonidine   1 drop Both Eyes BID   enoxaparin  (LOVENOX ) injection  40 mg Subcutaneous Q24H   latanoprost   1 drop Both Eyes QHS   pantoprazole   40 mg Oral Daily   senna-docusate  2 tablet Oral BID   tamsulosin   0.8 mg Oral Daily   Continuous Infusions:  ertapenem  1 g (08/12/24 1025)    LOS: 4 days   Rendall Carwin, MD [facility] 08/12/2024, 10:48 AM  Available via Epic secure chat 7am-7pm After these hours, please refer to coverage provider listed on [email or web address]

## 2024-08-13 DIAGNOSIS — N390 Urinary tract infection, site not specified: Secondary | ICD-10-CM | POA: Diagnosis not present

## 2024-08-13 DIAGNOSIS — A419 Sepsis, unspecified organism: Secondary | ICD-10-CM | POA: Diagnosis not present

## 2024-08-13 LAB — RENAL FUNCTION PANEL
Albumin: 2.6 g/dL — ABNORMAL LOW (ref 3.5–5.0)
Anion gap: 6 (ref 5–15)
BUN: 12 mg/dL (ref 8–23)
CO2: 24 mmol/L (ref 22–32)
Calcium: 9.9 mg/dL (ref 8.9–10.3)
Chloride: 105 mmol/L (ref 98–111)
Creatinine, Ser: 1.07 mg/dL (ref 0.61–1.24)
GFR, Estimated: >60 mL/min (ref >60)
Glucose, Bld: 107 mg/dL — ABNORMAL HIGH (ref 70–99)
Phosphorus: 2.4 mg/dL — ABNORMAL LOW (ref 2.5–4.6)
Potassium: 3.8 mmol/L (ref 3.5–5.1)
Sodium: 135 mmol/L (ref 135–145)

## 2024-08-13 LAB — CBC
HCT: 35.3 % — ABNORMAL LOW (ref 39.0–52.0)
Hemoglobin: 11.4 g/dL — ABNORMAL LOW (ref 13.0–17.0)
MCH: 27.7 pg (ref 26.0–34.0)
MCHC: 32.3 g/dL (ref 30.0–36.0)
MCV: 85.9 fL (ref 80.0–100.0)
Platelets: 224 K/uL (ref 150–400)
RBC: 4.11 MIL/uL — ABNORMAL LOW (ref 4.22–5.81)
RDW: 15.5 % (ref 11.5–15.5)
WBC: 4.5 K/uL (ref 4.0–10.5)
nRBC: 0 % (ref 0.0–0.2)

## 2024-08-13 MED ORDER — K PHOS MONO-SOD PHOS DI & MONO 155-852-130 MG PO TABS
500.0000 mg | ORAL_TABLET | Freq: Three times a day (TID) | ORAL | Status: AC
  Administered 2024-08-13 – 2024-08-14 (×4): 500 mg via ORAL
  Filled 2024-08-13 (×4): qty 2

## 2024-08-13 NOTE — Plan of Care (Signed)

## 2024-08-13 NOTE — Progress Notes (Signed)
 Patient called out complaining of chest pain. This RN went into the room and he stated that it started after he sat up on the side of the bed to use to urinal. Patient stated it was a 2/10 on the pain scale and described it as pressure. No complaints of SOB any other symptoms. Adefeso DO notified, EKG and vitals obtained. Vitals within normal limits and EKG showed normal sinus rhythm. Patient stated that the pain started to subside. MD notified again. No additional orders at this time.

## 2024-08-13 NOTE — Progress Notes (Signed)
 PROGRESS NOTE  Daniel Duffy  FMW:[id number] DOB: [birth year] DOA: 08/07/2024 PCP: [facility]   Brief Narrative:  Daniel Duffy is a 73yo with h/o HLD, BPH, and ESBL UTI in 06/2024 who presented with RLQ abdominal pan and sweats.  SIRS physiology with fever, tachycardia, tachypnea, leukocytosis. UA suggestive of UTI, + bacteremia with GNR.  Sepsis ruled in.  Treated with Zosyn  -> meropenem --Invanz  -Blood cultures from 08/07/2024  (2/2) with ESBL E. coli -- Last dose of IV Invanz  08/14/2024 --Unable to discharge patient home due to logistical challenges with completing IV Invanz  antibiotic at home, so patient will stay here through 08/14/2024 to complete IV Invanz  antibiotic as recommended by ID physician  New events last 24 hours / Subjective:  -Episode of chest discomfort overnight---- -Ambulating around with no further chest pains -Not drinking well and voiding well ---Unable to discharge patient home due to logistical challenges with completing IV Invanz  antibiotic at home, so patient will stay here through 08/14/2024 to complete IV Invanz  antibiotic as recommended by ID physician  Assessment & Plan:   Principal Problem:   Sepsis secondary to UTI [facility]) Active Problems:   Prostate nodule   History of ESBL E. coli infection   Hypokalemia   Bacteremia   Sepsis secondary to ESBL bacteremia - Recent history of ESBL UTI 07/06/2024 - Urine culture 9/1 with insignificant growth but had received abx - CT abdomen benign, no signs pyelo, abscess, etc. --Blood cultures from 08/07/2024  (2/2) with ESBL E. coli -- Last dose of IV Invanz  08/14/2024--logistically unable to do OPAT at home - Repeat blood cultures from 08/10/2024 NGTD WBC 11.5 >>9.8>>4.5  BPH Reports no issues with urination, no obstruction seen on CT -c/n Flomax  - f/u urology ([facility]) after d/c  Hyperlipidemia -c/n  Lipitor  GERD---c/n Protonix    Chronic anemia--Hgb stable above 11 - No bleeding  concerns  AKI----acute kidney injury - - creatinine on admission=1.32  ,  baseline creatinine = 1.0   ,  creatinine is now=1.07  ,  --Renal function normalized with hydration --renally adjust medications, avoid nephrotoxic agents / dehydration  / hypotension   Disposition:- --Unable to discharge patient home due to logistical challenges with completing IV Invanz  antibiotic at home, so patient will stay here through 08/14/2024 to complete IV Invanz  antibiotic as recommended by ID physician  DVT prophylaxis:  enoxaparin  (LOVENOX ) injection 40 mg Start: 08/08/24 1000  Code Status: Full code Family Communication: daughter crystal previously updated  disposition Plan: Home on 08/14/24 after completion of IV INvanz  Status is: Inpatient Remains inpatient appropriate because: IV antibiotics  Antimicrobials:  Anti-infectives (From admission, onward)    Start     Dose/Rate Route Frequency Ordered Stop   08/11/24 1000  ertapenem  (INVANZ ) 1 g in sodium chloride  0.9 % 100 mL IVPB        1 g 200 mL/hr over 30 Minutes Intravenous Every 24 hours 08/11/24 0755 08/15/24 0959   08/08/24 0200  meropenem  (MERREM ) 1 g in sodium chloride  0.9 % 100 mL IVPB  Status:  Discontinued        1 g 200 mL/hr over 30 Minutes Intravenous Every 8 hours 08/08/24 0018 08/11/24 0755   08/07/24 2200  piperacillin -tazobactam (ZOSYN ) IVPB 3.375 g  Status:  Discontinued        3.375 g 100 mL/hr over 30 Minutes Intravenous Every 8 hours 08/07/24 2053 08/07/24 2126   08/07/24 2200  piperacillin -tazobactam (ZOSYN ) IVPB 3.375 g  Status:  Discontinued  3.375 g 12.5 mL/hr over 240 Minutes Intravenous Every 8 hours 08/07/24 2126 08/08/24 0007        Objective: Vitals:   08/12/24 1403 08/12/24 2115 08/13/24 0206 08/13/24 0540  BP: 97/70 103/72 116/74 100/71  Pulse: 73 77 79 80  Resp: 17 18  18   Temp: 98.8 F (37.1 C) 98.5 F (36.9 C)  98.8 F (37.1 C)  TempSrc: Oral Oral  Oral  SpO2: 97% 96% 99% 98%  Weight:       Height:        Intake/Output Summary (Last 24 hours) at 08/13/2024 1237 Last data filed at 08/13/2024 0546 Gross per 24 hour  Intake 480 ml  Output 1175 ml  Net -695 ml   Filed Weights   08/07/24 2100 08/08/24 0139  Weight: 70.3 kg 68.9 kg    Physical Exam Gen:- Awake Alert, in no acute distress , frail-appearing HEENT:- [place].AT, No sclera icterus Neck-Supple Neck,No JVD,.  Lungs-  CTAB , fair air movement bilaterally  CV- S1, S2 normal, RRR Abd-  +ve B.Sounds, Abd Soft, No tenderness, no CVA area tenderness Extremity/Skin:- No  edema,   good pedal pulses  Psych-affect is appropriate, oriented x3 Neuro-no new focal deficits, no tremors   Data Reviewed: I have personally reviewed following labs and imaging studies  CBC: Recent Labs  Lab 08/07/24 2107 08/08/24 0444 08/09/24 0420 08/13/24 0412  WBC 11.0* 11.5* 9.8 4.5  HGB 11.7* 10.4* 11.4* 11.4*  HCT 35.8* 32.0* 35.0* 35.3*  MCV 85.9 86.3 86.6 85.9  PLT 168 144* 166 224   Basic Metabolic Panel: Recent Labs  Lab 08/07/24 2107 08/08/24 0444 08/09/24 0420 08/13/24 0412  NA 136 138 138 135  K 3.3* 3.4* 3.9 3.8  CL 102 103 107 105  CO2 25 25 26 24   GLUCOSE 135* 163* 134* 107*  BUN 16 16 14 12   CREATININE 1.32* 1.23 1.19 1.07  CALCIUM  10.3 10.2 10.2 9.9  MG  --  1.7  --   --   PHOS  --   --   --  2.4*   GFR: Estimated Creatinine Clearance: 61.7 mL/min (by C-G formula based on SCr of 1.07 mg/dL). Liver Function Tests: Recent Labs  Lab 08/07/24 2107 08/13/24 0412  AST 36  --   ALT 25  --   ALKPHOS 71  --   BILITOT 0.7  --   PROT 6.9  --   ALBUMIN 3.3* 2.6*   Recent Labs  Lab 08/07/24 2107  LIPASE 28   Sepsis Labs: Recent Labs  Lab 08/07/24 2107  LATICACIDVEN 1.4    Recent Results (from the past 240 hours)  Culture, blood (routine x 2)     Status: Abnormal   Collection Time: 08/07/24  9:07 PM   Specimen: BLOOD  Result Value Ref Range Status   Specimen Description   Final    BLOOD LEFT  ANTECUBITAL Performed at [facility], [address], [place], [place] [address]    Special Requests   Final    BOTTLES DRAWN AEROBIC AND ANAEROBIC Blood Culture adequate volume Performed at [facility], [address], [place], [place] [address]    Culture  Setup Time   Final    IN BOTH AEROBIC AND ANAEROBIC BOTTLES GRAM NEGATIVE RODS Gram Stain Report Called to,Read Back By and Verified With: M HOWERTON AT 0958 ON 09.02.25 BY ADGER J  CRITICAL RESULT CALLED TO, READ BACK BY AND VERIFIED WITH: RN CANDIE GULLY [phone] @ 1958 FH Performed at  [facility], [address], [place], [place] [address]    Culture (A)  Final    ESCHERICHIA COLI Confirmed Extended Spectrum Beta-Lactamase Producer (ESBL).  In bloodstream infections from ESBL organisms, carbapenems are preferred over piperacillin /tazobactam. They are shown to have a lower risk of mortality.    Report Status 08/10/2024 FINAL  Final   Organism ID, Bacteria ESCHERICHIA COLI  Final      Susceptibility   Escherichia coli - MIC*    AMPICILLIN >=32 RESISTANT Resistant     CEFAZOLIN (NON-URINE) >=32 RESISTANT Resistant     CEFEPIME 16 RESISTANT Resistant     ERTAPENEM  <=0.12 SENSITIVE Sensitive     CEFTRIAXONE  >=64 RESISTANT Resistant     CIPROFLOXACIN  >=4 RESISTANT Resistant     GENTAMICIN  <=1 SENSITIVE Sensitive     MEROPENEM  <=0.25 SENSITIVE Sensitive     TRIMETH/SULFA >=320 RESISTANT Resistant     AMPICILLIN/SULBACTAM 4 SENSITIVE Sensitive     PIP/TAZO Value in next row Sensitive ug/mL     <=4 SENSITIVEThis is a modified FDA-approved test that has been validated and its performance characteristics determined by the reporting laboratory.  This laboratory is certified under the Clinical Laboratory Improvement Amendments CLIA as qualified to perform high complexity clinical laboratory testing.    * ESCHERICHIA COLI  Culture, blood (routine x 2)     Status: Abnormal   Collection Time: 08/07/24  9:07 PM   Specimen: BLOOD LEFT ARM   Result Value Ref Range Status   Specimen Description   Final    BLOOD LEFT ARM Performed at [facility], [address] [place], [place] [address]    Special Requests   Final    BOTTLES DRAWN AEROBIC AND ANAEROBIC Blood Culture adequate volume Performed at [facility], [address] [place], [place] [address]    Culture  Setup Time   Final    IN BOTH AEROBIC AND ANAEROBIC BOTTLES GRAM NEGATIVE RODS Gram Stain Report Called to,Read Back By and Verified With: W.EARLY AT 0916 ON 09.02.25 BY ADGER J  GRAM STAIN REVIEWED-AGREE WITH RESULT DRT    Culture (A)  Final    ESCHERICHIA COLI SUSCEPTIBILITIES PERFORMED ON PREVIOUS CULTURE WITHIN THE LAST 5 DAYS. Performed at [facility], [address], [place] , [place] [address]    Report Status 08/10/2024 FINAL  Final  Blood Culture ID Panel (Reflexed)     Status: Abnormal   Collection Time: 08/07/24  9:07 PM  Result Value Ref Range Status   Enterococcus faecalis NOT DETECTED NOT DETECTED Final   Enterococcus Faecium NOT DETECTED NOT DETECTED Final   Listeria monocytogenes NOT DETECTED NOT DETECTED Final   Staphylococcus species NOT DETECTED NOT DETECTED Final   Staphylococcus aureus (BCID) NOT DETECTED NOT DETECTED Final   Staphylococcus epidermidis NOT DETECTED NOT DETECTED Final   Staphylococcus lugdunensis NOT DETECTED NOT DETECTED Final   Streptococcus species NOT DETECTED NOT DETECTED Final   Streptococcus agalactiae NOT DETECTED NOT DETECTED Final   Streptococcus pneumoniae NOT DETECTED NOT DETECTED Final   Streptococcus pyogenes NOT DETECTED NOT DETECTED Final   A.calcoaceticus-baumannii NOT DETECTED NOT DETECTED Final   Bacteroides fragilis NOT DETECTED NOT DETECTED Final   Enterobacterales DETECTED (A) NOT DETECTED Final    Comment: Enterobacterales represent a large order of gram negative bacteria, not a single organism. CRITICAL RESULT CALLED TO, READ BACK BY AND VERIFIED WITH: RN SSABRA GULLY [phone] @ 1958 FH     Enterobacter cloacae complex NOT DETECTED NOT DETECTED  Final   Escherichia coli DETECTED (A) NOT DETECTED Final    Comment: CRITICAL RESULT CALLED TO, READ BACK BY AND VERIFIED WITH: RN SSABRA GULLY [phone] @ 1958 FH    Klebsiella aerogenes NOT DETECTED NOT DETECTED Final   Klebsiella oxytoca NOT DETECTED NOT DETECTED Final   Klebsiella pneumoniae NOT DETECTED NOT DETECTED Final   Proteus species NOT DETECTED NOT DETECTED Final   Salmonella species NOT DETECTED NOT DETECTED Final   Serratia marcescens NOT DETECTED NOT DETECTED Final   Haemophilus influenzae NOT DETECTED NOT DETECTED Final   Neisseria meningitidis NOT DETECTED NOT DETECTED Final   Pseudomonas aeruginosa NOT DETECTED NOT DETECTED Final   Stenotrophomonas maltophilia NOT DETECTED NOT DETECTED Final   Candida albicans NOT DETECTED NOT DETECTED Final   Candida auris NOT DETECTED NOT DETECTED Final   Candida glabrata NOT DETECTED NOT DETECTED Final   Candida krusei NOT DETECTED NOT DETECTED Final   Candida parapsilosis NOT DETECTED NOT DETECTED Final   Candida tropicalis NOT DETECTED NOT DETECTED Final   Cryptococcus neoformans/gattii NOT DETECTED NOT DETECTED Final   CTX-M ESBL DETECTED (A) NOT DETECTED Final    Comment: CRITICAL RESULT CALLED TO, READ BACK BY AND VERIFIED WITH: RN CANDIE GULLY [phone] @ 1958 FH (NOTE) Extended spectrum beta-lactamase detected. Recommend a carbapenem as initial therapy.      Carbapenem resistance IMP NOT DETECTED NOT DETECTED Final   Carbapenem resistance KPC NOT DETECTED NOT DETECTED Final   Carbapenem resistance NDM NOT DETECTED NOT DETECTED Final   Carbapenem resist OXA 48 LIKE NOT DETECTED NOT DETECTED Final   Carbapenem resistance VIM NOT DETECTED NOT DETECTED Final    Comment: Performed at [facility], [address] [place], [place] [address]  Urine Culture (for pregnant, neutropenic or urologic patients or patients with an indwelling urinary catheter)     Status: Abnormal    Collection Time: 08/07/24 11:05 PM   Specimen: Urine, Clean Catch  Result Value Ref Range Status   Specimen Description   Final    URINE, CLEAN CATCH Performed at [facility], [address] [place], [place] [address]    Special Requests   Final    NONE Performed at [facility], [address] [place], [place] [address]    Culture (A)  Final    <10,000 COLONIES/mL INSIGNIFICANT GROWTH Performed at [facility], [address], [place], [place] [address]    Report Status 08/09/2024 FINAL  Final  Culture, blood (Routine X 2) w Reflex to ID Panel     Status: None (Preliminary result)   Collection Time: 08/10/24  3:14 PM   Specimen: BLOOD  Result Value Ref Range Status   Specimen Description BLOOD BLOOD LEFT ARM  Final   Special Requests   Final    Blood Culture adequate volume BOTTLES DRAWN AEROBIC AND ANAEROBIC   Culture   Final    NO GROWTH 3 DAYS Performed at [facility], [address], [place], [place] [address]    Report Status PENDING  Incomplete  Culture, blood (Routine X 2) w Reflex to ID Panel     Status: None (Preliminary result)   Collection Time: 08/10/24  3:14 PM   Specimen: BLOOD  Result Value Ref Range Status   Specimen Description BLOOD RIGHT ANTECUBITAL  Final   Special Requests   Final    BOTTLES DRAWN AEROBIC AND ANAEROBIC Blood Culture adequate volume   Culture   Final    NO GROWTH 3 DAYS Performed  at [facility], [address] [place], [place] [address]    Report Status PENDING  Incomplete    Scheduled Meds:  atorvastatin   40 mg Oral QHS   brimonidine   1 drop Both Eyes BID   enoxaparin  (LOVENOX ) injection  40 mg Subcutaneous Q24H   latanoprost   1 drop Both Eyes QHS   pantoprazole   40 mg Oral Daily   phosphorus  500 mg Oral TID   senna-docusate  2 tablet Oral BID   tamsulosin   0.8 mg Oral Daily   Continuous Infusions:  ertapenem  1 g (08/13/24 1215)    LOS: 5 days   Rendall Carwin, MD [facility] 08/13/2024, 12:37 PM    Available via Epic secure chat 7am-7pm After these hours, please refer to coverage provider listed on [email or web address]

## 2024-08-14 DIAGNOSIS — N390 Urinary tract infection, site not specified: Secondary | ICD-10-CM | POA: Diagnosis not present

## 2024-08-14 DIAGNOSIS — A419 Sepsis, unspecified organism: Secondary | ICD-10-CM | POA: Diagnosis not present

## 2024-08-14 MED ORDER — SENNOSIDES-DOCUSATE SODIUM 8.6-50 MG PO TABS: 2.0000 | ORAL_TABLET | Freq: Every day | ORAL | 3 refills | Status: DC

## 2024-08-14 MED ORDER — ATORVASTATIN CALCIUM 40 MG PO TABS: 40.0000 mg | ORAL_TABLET | Freq: Every day | ORAL | 5 refills | Status: AC

## 2024-08-14 MED ORDER — TAMSULOSIN HCL 0.4 MG PO CAPS: 0.4000 mg | ORAL_CAPSULE | Freq: Every day | ORAL | 5 refills | Status: AC

## 2024-08-14 MED ORDER — POLYETHYLENE GLYCOL 3350 17 G PO PACK: 17.0000 g | PACK | ORAL | 3 refills | Status: AC

## 2024-08-14 MED ORDER — OMEPRAZOLE 40 MG PO CPDR: 40.0000 mg | DELAYED_RELEASE_CAPSULE | Freq: Every day | ORAL | 3 refills | Status: DC

## 2024-08-14 NOTE — Discharge Instructions (Signed)
 1)Avoid ibuprofen /Advil /Aleve /Motrin Josefine Powders/Naproxen /BC powders/Meloxicam/Diclofenac/Indomethacin and other Nonsteroidal anti-inflammatory medications as these will make you more likely to bleed and can cause stomach ulcers, can also cause Kidney problems.   2)Follow up with [facility], [facility] Provider) within 1 week--

## 2024-08-14 NOTE — Discharge Summary (Signed)
 Daniel Duffy, is a 73 y.o. male  DOB 07-21-1951  MRN [id number].  Admission date:  08/07/2024  Admitting Physician  Evalene GORMAN Sprinkles, MD  Discharge Date:  08/14/2024   Primary MD  Pllc, [facility]  Recommendations for primary care physician for things to follow:  1)Avoid ibuprofen /Advil /Aleve /Motrin Josefine Powders/Naproxen /BC powders/Melxicam/Diclofenac/Indomethacin and other Nonsteroidal anti-inflammatory medications as these will make you more likely to bleed and can cause stomach ulcers, can also cause Kidney problems.   2)Follow up with Pllc, [facility] (Primary Care Provider) within 1 week  Admission Diagnosis  Hypokalemia [E87.6] Flank pain [R10.9] Fever [R50.9]  Discharge Diagnosis  Hypokalemia [E87.6] Flank pain [R10.9] Fever [R50.9]    Principal Problem:   Sepsis secondary to UTI [facility]) Active Problems:   Prostate nodule   History of ESBL E. coli infection   Hypokalemia   Bacteremia      Past Medical History:  Diagnosis Date   Abnormal weight loss    Acid reflux    Acute prostatitis    Acute sinusitis, unspecified    Chronic idiopathic constipation    Colon polyps    Enlarged prostate    Hyperlipidemia, unspecified    Incisional hernia without obstruction or gangrene    Lumbago    Nonspecific elevation of levels of transaminase and lactic acid dehydrogenase (LDH)    Other male erectile dysfunction    Other obstructive and reflux uropathy    Radiculopathy, lumbar region    Rash and other nonspecific skin eruption    Reflux esophagitis     Past Surgical History:  Procedure Laterality Date   BIOPSY  04/05/2023   Procedure: BIOPSY;  Surgeon: Shaaron Lamar HERO, MD;  [facility];  Service: Endoscopy;;   COLONOSCOPY  12/29/2005   MFM:Pwuzmwjo hemorrhoids, anal papilla/Diminutive polyp on a stalk at 10 cm/otherwise normal rectum and colon, PATH:  hyperplastic polyp   COLONOSCOPY N/A 04/10/2013   RMR: tubular adenoma: next TCS 04/2018   COLONOSCOPY WITH PROPOFOL  N/A 02/10/2018   Surgeon: Shaaron Lamar HERO, MD;  pancolonic diverticulosis, otherwise normal exam.  Recommended repeat colonoscopy in 5 years for surveillance due to history of colon polyps.   COLONOSCOPY WITH PROPOFOL  N/A 04/05/2023   Procedure: COLONOSCOPY WITH PROPOFOL ;  Surgeon: Shaaron Lamar HERO, MD;  [facility];  Service: Endoscopy;  Laterality: N/A;  11:15 am   ESOPHAGOGASTRODUODENOSCOPY  12/29/2005   MFM:wnwrmpuprjo Schatzki's ring, small hiatal hernia, normal gastric mucosa, normal D1 and D2.    ESOPHAGOGASTRODUODENOSCOPY (EGD) WITH PROPOFOL  N/A 04/05/2023   Procedure: ESOPHAGOGASTRODUODENOSCOPY (EGD) WITH PROPOFOL ;  Surgeon: Shaaron Lamar HERO, MD;  Location[facility];  Service: Endoscopy;  Laterality: N/A;   HAND SURGERY     bilateral, got caught in a machine, two different occasions, right hand originally, than left hand. Left hand with skin graft   SPINAL CORD DECOMPRESSION       HPI  from the history and physical done on the day of admission:   Chief Complaint: RLQ abdominal pain, sweats  HPI: Daniel Duffy is a 73 y.o. male with medical history significant for hyperlipidemia, enlarged prostate, and UTI due to ESBL E. coli in July 2025 who now presents with right lower quadrant abdominal pain and sweats.   Patient reports developing diaphoresis yesterday and then right lower quadrant abdominal pain today.  He denies any associated dysuria, nausea, vomiting, chest pain, or shortness of breath.   ED Course: Upon arrival to the ED, patient is found to be febrile to 39.6 C and saturating mid 90s on room air with normal RR, normal HR, and stable BP.  Labs are most notable for potassium 3.3, creatinine 1.32, WBC 11,000, and lactic acid 1.4.  There are no acute findings on chest x-ray or on CT of the abdomen and pelvis.   Blood cultures were collected and  the patient was given 2 L LR, oral potassium, and Zosyn  in the ED.   Review of Systems:  All other systems reviewed and apart from HPI, are negative.    Hospital Course:   Brief Narrative:  Daniel Duffy is a 73yo with h/o HLD, BPH, and ESBL UTI in 06/2024 who presented with RLQ abdominal pan and sweats.  SIRS physiology with fever, tachycardia, tachypnea, leukocytosis. UA suggestive of UTI, + bacteremia with GNR.  Sepsis ruled in.  Treated with Zosyn  -> meropenem --Invanz  -Blood cultures from 08/07/2024  (2/2) with ESBL E. coli -- Last dose of IV Invanz  on 08/14/2024   Assessment and Plan: 1)Sepsis secondary to ESBL bacteremia - Recent history of ESBL UTI 07/06/2024 - Urine culture 9/1 with insignificant growth but had received abx - CT abdomen benign, no signs pyelo, abscess, etc. --Blood cultures from 08/07/2024  (2/2) with ESBL E. coli -- Completed IV Invanz  on 08/14/2024--  - Repeat blood cultures from 08/10/2024 NGTD WBC 11.5 >>9.8>>4.5 - Sepsis pathophysiology has resolved -ID consult appreciated   2)BPH Reports no issues with urination, no obstruction seen on CT -c/n Flomax  and finasteride - f/u urology (Daniel Duffy) after d/c   3)Hyperlipidemia -c/n  Lipitor   4)GERD---c/n Protonix     5)Chronic anemia--Hgb stable above 11 - No bleeding concerns   6)AKI----acute kidney injury - - creatinine on admission=1.32  ,  baseline creatinine = 1.0   ,  creatinine is now=1.07  ,  --Renal function normalized with hydration  Discharge Condition: stable   Follow UP   Follow-up Information     Pllc, [employer]. Schedule an appointment as soon as possible for a visit in 1 week(s).   Specialty: Family Medicine Contact information: [address] [place] [address] [phone]                 Consults obtained - ID  Diet and Activity recommendation:  As advised  Discharge Instructions   Discharge Instructions     Call MD for:  difficulty  breathing, headache or visual disturbances   Complete by: As directed    Call MD for:  persistant dizziness or light-headedness   Complete by: As directed    Call MD for:  persistant nausea and vomiting   Complete by: As directed    Call MD for:  temperature >100.4   Complete by: As directed    Diet - low sodium heart healthy   Complete by: As directed    Discharge instructions   Complete by: As directed    1)Avoid ibuprofen /Advil /Aleve /Motrin /Goody Powders/Naproxen /BC powders/Meloxicam/Diclofenac/Indomethacin and other Nonsteroidal anti-inflammatory medications as these will make you more likely to bleed and can cause stomach  ulcers, can also cause Kidney problems.   2)Follow up with Pll[facility], [facility] Provider) within 1 week   Increase activity slowly   Complete by: As directed          Discharge Medications     Allergies as of 08/14/2024       Reactions   Nitrates, Organic Other (See Comments)   Could not control of body        Medication List     TAKE these medications    atorvastatin  40 MG tablet Commonly known as: LIPITOR Take 1 tablet (40 mg total) by mouth at bedtime.   brimonidine  0.2 % ophthalmic solution Commonly known as: ALPHAGAN  Place 1 drop into both eyes 2 (two) times daily.   ipratropium 0.03 % nasal spray Commonly known as: ATROVENT  Place 2 sprays into both nostrils 3 (three) times daily.   latanoprost  0.005 % ophthalmic solution Commonly known as: XALATAN  Place 1 drop into both eyes at bedtime.   levocetirizine 5 MG tablet Commonly known as: XYZAL  Take 5 mg by mouth daily.   multivitamins ther. w/minerals Tabs tablet Take 1 tablet by mouth daily.   omeprazole  40 MG capsule Commonly known as: PRILOSEC Take 1 capsule (40 mg total) by mouth daily. What changed: See the new instructions.   polyethylene glycol 17 g packet Commonly known as: MiraLax  Take 17 g by mouth every Tuesday, Thursday, Saturday, and  Sunday. Start taking on: August 15, 2024 What changed: when to take this   senna-docusate 8.6-50 MG tablet Commonly known as: Senokot-S Take 2 tablets by mouth at bedtime.   tamsulosin  0.4 MG Caps capsule Commonly known as: FLOMAX  Take 1 capsule (0.4 mg total) by mouth daily after supper. What changed: when to take this        Major procedures and Radiology Reports - PLEASE review detailed and final reports for all details, in brief -   CT ABDOMEN PELVIS W CONTRAST Result Date: 08/07/2024 CLINICAL DATA:  Right lower quadrant pain with fever. EXAM: CT ABDOMEN AND PELVIS WITH CONTRAST TECHNIQUE: Multidetector CT imaging of the abdomen and pelvis was performed using the standard protocol following bolus administration of intravenous contrast. RADIATION DOSE REDUCTION: This exam was performed according to the departmental dose-optimization program which includes automated exposure control, adjustment of the mA and/or kV according to patient size and/or use of iterative reconstruction technique. CONTRAST:  OMNIPAQUE  IOHEXOL  300 MG/ML  SOLN COMPARISON:  CT abdomen and pelvis 07/06/2024. FINDINGS: Lower chest: No acute abnormality. Hepatobiliary: Gallbladder is absent. There is no biliary ductal dilatation. There are numerous cysts scattered throughout the liver and hypodensities which are too small to characterize measuring up to 2 cm, unchanged from prior. Pancreas: Unremarkable. No pancreatic ductal dilatation or surrounding inflammatory changes. Spleen: Normal in size without focal abnormality. Adrenals/Urinary Tract: Adrenal glands are unremarkable. Kidneys are normal, without renal calculi, focal lesion, or hydronephrosis. Bladder is unremarkable. Stomach/Bowel: Stomach is within normal limits. The appendix is not seen. There is a large amount of stool throughout the entire colon. No evidence of bowel wall thickening, distention, or inflammatory changes. Vascular/Lymphatic: Aortic  atherosclerosis. No enlarged abdominal or pelvic lymph nodes. Reproductive: Prostate gland is enlarged measuring 5.5 cm in transverse dimension. There is a hypodense nodular area in the prostate on left measuring 11 mm. Other: There is a small left inguinal hernia containing nondilated bowel. There is no ascites. Musculoskeletal: There are degenerative changes of the lower lumbar spine. IMPRESSION: 1. No acute localizing  process in the abdomen or pelvis. The appendix is not definitely visualized. 2. Large amount of stool throughout the colon. 3. Small left inguinal hernia containing nondilated bowel. 4. Prostatomegaly with 11 mm hypodense nodule in the left. Correlate with PSA. Aortic Atherosclerosis (ICD10-I70.0). Electronically Signed   By: Greig Pique M.D.   On: 08/07/2024 22:53   DG Chest Portable 1 View Result Date: 08/07/2024 CLINICAL DATA:  Sepsis EXAM: PORTABLE CHEST 1 VIEW COMPARISON:  None Available. FINDINGS: The heart size and mediastinal contours are within normal limits. Both lungs are clear. The visualized skeletal structures are unremarkable. IMPRESSION: No active disease. Electronically Signed   By: Dorethia Molt M.D.   On: 08/07/2024 21:55    Micro Results   Recent Results (from the past 240 hours)  Culture, blood (routine x 2)     Status: Abnormal   Collection Time: 08/07/24  9:07 PM   Specimen: BLOOD  Result Value Ref Range Status   Specimen Description   Final    BLOOD LEFT ANTECUBITAL Performed at [facility], [address] [place], [place] [address]    Special Requests   Final    BOTTLES DRAWN AEROBIC AND ANAEROBIC Blood Culture adequate volume Performed at [facility], [address], [place], [place] [address]    Culture  Setup Time   Final    IN BOTH AEROBIC AND ANAEROBIC BOTTLES GRAM NEGATIVE RODS Gram Stain Report Called to,Read Back By and Verified With: M HOWERTON AT 0958 ON 09.02.25 BY ADGER J  CRITICAL RESULT CALLED TO, READ BACK BY AND VERIFIED WITH: RN  CANDIE GULLY [phone] @ 1958 FH Performed at [facility], [address] [place], [place] [address]    Culture (A)  Final    ESCHERICHIA COLI Confirmed Extended Spectrum Beta-Lactamase Producer (ESBL).  In bloodstream infections from ESBL organisms, carbapenems are preferred over piperacillin /tazobactam. They are shown to have a lower risk of mortality.    Report Status 08/10/2024 FINAL  Final   Organism ID, Bacteria ESCHERICHIA COLI  Final      Susceptibility   Escherichia coli - MIC*    AMPICILLIN >=32 RESISTANT Resistant     CEFAZOLIN (NON-URINE) >=32 RESISTANT Resistant     CEFEPIME 16 RESISTANT Resistant     ERTAPENEM  <=0.12 SENSITIVE Sensitive     CEFTRIAXONE  >=64 RESISTANT Resistant     CIPROFLOXACIN  >=4 RESISTANT Resistant     GENTAMICIN  <=1 SENSITIVE Sensitive     MEROPENEM  <=0.25 SENSITIVE Sensitive     TRIMETH/SULFA >=320 RESISTANT Resistant     AMPICILLIN/SULBACTAM 4 SENSITIVE Sensitive     PIP/TAZO Value in next row Sensitive ug/mL     <=4 SENSITIVEThis is a modified FDA-approved test that has been validated and its performance characteristics determined by the reporting laboratory.  This laboratory is certified under the Clinical Laboratory Improvement Amendments CLIA as qualified to perform high complexity clinical laboratory testing.    * ESCHERICHIA COLI  Culture, blood (routine x 2)     Status: Abnormal   Collection Time: 08/07/24  9:07 PM   Specimen: BLOOD LEFT ARM  Result Value Ref Range Status   Specimen Description   Final    BLOOD LEFT ARM Performed at [facility], [address] [place], [place] [address]    Special Requests   Final    BOTTLES DRAWN AEROBIC AND ANAEROBIC Blood Culture adequate volume Performed at [facility], [address] [place], [place] [address]    Culture  Setup Time  Final    IN BOTH AEROBIC AND ANAEROBIC BOTTLES GRAM NEGATIVE RODS Gram Stain Report Called to,Read Back By and Verified With: W.EARLY AT 0916 ON 09.02.25  BY ADGER J  GRAM STAIN REVIEWED-AGREE WITH RESULT DRT    Culture (A)  Final    ESCHERICHIA COLI SUSCEPTIBILITIES PERFORMED ON PREVIOUS CULTURE WITHIN THE LAST 5 DAYS. Performed at [facility], [address] [place], [place] [address]    Report Status 08/10/2024 FINAL  Final  Blood Culture ID Panel (Reflexed)     Status: Abnormal   Collection Time: 08/07/24  9:07 PM  Result Value Ref Range Status   Enterococcus faecalis NOT DETECTED NOT DETECTED Final   Enterococcus Faecium NOT DETECTED NOT DETECTED Final   Listeria monocytogenes NOT DETECTED NOT DETECTED Final   Staphylococcus species NOT DETECTED NOT DETECTED Final   Staphylococcus aureus (BCID) NOT DETECTED NOT DETECTED Final   Staphylococcus epidermidis NOT DETECTED NOT DETECTED Final   Staphylococcus lugdunensis NOT DETECTED NOT DETECTED Final   Streptococcus species NOT DETECTED NOT DETECTED Final   Streptococcus agalactiae NOT DETECTED NOT DETECTED Final   Streptococcus pneumoniae NOT DETECTED NOT DETECTED Final   Streptococcus pyogenes NOT DETECTED NOT DETECTED Final   A.calcoaceticus-baumannii NOT DETECTED NOT DETECTED Final   Bacteroides fragilis NOT DETECTED NOT DETECTED Final   Enterobacterales DETECTED (A) NOT DETECTED Final    Comment: Enterobacterales represent a large order of gram negative bacteria, not a single organism. CRITICAL RESULT CALLED TO, READ BACK BY AND VERIFIED WITH: RN S. DESIDERIO [phone] @ 1958 FH    Enterobacter cloacae complex NOT DETECTED NOT DETECTED Final   Escherichia coli DETECTED (A) NOT DETECTED Final    Comment: CRITICAL RESULT CALLED TO, READ BACK BY AND VERIFIED WITH: RN SSABRA DESIDERIO [phone] @ 1958 FH    Klebsiella aerogenes NOT DETECTED NOT DETECTED Final   Klebsiella oxytoca NOT DETECTED NOT DETECTED Final   Klebsiella pneumoniae NOT DETECTED NOT DETECTED Final   Proteus species NOT DETECTED NOT DETECTED Final   Salmonella species NOT DETECTED NOT DETECTED Final   Serratia marcescens  NOT DETECTED NOT DETECTED Final   Haemophilus influenzae NOT DETECTED NOT DETECTED Final   Neisseria meningitidis NOT DETECTED NOT DETECTED Final   Pseudomonas aeruginosa NOT DETECTED NOT DETECTED Final   Stenotrophomonas maltophilia NOT DETECTED NOT DETECTED Final   Candida albicans NOT DETECTED NOT DETECTED Final   Candida auris NOT DETECTED NOT DETECTED Final   Candida glabrata NOT DETECTED NOT DETECTED Final   Candida krusei NOT DETECTED NOT DETECTED Final   Candida parapsilosis NOT DETECTED NOT DETECTED Final   Candida tropicalis NOT DETECTED NOT DETECTED Final   Cryptococcus neoformans/gattii NOT DETECTED NOT DETECTED Final   CTX-M ESBL DETECTED (A) NOT DETECTED Final    Comment: CRITICAL RESULT CALLED TO, READ BACK BY AND VERIFIED WITH: RN CANDIE DESIDERIO [phone] @ 1958 FH (NOTE) Extended spectrum beta-lactamase detected. Recommend a carbapenem as initial therapy.      Carbapenem resistance IMP NOT DETECTED NOT DETECTED Final   Carbapenem resistance KPC NOT DETECTED NOT DETECTED Final   Carbapenem resistance NDM NOT DETECTED NOT DETECTED Final   Carbapenem resist OXA 48 LIKE NOT DETECTED NOT DETECTED Final   Carbapenem resistance VIM NOT DETECTED NOT DETECTED Final    Comment: Performed at [facility], [address] [place], [place] [address]  Urine Culture (for pregnant, neutropenic or urologic patients or patients with an indwelling urinary catheter)     Status: Abnormal  Collection Time: 08/07/24 11:05 PM   Specimen: Urine, Clean Catch  Result Value Ref Range Status   Specimen Description   Final    URINE, CLEAN CATCH Performed at [facility], [address], [place], [place] [address]    Special Requests   Final    NONE Performed at [facility], [address], [place], [place] [address]    Culture (A)  Final    <10,000 COLONIES/mL INSIGNIFICANT GROWTH Performed at [facility], [address], [place], [place] [address]    Report Status 08/09/2024 FINAL   Final  Culture, blood (Routine X 2) w Reflex to ID Panel     Status: None (Preliminary result)   Collection Time: 08/10/24  3:14 PM   Specimen: BLOOD  Result Value Ref Range Status   Specimen Description BLOOD BLOOD LEFT ARM  Final   Special Requests   Final    Blood Culture adequate volume BOTTLES DRAWN AEROBIC AND ANAEROBIC   Culture   Final    NO GROWTH 4 DAYS Performed at [facility], [address] [place], [place] [address]    Report Status PENDING  Incomplete  Culture, blood (Routine X 2) w Reflex to ID Panel     Status: None (Preliminary result)   Collection Time: 08/10/24  3:14 PM   Specimen: BLOOD  Result Value Ref Range Status   Specimen Description BLOOD RIGHT ANTECUBITAL  Final   Special Requests   Final    BOTTLES DRAWN AEROBIC AND ANAEROBIC Blood Culture adequate volume   Culture   Final    NO GROWTH 4 DAYS Performed at [facility], [address], [place], [place] [address]    Report Status PENDING  Incomplete   Today   Subjective    Daniel Duffy today has no new complaints  No fever  Or chills   No Nausea, Vomiting or Diarrhea          Patient has been seen and examined prior to discharge   Objective   Blood pressure 107/69, pulse 68, temperature 98.4 F (36.9 C), temperature source Oral, resp. rate 17, height 5' 11 (1.803 m), weight 68.9 kg, SpO2 97%.   Intake/Output Summary (Last 24 hours) at 08/14/2024 0935 Last data filed at 08/14/2024 0426 Gross per 24 hour  Intake 480 ml  Output 400 ml  Net 80 ml    Exam Gen:- Awake Alert, no acute distress  HEENT:- [place].AT, No sclera icterus Neck-Supple Neck,No JVD,.  Lungs-  CTAB , good air movement bilaterally CV- S1, S2 normal, regular Abd-  +ve B.Sounds, Abd Soft, No tenderness,  No CVA tenderness  Extremity/Skin:- No  edema,   good pulses Psych-affect is appropriate, oriented x3 Neuro-no new focal deficits, no tremors  MSK---prior amputation of Rt 2nd finger   Data Review   CBC w Diff:   Lab Results  Component Value Date   WBC 4.5 08/13/2024   HGB 11.4 (L) 08/13/2024   HCT 35.3 (L) 08/13/2024   PLT 224 08/13/2024   LYMPHOPCT 14 07/06/2024   MONOPCT 11 07/06/2024   EOSPCT 1 07/06/2024   BASOPCT 0 07/06/2024   CMP:  Lab Results  Component Value Date   NA 135 08/13/2024   NA 138 05/23/2019   K 3.8 08/13/2024   CL 105 08/13/2024   CO2 24 08/13/2024   BUN 12 08/13/2024   BUN 10 05/23/2019   CREATININE 1.07 08/13/2024   CREATININE 0.97 08/10/2017   GLU 110 05/23/2019   PROT  6.9 08/07/2024   ALBUMIN 2.6 (L) 08/13/2024   BILITOT 0.7 08/07/2024   ALKPHOS 71 08/07/2024   AST 36 08/07/2024   ALT 25 08/07/2024   Total Discharge time is about 33 minutes  Rendall Carwin M.D on 08/14/2024 at 9:35 AM  Go to [email or web address] -  for contact info  [facility] - Office  [phone]

## 2024-08-15 LAB — CULTURE, BLOOD (ROUTINE X 2)
Culture: NO GROWTH
Culture: NO GROWTH
Special Requests: ADEQUATE
Special Requests: ADEQUATE

## 2024-08-18 DIAGNOSIS — Z6821 Body mass index (BMI) 21.0-21.9, adult: Secondary | ICD-10-CM | POA: Diagnosis not present

## 2024-08-22 DIAGNOSIS — M79671 Pain in right foot: Secondary | ICD-10-CM | POA: Diagnosis not present

## 2024-08-22 DIAGNOSIS — I739 Peripheral vascular disease, unspecified: Secondary | ICD-10-CM | POA: Diagnosis not present

## 2024-08-22 DIAGNOSIS — M79672 Pain in left foot: Secondary | ICD-10-CM | POA: Diagnosis not present

## 2024-08-22 DIAGNOSIS — L110 Acquired keratosis follicularis: Secondary | ICD-10-CM | POA: Diagnosis not present

## 2024-08-22 DIAGNOSIS — M79674 Pain in right toe(s): Secondary | ICD-10-CM | POA: Diagnosis not present

## 2024-08-22 DIAGNOSIS — M79675 Pain in left toe(s): Secondary | ICD-10-CM | POA: Diagnosis not present

## 2024-08-31 ENCOUNTER — Inpatient Hospital Stay: Payer: Self-pay | Admitting: Internal Medicine

## 2024-09-04 DIAGNOSIS — B356 Tinea cruris: Secondary | ICD-10-CM | POA: Diagnosis not present

## 2024-10-31 ENCOUNTER — Encounter: Payer: Self-pay | Admitting: Gastroenterology

## 2024-11-20 ENCOUNTER — Ambulatory Visit: Admitting: Urology

## 2024-11-20 ENCOUNTER — Encounter: Payer: Self-pay | Admitting: Urology

## 2024-11-20 VITALS — BP 115/74 | HR 105

## 2024-11-20 DIAGNOSIS — R972 Elevated prostate specific antigen [PSA]: Secondary | ICD-10-CM

## 2024-11-20 MED ORDER — FINASTERIDE 5 MG PO TABS: 5.0000 mg | ORAL_TABLET | Freq: Every day | ORAL | 3 refills | Status: AC

## 2024-11-20 NOTE — Progress Notes (Signed)
 11/20/2024 9:28 AM   Daniel Duffy 20-Jan-1951 [id number]  Referring provider: [facility] [address] [place],  [place] [address]  No chief complaint on file.   HPI:  F/u -    1) PSA elevation - a 10/18 PSA was 4.4 and recheck 3.03. His 06/20 PSA was 3.9 but 04/22 PSA 6.9, hematocrit 45, testosterone  1364 (he has a history of low testosterone . He doesn't recall or list T replacement). Prostate biopsy 06/22 benign with 53 gram prostate and a PSA of 5.7. His 12/22 PSA was 6.0 (psad 0.11), so we went ahead and set up a Jan 2023 prostate MRI which was benign (PIRADS 2) with a 47 g prostate. His PSA has increased Apr 2023 to 8.7. He thinks he had sex prior. His brother had PCa and he recalls what sounds like XRT. PSA recheck May 2023 was 5.5 with a normal DRE Apr 2023. His Oct 2023 PSA stable at 5.2. CT a/p Aug 2023 after fall revealed an 80 g prostate and no LAD or bone lesions. Jan 2025 Cr 1.12. Apr 2025 PSA 6.7.  Bx: Jun 2022 - benign, prostate 53 g    Staging: Jan 2023 pMRI - benign, prostate 53 g  Aug 2023 CT a/p - benign, prostate 80 g Sep 2025 CT a/p - benign, prostate 50 g    2)  BPH on tamsulosin . Prostate 50-80 g on imaging. PVR was 11.  Neurogenic risk includes spinal cord surgery.  He has some urgency and nocturia.  IPSS = 4-14. He takes tamsulosin .    3) ED - His prior SHIM was 10 and he tried sildenafil. He saw Dr. Cam in [facility] 2018 - 2019. He loses an erection too soon.      Today, seen for the above.  He was admitted to hospital September 2025 evidently with UTI and bacteremia.Sep 2025 CT a/p - benign. Prostate 50 g. No LAD or bone lesions. 11 mm hypodenisty left prostate (similar on Jul 2025, Aug 2023 CT). IPPS 9. No dysuria or gross hematuria. No specimen today.       PMH: Past Medical History:  Diagnosis Date   Abnormal weight loss    Acid reflux    Acute prostatitis    Acute sinusitis, unspecified    Chronic idiopathic  constipation    Colon polyps    Enlarged prostate    Hyperlipidemia, unspecified    Incisional hernia without obstruction or gangrene    Lumbago    Nonspecific elevation of levels of transaminase and lactic acid dehydrogenase (LDH)    Other male erectile dysfunction    Other obstructive and reflux uropathy    Radiculopathy, lumbar region    Rash and other nonspecific skin eruption    Reflux esophagitis     Surgical History: Past Surgical History:  Procedure Laterality Date   BIOPSY  04/05/2023   Procedure: BIOPSY;  Surgeon: Shaaron Lamar HERO, MD;  [facility];  Service: Endoscopy;;   COLONOSCOPY  12/29/2005   MFM:Pwuzmwjo hemorrhoids, anal papilla/Diminutive polyp on a stalk at 10 cm/otherwise normal rectum and colon, PATH: hyperplastic polyp   COLONOSCOPY N/A 04/10/2013   RMR: tubular adenoma: next TCS 04/2018   COLONOSCOPY WITH PROPOFOL  N/A 02/10/2018   Surgeon: Shaaron Lamar HERO, MD;  pancolonic diverticulosis, otherwise normal exam.  Recommended repeat colonoscopy in 5 years for surveillance due to history of colon polyps.   COLONOSCOPY WITH PROPOFOL  N/A 04/05/2023   Procedure: COLONOSCOPY WITH PROPOFOL ;  Surgeon: Shaaron Lamar  M, MD;  [facility];  Service: Endoscopy;  Laterality: N/A;  11:15 am   ESOPHAGOGASTRODUODENOSCOPY  12/29/2005   MFM:wnwrmpuprjo Schatzki's ring, small hiatal hernia, normal gastric mucosa, normal D1 and D2.    ESOPHAGOGASTRODUODENOSCOPY (EGD) WITH PROPOFOL  N/A 04/05/2023   Procedure: ESOPHAGOGASTRODUODENOSCOPY (EGD) WITH PROPOFOL ;  Surgeon: Shaaron Lamar HERO, MD;  [facility];  Service: Endoscopy;  Laterality: N/A;   HAND SURGERY     bilateral, got caught in a machine, two different occasions, right hand originally, than left hand. Left hand with skin graft   SPINAL CORD DECOMPRESSION      Home Medications:  Allergies as of 11/20/2024       Reactions   Nitrates, Organic Other (See Comments)   Could not control of body         Medication List        Accurate as of November 20, 2024  9:28 AM. If you have any questions, ask your nurse or doctor.          atorvastatin  40 MG tablet Commonly known as: LIPITOR Take 1 tablet (40 mg total) by mouth at bedtime.   brimonidine  0.2 % ophthalmic solution Commonly known as: ALPHAGAN  Place 1 drop into both eyes 2 (two) times daily.   ipratropium 0.03 % nasal spray Commonly known as: ATROVENT  Place 2 sprays into both nostrils 3 (three) times daily.   latanoprost  0.005 % ophthalmic solution Commonly known as: XALATAN  Place 1 drop into both eyes at bedtime.   levocetirizine 5 MG tablet Commonly known as: XYZAL  Take 5 mg by mouth daily.   multivitamins ther. w/minerals Tabs tablet Take 1 tablet by mouth daily.   omeprazole  40 MG capsule Commonly known as: PRILOSEC Take 1 capsule (40 mg total) by mouth daily.   polyethylene glycol 17 g packet Commonly known as: MiraLax  Take 17 g by mouth every Tuesday, Thursday, Saturday, and Sunday.   senna-docusate 8.6-50 MG tablet Commonly known as: Senokot-S Take 2 tablets by mouth at bedtime.   tamsulosin  0.4 MG Caps capsule Commonly known as: FLOMAX  Take 1 capsule (0.4 mg total) by mouth daily after supper.        Allergies: Allergies[1]  Family History: Family History  Problem Relation Age of Onset   CAD Brother    Colon polyps Brother     Social History:  reports that he has quit smoking. He has been exposed to tobacco smoke. He has never used smokeless tobacco. He reports that he does not drink alcohol and does not use drugs.   Physical Exam: BP 115/74   Pulse (!) 105   Constitutional:  Alert and oriented, No acute distress. HEENT: [place] AT, moist mucus membranes.  Trachea midline, no masses. Cardiovascular: No clubbing, cyanosis, or edema. Respiratory: Normal respiratory effort, no increased work of breathing. GI: Abdomen is soft, nontender, nondistended, no abdominal masses GU: No  CVA tenderness Lymph: No cervical or inguinal lymphadenopathy. Skin: No rashes, bruises or suspicious lesions. Neurologic: Grossly intact, no focal deficits, moving all 4 extremities. Psychiatric: Normal mood and affect.  Laboratory Data: Lab Results  Component Value Date   WBC 4.5 08/13/2024   HGB 11.4 (L) 08/13/2024   HCT 35.3 (L) 08/13/2024   MCV 85.9 08/13/2024   PLT 224 08/13/2024    Lab Results  Component Value Date   CREATININE 1.07 08/13/2024    Lab Results  Component Value Date   PSA 3.9 05/23/2019    No results found for: TESTOSTERONE   Lab  Results  Component Value Date   HGBA1C 6.2 07/12/2015    Urinalysis    Component Value Date/Time   COLORURINE YELLOW 08/07/2024 2305   APPEARANCEUR HAZY (A) 08/07/2024 2305   APPEARANCEUR Clear 05/08/2024 1339   LABSPEC 1.026 08/07/2024 2305   PHURINE 5.0 08/07/2024 2305   GLUCOSEU NEGATIVE 08/07/2024 2305   HGBUR MODERATE (A) 08/07/2024 2305   BILIRUBINUR NEGATIVE 08/07/2024 2305   BILIRUBINUR Negative 05/08/2024 1339   KETONESUR NEGATIVE 08/07/2024 2305   PROTEINUR 30 (A) 08/07/2024 2305   UROBILINOGEN 0.2 01/29/2014 2300   NITRITE POSITIVE (A) 08/07/2024 2305   LEUKOCYTESUR LARGE (A) 08/07/2024 2305    Lab Results  Component Value Date   LABMICR Comment 05/08/2024   WBCUA 0-5 09/21/2022   LABEPIT 0-10 09/21/2022   MUCUS Present 04/21/2021   BACTERIA RARE (A) 08/07/2024    Pertinent Imaging: Sep 2025 ct images    Assessment & Plan:    1. Elevated PSA (Primary) PSA was sent - Urinalysis, Routine w reflex microscopic; Future  2.  BPH-he is on tamsulosin .  We went over the nature risk benefits and alternatives to adding finasteride .  This could reduce his risk of urinary tract infection, and certainly reduce the risk of needing prostate surgery or urinary retention.  We went over FDA warnings among others.  All questions answered. He will start finasteride  and take with tamsulosin .   No follow-ups  on file.  Donnice Brooks, MD  [facility] [place]  [address] [place], [place] [address] [phone]      [1]  Allergies Allergen Reactions   Nitrates, Organic Other (See Comments)    Could not control of body

## 2024-11-21 ENCOUNTER — Ambulatory Visit: Payer: Self-pay

## 2024-11-21 LAB — PSA: Prostate Specific Ag, Serum: 5.7 ng/mL — ABNORMAL HIGH (ref 0.0–4.0)

## 2024-12-12 ENCOUNTER — Ambulatory Visit (INDEPENDENT_AMBULATORY_CARE_PROVIDER_SITE_OTHER): Admitting: Internal Medicine

## 2024-12-12 ENCOUNTER — Encounter: Payer: Self-pay | Admitting: Internal Medicine

## 2024-12-12 VITALS — BP 121/72 | HR 71 | Temp 98.5°F | Ht 71.0 in | Wt 156.4 lb

## 2024-12-12 DIAGNOSIS — K5900 Constipation, unspecified: Secondary | ICD-10-CM | POA: Diagnosis not present

## 2024-12-12 DIAGNOSIS — K219 Gastro-esophageal reflux disease without esophagitis: Secondary | ICD-10-CM

## 2024-12-12 DIAGNOSIS — R634 Abnormal weight loss: Secondary | ICD-10-CM

## 2024-12-12 DIAGNOSIS — Z860100 Personal history of colon polyps, unspecified: Secondary | ICD-10-CM

## 2024-12-12 MED ORDER — OMEPRAZOLE 40 MG PO CPDR: 40.0000 mg | DELAYED_RELEASE_CAPSULE | Freq: Two times a day (BID) | ORAL | 3 refills | Status: AC

## 2024-12-12 NOTE — Patient Instructions (Signed)
 Good to see you again today  Sounds like you are having some breakthrough acid reflux and indigestion in the evening.  For this, will increase your omeprazole  to 40 mg twice daily before breakfast and supper (dispense 180) with 3 additional refills  Continue taking MiraLAX  daily as needed for constipation  Plan for repeat colonoscopy 2031  Office visit here in 1 year and as needed

## 2024-12-12 NOTE — Progress Notes (Signed)
 "   Gastroenterology Progress Note    Primary Care Physician:  [facility] Primary Gastroenterologist:  Dr.   Pre-Procedure History & Physical: HPI:  Daniel Duffy is a 74 y.o. male here for follow-up of GERD and constipation.  Occasionally wakes up in the middle of the night with some breakthrough reflux symptoms 2-3 times a week on average.  De-escalate omeprazole  40 mg daily previously he takes it before breakfast.  No dysphagia.  Constipation well-controlled on MiraLAX .  Appetite is good weight is stable at 156 pounds  Past Medical History:  Diagnosis Date   Abnormal weight loss    Acid reflux    Acute prostatitis    Acute sinusitis, unspecified    Chronic idiopathic constipation    Colon polyps    Enlarged prostate    Hyperlipidemia, unspecified    Incisional hernia without obstruction or gangrene    Lumbago    Nonspecific elevation of levels of transaminase and lactic acid dehydrogenase (LDH)    Other male erectile dysfunction    Other obstructive and reflux uropathy    Radiculopathy, lumbar region    Rash and other nonspecific skin eruption    Reflux esophagitis     Past Surgical History:  Procedure Laterality Date   BIOPSY  04/05/2023   Procedure: BIOPSY;  Surgeon: Shaaron Lamar HERO, MD;  [facility];  Service: Endoscopy;;   COLONOSCOPY  12/29/2005   MFM:Pwuzmwjo hemorrhoids, anal papilla/Diminutive polyp on a stalk at 10 cm/otherwise normal rectum and colon, PATH: hyperplastic polyp   COLONOSCOPY N/A 04/10/2013   RMR: tubular adenoma: next TCS 04/2018   COLONOSCOPY WITH PROPOFOL  N/A 02/10/2018   Surgeon: Shaaron Lamar HERO, MD;  pancolonic diverticulosis, otherwise normal exam.  Recommended repeat colonoscopy in 5 years for surveillance due to history of colon polyps.   COLONOSCOPY WITH PROPOFOL  N/A 04/05/2023   Procedure: COLONOSCOPY WITH PROPOFOL ;  Surgeon: Shaaron Lamar HERO, MD;  [facility];  Service: Endoscopy;  Laterality:  N/A;  11:15 am   ESOPHAGOGASTRODUODENOSCOPY  12/29/2005   MFM:wnwrmpuprjo Schatzki's ring, small hiatal hernia, normal gastric mucosa, normal D1 and D2.    ESOPHAGOGASTRODUODENOSCOPY (EGD) WITH PROPOFOL  N/A 04/05/2023   Procedure: ESOPHAGOGASTRODUODENOSCOPY (EGD) WITH PROPOFOL ;  Surgeon: Shaaron Lamar HERO, MD;  [facility];  Service: Endoscopy;  Laterality: N/A;   HAND SURGERY     bilateral, got caught in a machine, two different occasions, right hand originally, than left hand. Left hand with skin graft   SPINAL CORD DECOMPRESSION      Prior to Admission medications  Medication Sig Start Date End Date Taking? Authorizing Provider  atorvastatin  (LIPITOR) 40 MG tablet Take 1 tablet (40 mg total) by mouth at bedtime. 08/14/24 11/26/25 Yes Emokpae, Courage, MD  brimonidine  (ALPHAGAN ) 0.2 % ophthalmic solution Place 1 drop into both eyes 2 (two) times daily. 12/14/23  Yes [provider]  finasteride  (PROSCAR ) 5 MG tablet Take 1 tablet (5 mg total) by mouth daily. 11/20/24  Yes Nieves Cough, MD  ipratropium (ATROVENT ) 0.03 % nasal spray Place 2 sprays into both nostrils 3 (three) times daily. 04/17/24  Yes [provider]  latanoprost  (XALATAN ) 0.005 % ophthalmic solution Place 1 drop into both eyes at bedtime. 12/15/23  Yes [provider]  levocetirizine (XYZAL ) 5 MG tablet Take 5 mg by mouth daily. 05/11/24  Yes [provider]  Multiple Vitamins-Minerals (MULTIVITAMINS THER. W/MINERALS) TABS Take 1 tablet by mouth daily.   Yes [provider]  omeprazole  (  PRILOSEC) 40 MG capsule Take 1 capsule (40 mg total) by mouth daily. 08/14/24  Yes Pearlean Manus, MD  polyethylene glycol (MIRALAX ) 17 g packet Take 17 g by mouth every Tuesday, Thursday, Saturday, and Sunday. 08/15/24  Yes Emokpae, Courage, MD  tamsulosin  (FLOMAX ) 0.4 MG CAPS capsule Take 1 capsule (0.4 mg total) by mouth daily after supper. 08/14/24  Yes Emokpae, Courage, MD   clotrimazole-betamethasone (LOTRISONE) cream Apply 1 Application topically 2 (two) times daily.    [provider]  meloxicam (MOBIC) 15 MG tablet Take 15 mg by mouth daily.    [provider]    Allergies as of 12/12/2024 - Review Complete 12/12/2024  Allergen Reaction Noted   Nitrates, organic Other (See Comments) 02/12/2022    Family History  Problem Relation Age of Onset   CAD Brother    Colon polyps Brother     Social History   Socioeconomic History   Marital status: Divorced    Spouse name: Not on file   Number of children: Not on file   Years of education: Not on file   Highest education level: Not on file  Occupational History   Not on file  Tobacco Use   Smoking status: Former    Passive exposure: Past   Smokeless tobacco: Never  Vaping Use   Vaping status: Never Used  Substance and Sexual Activity   Alcohol use: No   Drug use: No   Sexual activity: Never  Other Topics Concern   Not on file  Social History Narrative   Not on file   Social Drivers of Health   Tobacco Use: Medium Risk (12/12/2024)   Patient History    Smoking Tobacco Use: Former    Smokeless Tobacco Use: Never    Passive Exposure: Past  Physicist, Medical Strain: Not on file  Food Insecurity: No Food Insecurity (08/08/2024)   Epic    Worried About Programme Researcher, Broadcasting/film/video in the Last Year: Never true    Ran Out of Food in the Last Year: Never true  Transportation Needs: No Transportation Needs (08/08/2024)   Epic    Lack of Transportation (Medical): No    Lack of Transportation (Non-Medical): No  Physical Activity: Not on file  Stress: Not on file  Social Connections: Unknown (08/08/2024)   Social Connection and Isolation Panel    Frequency of Communication with Friends and Family: Twice a week    Frequency of Social Gatherings with Friends and Family: Once a week    Attends Religious Services: Patient declined    Database Administrator or Organizations: Patient declined     Attends Banker Meetings: Patient declined    Marital Status: Patient declined  Intimate Partner Violence: Not At Risk (08/08/2024)   Epic    Fear of Current or Ex-Partner: No    Emotionally Abused: No    Physically Abused: No    Sexually Abused: No  Depression (PHQ2-9): Not on file  Alcohol Screen: Not on file  Housing: Low Risk (08/08/2024)   Epic    Unable to Pay for Housing in the Last Year: No    Number of Times Moved in the Last Year: 0    Homeless in the Last Year: No  Utilities: Not At Risk (08/08/2024)   Epic    Threatened with loss of utilities: No  Health Literacy: Not on file    Review of Systems   See HPI, otherwise negative ROS  Physical Exam: BP 121/72  Pulse 71   Temp 98.5 F (36.9 C) (Oral)   Ht 5' 11 (1.803 m)   Wt 156 lb 6.4 oz (70.9 kg)   SpO2 96%   BMI 21.81 kg/m  General:   Alert,  Well-developed, well-nourished, pleasant and cooperative in NAD Lungs:  Clear throughout to auscultation.   No wheezes, crackles, or rhonchi. No acute distress. Heart:  Regular rate and rhythm; no murmurs, clicks, rubs,  or gallops. Abdomen: Non-distended, normal bowel sounds.  Soft and nontender without appreciable mass or hepatosplenomegaly.  Impression/Plan:   74 year old gentleman with GERD suboptimally controlled with nocturnal breakthrough.  No other alarm symptoms.  Need to increase his omeprazole  to twice daily  Constipation managed well with MiraLAX .  History of weight loss stabilized no active GI symptoms otherwise at this time.  Due for a surveillance colonoscopy 2031.  Recommendations:   Sounds like you are having some breakthrough acid reflux and indigestion in the evening.  For this, will increase your omeprazole  to 40 mg twice daily before breakfast and supper (dispense 180) with 3 additional refills  Continue taking MiraLAX  daily as needed for constipation  Plan for repeat colonoscopy 2031  Office visit here in 1 year and as  needed  Notice: This dictation was prepared with Dragon dictation along with smaller phrase technology. Any transcriptional errors that result from this process are unintentional and may not be corrected upon review.  "

## 2025-04-12 ENCOUNTER — Other Ambulatory Visit

## 2025-04-23 ENCOUNTER — Ambulatory Visit: Admitting: Urology
